# Patient Record
Sex: Female | Born: 1935 | Race: White | Hispanic: No | Smoking: Former smoker
Health system: Southern US, Community
[De-identification: ages and names within clinical notes are randomized; demographics above are authoritative.]

## PROBLEM LIST (undated history)

## (undated) DIAGNOSIS — I4891 Unspecified atrial fibrillation: Secondary | ICD-10-CM

---

## 2008-09-22 ENCOUNTER — Inpatient Hospital Stay (HOSPITAL_COMMUNITY): Admission: EM | Admit: 2008-09-22 | Discharge: 2008-09-28 | Payer: Self-pay | Admitting: Emergency Medicine

## 2008-09-26 ENCOUNTER — Encounter: Payer: Self-pay | Admitting: Orthopedic Surgery

## 2008-09-27 ENCOUNTER — Ambulatory Visit: Payer: Self-pay | Admitting: Physical Medicine & Rehabilitation

## 2008-09-28 ENCOUNTER — Inpatient Hospital Stay (HOSPITAL_COMMUNITY)
Admission: RE | Admit: 2008-09-28 | Discharge: 2008-10-12 | Payer: Self-pay | Admitting: Physical Medicine & Rehabilitation

## 2008-09-28 ENCOUNTER — Ambulatory Visit: Payer: Self-pay | Admitting: Physical Medicine & Rehabilitation

## 2010-11-22 LAB — PROTIME-INR
Prothrombin Time: 32.2 seconds — ABNORMAL HIGH (ref 11.6–15.2)
Prothrombin Time: 32.5 seconds — ABNORMAL HIGH (ref 11.6–15.2)

## 2010-11-27 LAB — CBC
HCT: 30 % — ABNORMAL LOW (ref 36.0–46.0)
HCT: 31.9 % — ABNORMAL LOW (ref 36.0–46.0)
HCT: 36.4 % (ref 36.0–46.0)
Hemoglobin: 10 g/dL — ABNORMAL LOW (ref 12.0–15.0)
Hemoglobin: 10.6 g/dL — ABNORMAL LOW (ref 12.0–15.0)
Hemoglobin: 11.8 g/dL — ABNORMAL LOW (ref 12.0–15.0)
Hemoglobin: 9.6 g/dL — ABNORMAL LOW (ref 12.0–15.0)
Hemoglobin: 9.6 g/dL — ABNORMAL LOW (ref 12.0–15.0)
Hemoglobin: 9.6 g/dL — ABNORMAL LOW (ref 12.0–15.0)
MCHC: 32.8 g/dL (ref 30.0–36.0)
MCHC: 33.2 g/dL (ref 30.0–36.0)
MCHC: 33.4 g/dL (ref 30.0–36.0)
MCHC: 34.1 g/dL (ref 30.0–36.0)
MCV: 78.5 fL (ref 78.0–100.0)
MCV: 78.6 fL (ref 78.0–100.0)
MCV: 78.9 fL (ref 78.0–100.0)
MCV: 79 fL (ref 78.0–100.0)
MCV: 79.5 fL (ref 78.0–100.0)
MCV: 80.2 fL (ref 78.0–100.0)
Platelets: 179 10*3/uL (ref 150–400)
Platelets: 211 10*3/uL (ref 150–400)
Platelets: 265 10*3/uL (ref 150–400)
RBC: 3.76 MIL/uL — ABNORMAL LOW (ref 3.87–5.11)
RBC: 3.8 MIL/uL — ABNORMAL LOW (ref 3.87–5.11)
RBC: 3.88 MIL/uL (ref 3.87–5.11)
RBC: 3.57 MIL/uL — ABNORMAL LOW (ref 3.87–5.11)
RBC: 3.58 MIL/uL — ABNORMAL LOW (ref 3.87–5.11)
RBC: 3.71 MIL/uL — ABNORMAL LOW (ref 3.87–5.11)
RDW: 19 % — ABNORMAL HIGH (ref 11.5–15.5)
RDW: 19.6 % — ABNORMAL HIGH (ref 11.5–15.5)
RDW: 19.9 % — ABNORMAL HIGH (ref 11.5–15.5)
WBC: 7 10*3/uL (ref 4.0–10.5)
WBC: 7 10*3/uL (ref 4.0–10.5)
WBC: 7 10*3/uL (ref 4.0–10.5)
WBC: 7.1 10*3/uL (ref 4.0–10.5)
WBC: 7.7 10*3/uL (ref 4.0–10.5)
WBC: 7.8 10*3/uL (ref 4.0–10.5)

## 2010-11-27 LAB — BASIC METABOLIC PANEL
BUN: 10 mg/dL (ref 6–23)
CO2: 32 mEq/L (ref 19–32)
Calcium: 8.9 mg/dL (ref 8.4–10.5)
Calcium: 9 mg/dL (ref 8.4–10.5)
Chloride: 100 mEq/L (ref 96–112)
Chloride: 102 mEq/L (ref 96–112)
Chloride: 102 mEq/L (ref 96–112)
Chloride: 100 mEq/L (ref 96–112)
Creatinine, Ser: 0.83 mg/dL (ref 0.4–1.2)
Creatinine, Ser: 0.86 mg/dL (ref 0.4–1.2)
Creatinine, Ser: 0.79 mg/dL (ref 0.4–1.2)
Creatinine, Ser: 0.84 mg/dL (ref 0.4–1.2)
GFR calc Af Amer: >60 mL/min (ref >60)
GFR calc Af Amer: >60 mL/min (ref >60)
GFR calc Af Amer: >60 mL/min (ref >60)
GFR calc non Af Amer: 53 mL/min — ABNORMAL LOW (ref >60)
GFR calc non Af Amer: >60 mL/min (ref >60)
GFR calc non Af Amer: >60 mL/min (ref >60)
Glucose, Bld: 130 mg/dL — ABNORMAL HIGH (ref 70–99)
Potassium: 4.1 mEq/L (ref 3.5–5.1)
Potassium: 4.3 mEq/L (ref 3.5–5.1)
Sodium: 135 mEq/L (ref 135–145)
Sodium: 137 mEq/L (ref 135–145)
Sodium: 138 mEq/L (ref 135–145)

## 2010-11-27 LAB — PROTIME-INR
INR: 1.3 (ref 0.00–1.49)
INR: 2 — ABNORMAL HIGH (ref 0.00–1.49)
INR: 1.2 (ref 0.00–1.49)
INR: 1.3 (ref 0.00–1.49)
INR: 1.4 (ref 0.00–1.49)
INR: 1.5 (ref 0.00–1.49)
INR: 1.9 — ABNORMAL HIGH (ref 0.00–1.49)
INR: 2 — ABNORMAL HIGH (ref 0.00–1.49)
INR: 2.4 — ABNORMAL HIGH (ref 0.00–1.49)
INR: 2.7 — ABNORMAL HIGH (ref 0.00–1.49)
INR: 2.8 — ABNORMAL HIGH (ref 0.00–1.49)
Prothrombin Time: 16.6 seconds — ABNORMAL HIGH (ref 11.6–15.2)
Prothrombin Time: 16.4 seconds — ABNORMAL HIGH (ref 11.6–15.2)
Prothrombin Time: 17.5 seconds — ABNORMAL HIGH (ref 11.6–15.2)
Prothrombin Time: 23.7 seconds — ABNORMAL HIGH (ref 11.6–15.2)
Prothrombin Time: 23.9 seconds — ABNORMAL HIGH (ref 11.6–15.2)
Prothrombin Time: 27.3 seconds — ABNORMAL HIGH (ref 11.6–15.2)
Prothrombin Time: 30.9 seconds — ABNORMAL HIGH (ref 11.6–15.2)

## 2010-11-27 LAB — HEPARIN LEVEL (UNFRACTIONATED)
Heparin Unfractionated: 0.36 IU/mL (ref 0.30–0.70)
Heparin Unfractionated: 0.38 IU/mL (ref 0.30–0.70)
Heparin Unfractionated: <0.1 IU/mL — ABNORMAL LOW (ref 0.30–0.70)

## 2010-11-27 LAB — URINALYSIS, ROUTINE W REFLEX MICROSCOPIC
Glucose, UA: NEGATIVE mg/dL
Glucose, UA: NEGATIVE mg/dL
Ketones, ur: NEGATIVE mg/dL
Nitrite: NEGATIVE
Nitrite: POSITIVE — AB
Specific Gravity, Urine: 1.011 (ref 1.005–1.030)
Specific Gravity, Urine: 1.014 (ref 1.005–1.030)
pH: 5.5 (ref 5.0–8.0)
pH: 6 (ref 5.0–8.0)

## 2010-11-27 LAB — DIFFERENTIAL
Basophils Absolute: 0 10*3/uL (ref 0.0–0.1)
Basophils Absolute: 0.1 10*3/uL (ref 0.0–0.1)
Basophils Relative: 1 % (ref 0–1)
Eosinophils Relative: 1 % (ref 0–5)
Eosinophils Relative: 4 % (ref 0–5)
Lymphocytes Relative: 12 % (ref 12–46)
Lymphocytes Relative: 17 % (ref 12–46)
Lymphs Abs: 0.9 10*3/uL (ref 0.7–4.0)
Lymphs Abs: 0.9 10*3/uL (ref 0.7–4.0)
Lymphs Abs: 1.2 10*3/uL (ref 0.7–4.0)
Monocytes Absolute: 0.5 10*3/uL (ref 0.1–1.0)
Monocytes Absolute: 0.7 10*3/uL (ref 0.1–1.0)
Monocytes Absolute: 0.7 10*3/uL (ref 0.1–1.0)
Monocytes Relative: 10 % (ref 3–12)
Monocytes Relative: 9 % (ref 3–12)
Neutro Abs: 5.2 10*3/uL (ref 1.7–7.7)
Neutro Abs: 6.1 10*3/uL (ref 1.7–7.7)
Neutrophils Relative %: 74 % (ref 43–77)
Neutrophils Relative %: 79 % — ABNORMAL HIGH (ref 43–77)

## 2010-11-27 LAB — COMPREHENSIVE METABOLIC PANEL
AST: 20 U/L (ref 0–37)
Alkaline Phosphatase: 64 U/L (ref 39–117)
BUN: 10 mg/dL (ref 6–23)
CO2: 27 mEq/L (ref 19–32)
Calcium: 8.7 mg/dL (ref 8.4–10.5)
Chloride: 106 mEq/L (ref 96–112)
Creatinine, Ser: 0.82 mg/dL (ref 0.4–1.2)
GFR calc Af Amer: >60 mL/min (ref >60)
GFR calc non Af Amer: >60 mL/min (ref >60)
Glucose, Bld: 128 mg/dL — ABNORMAL HIGH (ref 70–99)
Potassium: 4.3 mEq/L (ref 3.5–5.1)
Total Bilirubin: 0.6 mg/dL (ref 0.3–1.2)
Total Protein: 6.1 g/dL (ref 6.0–8.3)

## 2010-11-27 LAB — POCT I-STAT 4, (NA,K, GLUC, HGB,HCT)
Glucose, Bld: 245 mg/dL — ABNORMAL HIGH (ref 70–99)
HCT: 32 % — ABNORMAL LOW (ref 36.0–46.0)
Hemoglobin: 10.9 g/dL — ABNORMAL LOW (ref 12.0–15.0)
Potassium: 3.6 mEq/L (ref 3.5–5.1)

## 2010-11-27 LAB — URINE MICROSCOPIC-ADD ON

## 2010-11-27 LAB — APTT: aPTT: 28 seconds (ref 24–37)

## 2010-11-27 LAB — TSH: TSH: 3.174 u[IU]/mL (ref 0.350–4.500)

## 2010-12-25 NOTE — Discharge Summary (Signed)
NAMEMarland Kitchen  Sara Merritt, Sara Merritt NO.:  [id number]   MEDICAL RECORD NO.:  [id number]          PATIENT TYPE:  INP   LOCATION:  2029                         FACILITY:  [facility]   PHYSICIAN:  Sara Merritt, M.D. DATE OF BIRTH:  1930-01-10   DATE OF ADMISSION:  09/25/2008  DATE OF DISCHARGE:  09/28/2008                               DISCHARGE SUMMARY   DISCHARGE DIAGNOSES:  1. Right trimalleolar equivalent ankle fracture.  2. Ruptured syndesmosis.  3. Possible evulsion posterior left calcaneus inferior aspect without      any additional evidence of left Merritt or ankle injury.  4. Urinary tract infection, cultures pending.   ADDITIONAL DISCHARGE DIAGNOSES:  1. Breast cancer, status post left breast lumpectomy and node      dissection, July 2009. Has had radiation therapy of 32 treatments.  2. Congestive heart failure.  3. Atrial fibrillation with chronic anticoagulation therapy.  4. Dyslipidemia.  5. Hypothyroidism.   PROCEDURE PERFORMED:  1. On 09/26/2008; ORIF with trimalleolar equivalent without fixation      of posterior lip.  2. ORIF right syndesmosis.   BRIEF HISTORY AND HOSPITAL COURSE:  [name] is a very pleasant 77-  year-old female who was originally admitted to the hospital on  09/22/2008 after she sustained a fall when she was attempting to get  into her Sara Merritt at her home when she twisted her right leg which resulted  in a right ankle fracture.  She was brought [facility]  for evaluation which did demonstrate a fracture dislocation of her right  ankle, so she was admitted for opened reduction and internal fixation.  However, given the fact that [name] is on anticoagulation therapy  her INR was significantly elevated on admission with a level of 2.4.  She was admitted and Medicine was consulted to help manage her medical  issues as well as to assist with decreasing the INR to less than 1.5 to  permit safe surgery.  Prior to admission, Ms.  Sara Merritt did undergo closed  reduction in the emergency room of her right ankle dislocation which she  tolerated well.  She was then splinted and admitted to the hospital.  Over the next several days [name] was followed very closely.  Her  INR was followed.  Eventually on hospital day number 4 her INR was at a  reasonable level.  She was permit safe surgery.  On 09/24/2008 her INR  was 1.3.  [name] was covered with heparin to prevent clot as well as  a-fib until surgery could be arranged.  On 09/26/2008 Ms. Nesler was  transferred to [facility] and underwent the procedure described  up above.  [name] tolerated the procedure very well; however, given  her cardiac history we did decide to have her placed postoperatively in  the telemetry unit for continued observation and management of her pain.  [name] hospital stay postoperatively was relatively uncomplicated.  Her heparin was restarted 10 p.m. the night after surgery without  complications.  On postoperative day number 1 it was determined  anticoagulation therapy would be carried out with  Lovenox serving as a  bridge to Coumadin as well.  During her hospital stay [name] was  followed by [facility] which we do greatly appreciate their  assistance with the management of her medical condition.  [name] was  doing very well on postoperative day number 1.  In addition, we did  obtained an inpatient medicine rehab consultation which did demonstrate  that she was a candidate for inpatient rehab and as such the patient  agreed with these findings.  On postoperative day 2 [name] was doing  extremely well, no complaints whatsoever.  The pain in right ankle was  very well controlled on p.o. pain medication.  Her only complaint was  the fact that she did have cam boot on her left Merritt from suspected  injury to her calcaneus on admission.  Clinical encounter __________  postoperative day number 2 is as  follows; subjective/objective the  patient is doing great, complaining of the boot on her left Merritt, no  pain.  The patient is ready to go to rehab.  She is tolerating p.o.  well.  No new complaints are noted.  Vital signs; temperature 98.1,  heart rate 82, respirations 18, and 99% on room air, blood pressure is  115/76.  Laboratory: Sodium 138, potassium 4.5, chloride 100, bicarb 32,  BUN 14, creatinine 0.84, glucose 128.  White blood cells 7.8, hemoglobin  9.6, hematocrit 28.6, platelets 221, INR 1.2.  Her UA did have positive  nitrites and large amount of leukocytes.  General; the patient is awake,  alert, in no acute distress, is comfortable, very pleasant, and is  joking around.  Lungs are clear to auscultation bilaterally. Cardiac;  irregularly irregular.  Abdomen is soft and nontender, positive bowel  sounds.  Right lower extremity splint and dressing was clean, dry, and  intact.  Deep tendon peroneal nerve, superficial peroneal nerve, and  tibial nerve __________  function are intact.  EHL and FHL and motor  functions are intact grossly as well.  No pain with passive motion.  It  does appear that the splint is fitting well.  No evidence of skin  irritation or lesions of the skin both proximally and distally.  Extremities are warm.  Capillary refill is not assessed secondary to the  patient having nails polished.   ASSESSMENT AND PLAN:  This is a 77 year old female status post ORIF,  right ankle fracture, trimalleolar equivalent with ruptured syndesmosis.  1. Weightbearing right lower extremity.  2. Lovenox and Coumadin for DVT prophylaxis and for a-fib.  3. Continue PT OT.  4. We will discharged the patient to rehab today.  5. Medical issues are stable at this time.  Greatly appreciate      Medicine assistance.  6. Follow up in 10 to 14 days.  The patient will most likely required      a short leg cast.  7. Urinary tract infection, Cipro 500 mg p.o. b.i.d. x5 days.    DISCHARGE MEDICATIONS:  1. Norco 5/325 one to two p.o. q.4-6 hours as needed for pain.  2. Arimidex 1 mg at bedtime.  3. Diltiazem CD 240 mg p.o. at bedtime.  4. Furosemide 40 mg p.o. p.r.n.  as needed.  5. Levothyroxine 25 mcg p.o. daily.  6. Robaxin 500 mg p.o. q.6 hours p.r.n.  7. Metoprolol 50 mg p.o. b.i.d.  8. Protonix 40 mg p.o. daily.  9. Simvastatin p.o. at bedtime.  10.Lovenox per pharmacy protocol for DVT prophylaxis and a-fib, should  be at a therapeutic dose, a weight base, and Coumadin per pharmacy      protocol for DVT prophylaxis and a-fib.   DISCHARGE INSTRUCTIONS:  [name] did sustained a significant injury  to her right ankle and it did appear that her medical issues had been  controlled while she has been in the hospital with close monitoring from  the Medicine Service which we greatly appreciate.  [name] will be  nonweightbearing on her right lower extremity for the next 8 weeks or so  with gradual weightbearing thereafter.  After discussion at length with  the patient and her family members I am quite concern that she may be  somewhat not compliant on maintaining her nonweightbearing restrictions,  and as such I will most likely transition [name] to a cast once her  wounds are stabilized.  [name] will be discharged to inpatient rehab  today and she will continue to participate daily with physical therapy,  occupational therapy, and other therapeutic activities as necessary.  I  would anticipate that upon discharge from the inpatient rehabilitation  she will be fairly mobile and may still require some home health and PT  but this will be determined prior to discharge. Again, [name] will  remain on Lovenox and Coumadin, both serving for DVT prophylaxis as well  as for her history of a-fib.  Lovenox will serve as a bridge until her  Coumadin is at the therapeutic level.  [name] will follow up in our  office in about 10 to 14 days at which  time we will remove her splint,  evaluate her surgical wounds, remove the sutures, obtained x-rays to  evaluate her healing and hardware placement and then place her in a  short-leg cast while she continues to remain nonweightbearing.  Should  the inpatient rehab doctors have any questions they can feel free to  contact us as well.  In addition, [name]'s UA on discharge did  demonstrate signs of urinary tract infection, as such we will start her  on Cipro 100 mg p.o. b.i.d. x5 days for treatment.  I feel that this  should efficiently treat the UTI.      Mearl Latin, PA    ______________________________  Sara Homer Sherlean Merritt, M.D.    KWP/MEDQ  D:  09/28/2008  T:  09/28/2008  Job:  [id number]   cc:   Thereasa Distance A. Chaney Malling, M.D.  Oswald Hillock, MD  Jonah Blue, M.D.

## 2010-12-25 NOTE — Op Note (Signed)
NAME:  Sara Merritt, Sara Merritt             ACCOUNT NO.:  [id number]   MEDICAL RECORD NO.:  [id number]          PATIENT TYPE:  INP   LOCATION:  0101                         FACILITY:  [facility]   PHYSICIAN:  Lenard Galloway. Mortenson, M.D.DATE OF BIRTH:  12-30-1929   DATE OF PROCEDURE:  DATE OF DISCHARGE:                               OPERATIVE REPORT   HISTORY:  A very pleasant 77 year old white female who was trying to get  into a [employer] this evening at about 6 o'clock and twisted her right leg,  sustaining a deformity.  Brought to the emergency room at [facility] where she underwent x-rays revealing a fracture dislocation of  the right ankle with marked deformity.  Consultation was asked for by  Dr. Radford Pax and the patient was seen and evaluated.  She had an obvious  external rotation and posterior subluxation of the right ankle requiring  closed reduction.  Discussion with Dr. Radford Pax, he would provide  conscious sedation using etomidate for this procedure.   PROCEDURE NOTE:  The patient was in the emergency room, placed supine  and with oxygen, resuscitation cart, and suction available and full EKG  and pulse oxymetry, Dr. Radford Pax provided relaxation and conscious  sedation using etomidate.  Once he was satisfied with sedation, the  ankle was then reduced by bringing the knee into 90 degrees of flexion  and the posterior subluxation and was reduced and the patient's foot was  internally rotated.  She did not have any problems with this.  Ortho  tech came to splint the patient while it was being held.  Kerlix fluffs  were placed anterior and posterior and the patient was then placed into  a sugar tong splint and posterior splint.  Preoperatively, she had good  sensation and dorsalis pedis pulse, and postreduction, she had 1 to 2+  dorsalis pedis pulse with full sensation and motor.  She awoke on her  own without any difficulties, tolerating the procedure well.      Oris Drone Petrarca,  P.A.-C.      Rodney A. Chaney Malling, M.D.     BDP/MEDQ  D:  09/22/2008  T:  09/22/2008  Job:  [id number]

## 2010-12-25 NOTE — Discharge Summary (Signed)
NAMEMarland Merritt  DEBHORA, [name]:  [id number]   MEDICAL RECORD NO.:  [id number]          PATIENT TYPE:  IPS   LOCATION:  4032                         FACILITY:  [facility]   PHYSICIAN:  Erick Colace, M.D.DATE OF BIRTH:  [birth year]   DATE OF ADMISSION:  09/28/2008  DATE OF DISCHARGE:  10/12/2008                               DISCHARGE SUMMARY   DISCHARGE DIAGNOSES:  1. Right fibular - lateral distal tibia fracture with open reduction      and external fixation September 26, 2008.  2. Left ankle sprain.  3. Chronic atrial fibrillation with Coumadin therapy.  4. Anemia.  5. Hypertension.  6. Hypothyroidism.  7. Hyperlipidemia.  8. Left breast cancer.  9. Congestive heart failure.   This is a 77 year old white female with history of chronic atrial  fibrillation on Coumadin therapy.  He was admitted on September 22, 2008,  after a fall.  No loss of consciousness when she twisted her right leg.  X-ray showed dislocation fracture, right ankle.  INR on admission 2.5  and received vitamin K.  Placed on intravenous heparin.  Underwent open  reduction and external fixation and repair of ruptured syndesmosis on  September 26, 2008, per Dr. Carola Frost.  Nonweightbearing, right lower  extremity.  Heparin changed to subcutaneous Lovenox with Coumadin  resumed September 27, 2008.  Postoperative anemia 9.6 and monitored.  Pain management with Vicodin and Robaxin.  A walking boot was applied to  left lower extremity secondary to ankle sprain with soft tissue swelling  noted.  The patient was admitted for comprehensive rehab program.   PAST MEDICAL HISTORY:  See discharge diagnoses.  Remote smoker.  Occasional alcohol.   ALLERGIES:  None.   SOCIAL HISTORY:  Lives alone in [place].  Local family works  except one daughter that can provide supervision - minimal assistance.  One-level home, seven steps to entry with a plan for a ramp.  The  patient is retired from [employer].   FUNCTIONAL HISTORY:  Prior to admission was independent driving.   FUNCTIONAL STATUS:  Upon admission to rehab services was minimum to  moderate assist mobility.   MEDICATIONS:  Prior to admission were:  1. Coumadin daily.  2. Arimidex 1 mg daily.  3. Cardizem 240 mg daily.  4. Lasix 40 mg as needed for edema.  5. Potassium chloride when Lasix taken.  6. Synthroid 25 mcg daily.  7. Metoprolol 25 mg twice daily.  8. Prilosec 20 mg daily.  9. Zocor 20 mg daily.   PHYSICAL EXAMINATION:  VITAL SIGNS:  Blood pressure 115/76, pulse 82,  temperature 98.1, and respirations 18.  GENERAL:  This is an alert female in no acute distress, oriented x3.  LUNGS:  Clear to auscultation.  CARDIAC:  Rate controlled.  ABDOMEN:  Soft and nontender.  Good bowel sounds.  EXTREMITIES:  Calves remained cool without any swelling or erythema,  nontender.  Sensation intact to light touch. She had a walking boot to  left lower extremity and a bulky cast to the right lower extremity with  neurovascular sensation intact.   REHABILITATION HOSPITAL COURSE:  The patient was admitted to inpatient  rehab services with therapies initiated on a 3-hour daily basis  consisting of physical therapy, occupational therapy, and rehabilitation  nursing.  The following issues were addressed during the patient's  rehabilitation stay.  Pertaining to Ms. Payes's right fibular, lateral,  distal tibia fracture, she had undergone open reduction and external  fixation on September 26, 2008.  She was nonweightbearing to right lower  extremity.  Plan would be to follow with Dr. Carola Frost for a permanent cast  as an outpatient.  She continued with an air cast splint to left lower  extremity for ankle sprain.  She initially was with walking boot.  She  is weightbearing as tolerated.  This did help overall to aid in her  functional mobility.  She remained on chronic Coumadin for atrial  fibrillation with cardiac rate  controlled.  She was followed by Dr.  Joetta Manners at [phone].  A home health nurse had been arranged for  Coumadin therapy.  Her latest INR on October 10, 2008, was 2.9.  She had no  bleeding episodes.  Postoperative anemia remained stable with latest  hemoglobin 10.5, hematocrit 31.6.  Blood pressures monitored with  metoprolol, Cardizem, again with rate controlled.  She remained on  hormone supplement for hypothyroidism.  She had a history of  hyperlipidemia.  She remained on Zocor without issue.  She had a history  of left breast cancer.  She had undergone left lumpectomy as well as  radiation therapy in July 2009.  This was without issue during her  rehabilitation stay.  She exhibited no signs of fluid overload  throughout her rehab stay.  She was voiding without difficulty.  Weekly  collaborative interdisciplinary team conferences were held to discuss  the patient's estimated length of stay, ongoing family teaching, and any  barriers to discharge.  She was overall minimal assist to supervision  for wheelchair mobility, supervision bathing at bed level, minimal  assist lower body dressing, moderate assist toilet transfers, minimal  assist for toileting, no bowel or bladder disturbances.  She was able to  communicate her needs.  Her mobility remained somewhat limited due to  the fact she was nonweightbearing on her right lower extremity and  recent left ankle sprain with air cast in place.  She was discharged to  home with ongoing home therapies.  Latest labs on October 10, 2008, showed  an INR of 2.9.  Latest hemoglobin 10.5, hematocrit of 31.6.  Chemistries  with a sodium 139, potassium 3.6, BUN 11, and creatinine 0.8.  She was  discharged to home with family.   DISCHARGE MEDICATIONS:  At the time of dictation included:  1. Coumadin with latest dose of 2.5 mg adjusted accordingly for an INR      of 2.0-3.0.  2. Zocor 20 mg at bedtime.  3. Arimidex 1 mg at bedtime.  4. Cardizem CD 240  mg daily.  5. Synthroid 25 mcg daily.  6. Lopressor 50 mg twice daily.  7. Protonix 40 mg daily.  8. Vicodin 5/325 one or two tablets every 4 hours as needed pain,      dispense of 90 tablets.  9. Robaxin 500 mg every 6 hours as needed for spasms.   DIET:  Regular.   SPECIAL INSTRUCTIONS:  Non-weightbearing to right leg.  Follow up Dr.  Carola Frost for application of permanent cast as an outpatient.  Air cast  splint to left lower extremity weightbearing as tolerated.  Home health  nurse  to check INR on Friday October 14, 2008, results to Dr. Joetta Manners  at [phone], fax number [phone].  She should follow up with Dr. Carola Frost,  call office for appointment.  Dr. Joetta Manners is ongoing medical  management.  Dr. Claudette Laws as needed rehab services.      Mariam Dollar, P.A.      Erick Colace, M.D.  Electronically Signed    DA/MEDQ  D:  10/11/2008  T:  10/11/2008  Job:  [id number]   cc:   Erick Colace, M.D.  Raynelle Jan, M.D.  Doralee Albino. Carola Frost, M.D.

## 2010-12-25 NOTE — Consult Note (Signed)
NAME:  MARGO, LAMA NO.:  [id number]   MEDICAL RECORD NO.:  [id number]          PATIENT TYPE:  INP   LOCATION:  1409                         FACILITY:  [facility]   PHYSICIAN:  Oswald Hillock, MD        DATE OF BIRTH:  1930/08/05   DATE OF CONSULTATION:  09/22/2008  DATE OF DISCHARGE:                                 CONSULTATION   REASON FOR CONSULTATION:  Planned open reduction and internal fixation,  of the right ankle.   CHIEF COMPLAINT/HISTORY OF PRESENT ILLNESS:  The patient is 77 year old  Caucasian female who presented the emergency room after she fell  twisting her right leg while getting into a van at her home.  She was  noted to have a significant deformity of the right ankle.  Initial  evaluation in the ER revealed fracture dislocation of the right ankle.  The patient was evaluated by Surgery and determined to need open  reduction and internal fixation.  We were consulted to clear her for  surgery and reverse her anticoagulation as she is on Coumadin.  At the  time of this interview, the patient denies any chest pain, shortness of  breath, palpitations, dizziness, diaphoresis, loss of consciousness, or  any focal weakness of any part of the body.  She does, however, have  significant pain in the right lower extremity at the time of this  interview.   PAST MEDICAL HISTORY:  Significant for:  1. Atrial fibrillation.  2. Congestive heart failure.  3. History of left breast cancer, status post lumpectomy and node      dissection and radiation therapy in July of 2009.   PAST SURGICAL HISTORY:  Left breast lumpectomy.   CURRENT MEDICATIONS:  Include:  1. Coumadin.  2. Arimidex.  3. Diltiazem 240 mg.  4. Lasix p.r.n.  5. Potassium p.r.n.  6. Levothyroxine 25 mcg daily.  7. Metoprolol 25 mg b.i.d.  8. Omeprazole 20 mg daily.  9. Simvastatin 20 mg q.h.s.   REVIEW OF SYSTEMS:  An extensive review of systems is done.  All systems  are negative except for  the positives mentioned in the history of  present illness.   ALLERGIES:  No known drug allergies.   SOCIAL HISTORY:  The patient quit smoking greater than 30 years ago, had  about a 20-pack year history of smoking prior to that.  Drinks alcohol  socially.  No history of drug use.  She is independent in all her ADLs  at baseline.   PHYSICAL EXAMINATION:  VITAL SIGNS: Vitals on admission, pulse 112,  blood pressure 103/72, respiratory rate 14, and temperature 98.5.  GENERAL: An elderly Caucasian female in no acute distress, alert and  oriented to time, place, and person.  HEENT: No scleral icterus.  No pallor.  Ears negative.  Poor dental  hygiene.  NECK: Supple.  No lymphadenopathy.  No JVD.  CHEST: Breath sounds heard bilaterally.  Good air entry.  Occasional  rhonchi.  CVS: S1 and S2 plus.  Regular.  No gallop or rub.  Positive systolic  murmur.  ABDOMEN: Soft, nontender, and nondistended.  Bowel sounds are present.  EXTREMITIES: Right lower extremity is in a splint at the time of this  examination.  No edema in the left lower extremity.  NEUROLOGIC: Cranial nerves II through XII appear grossly intact.  No  focal motor or sensory deficits noted on gross examination.   LABORATORY DATA:  Her sodium was 135, potassium 4.3, chloride 102, CO2  25, glucose 130, BUN 14, and creatinine 1.01.  Calcium of 9.  Her WBC  count is 7.5, hemoglobin 11.8, hematocrit 36.4, platelet count of  222,000.  Her PT is 27.9 and INR 2.4.  Chest x-ray shows no acute  process.  X-ray of the right ankle showed fracture dislocation of the  ankle with posterior subluxation of the talus.  EKG pending.   IMPRESSION AND PLAN:  This is the case of a 77 year old Caucasian female  with history of atrial fibrillation, congestive heart failure, and  breast cancer who presents status post fall with her right ankle  fracture necessitating an open reduction and internal fixation.  1. Preoperative clearance.  Given the  patient's multiple medical      problems including atrial fibrillation, congestive heart failure,      and her age, she is moderate to high risk for any surgical      procedure.  A major issue with her is her anticoagulation, which we      need to reverse at this point of time.  As she has received her      Coumadin dose today, we will go ahead and give her a low dose of      vitamin K to have her PT/INR in the acceptable range for possible      surgical procedure in the morning.  She will need bridging with      either heparin or subcutaneous Lovenox during the perioperative      period.  We will review her INR in the morning prior to deciding      that.  In case her INR is still elevated in the morning, there is      always option of using frozen plasma to go ahead with the surgical      procedure.  We will need to get an EKG on her prior to any surgical      clearance.  Perioperative beta blockers will be continued and she      will need to be continued on the calcium channel blocker as well,      as it is a pretty significant dose and any discontinuation at this      time may lead to rapid ventricular response.  We will also check a      BNP as she does have history of congestive heart failure.  She uses      Lasix only on a p.r.n. basis; therefore, fluid management will need      to be monitored closely and Lasix needed with any blood      transfusion.  Her electrolytes and renal function is within normal      limits and we will continue to monitor that as well.  The patient      will need early ambulation postop and incentive spirometry.  2. Atrial fibrillation.  Rate is in the 110's at this time.  We will      go ahead and give her the dose of beta blocker that she is      scheduled and monitor her rate as mentioned  above and EKG will need      to be done and followed up on.  3. Congestive heart failure, compensated.  We will check a BNP and      follow up with the results.  Use  Lasix p.r.n.  4. Deep venous thrombosis/gastrointestinal prophylaxis.  Protonix and      compression devices for now.  However, the patient will need      anticoagulation maintained in the perioperative and postoperative      stage.      Oswald Hillock, MD  Electronically Signed     BA/MEDQ  D:  09/22/2008  T:  09/23/2008  Job:  [id number]   cc:   Primary Care Physician  Rosalita Levan, [place]   Orthopedic Physician  [place], [place]

## 2010-12-25 NOTE — Op Note (Signed)
NAME:  CANDENCE, SEASE NO.:  [id number]   MEDICAL RECORD NO.:  [id number]           PATIENT TYPE:   LOCATION:                                 FACILITY:   PHYSICIAN:  Doralee Albino. Carola Frost, M.D. DATE OF BIRTH:  11-Nov-1929   DATE OF PROCEDURE:  09/26/2008  DATE OF DISCHARGE:                               OPERATIVE REPORT   PREOPERATIVE DIAGNOSES:  1. Right trimalleolar equivalent fracture.  2. Ruptured syndesmosis.   POSTOPERATIVE DIAGNOSES:  1. Right trimalleolar equivalent fracture.  2. Ruptured syndesmosis.   PROCEDURES:  1. Repair of trimalleolar equivalent without fixation of posterior      lip.  2. ORIF of syndesmosis.   SURGEON:  Doralee Albino. Carola Frost, MD   ASSISTANTS:  1. Mearl Latin, PA  2. Mark C. Ophelia Charter, MD   ANESTHESIA:  General.   COMPLICATIONS:  None.   TOTAL TOURNIQUET TIME:  None.   DISPOSITION:  PACU.   CONDITION:  Stable.   BRIEF SUMMARY AND INDICATIONS FOR PROCEDURE:  Carolene Gitto is a 77-  year-old female who sustained a right ankle fracture getting out her  Zenaida Niece.  She was initially seen and evaluated by Dr. Georgena Spurling who was  quite concerned about the atypical pattern and obtained a CT scan  demonstrating a large anterolateral displaced fragment of significant  posterior comminution along the malleolus and a comminuted lateral Weber  C malleolus as well.  I was consulted because of the complexity and  agreed to assume management from Dr. Sherlean Foot.  I discussed with Ms. Ladona Ridgel  preoperative risks and benefits of surgery including the possibility of  infection, nerve injury, vessel injury, need for further surgery, DVT,  PE, heart attack, stroke, and other perioperative complications.  After  full discussion, she wished to proceed.   BRIEF DESCRIPTION OF PROCEDURE:  Ms. Corbridge was taken to the operating  room where general anesthesia was induced.  Her right lower extremity  was prepped and draped in the usual sterile fashion.   Tourniquet was  placed about her thigh, but never inflated during the procedure.  Distally, I began with the 2-cm anterolateral incision and carried  dissection carefully down retracting the superficial peroneal nerve  anteriorly revealing the fracture site and the tibial plafond.  This was  cleaned with a curette and lavage, reduced under direct visualization  and fixed with a 3.5 lag screw in an anatomic position with excellent  purchase and compression.  A separate posterolateral incision over the  fibula was then made, dissection carried down to the periosteal layer  which was protected.  Superficial peroneal nerve again was retracted  anteriorly.  The fracture site was cleaned with a curette and reduced.  Unfortunately because of the posterior comminution at the fracture site,  we were able to key in reduction, but not use a lag screw to maintain  it.  Consequently, the screw was affixed to the plate distally and then  the plate used to reduce the fracture site and achieve temporally  stability with respect to the proximal piece.  At that time, I then  placed 2  additional screws under compression securing the reduction and  compression.  I removed one of the distal fibular screws and placed it  in the sharp tenaculum through a small stab incision medially and into  the head of the screw over the plate laterally.  This produced an  anatomic reduction of the syndesmosis which was then secured with a  fully-threaded 4.0 cancellous screw.  The wound was irrigated.  There  was 2 screws of fixation in each fragment.  The anterolateral fracture  had then reduced and the syndesmosis reduced and fixed as well.  Again  at that time, I irrigated and obtained AP mortise and lateral x-rays of  the ankle to confirm the reduction and appropriate hardware placement.  After completing the reduction, obtaining critical fixation, and  confirming it radiographically, only two screw holes within the  plate  remained to be filled.  Dr. Annell Greening was able to assist by presiding  over this terminal portion of the procedure, including placement of the  last screw and a standard layered closure performed with 2-0 Vicryl and  3-0 nylon.  A sterile gently compressive dressing posterior and stirrup  splint was applied.  The patient was awakened from anesthesia and  transported to the PACU in stable condition.  Mearl Latin, PA,  primarily assisted throughout the procedure with retraction, holding of  reduction, and assistance with instrumentation and placement of  hardware.  He was essential to the safe and effective completion of the  case.   PROGNOSIS:  Ms. Koziel will remain on the diltiazem drip for AFib and  tachycardia that was rate controlled with that intervention.  Cardiology  also was following and was consulted and was only going to evaluate and  assist well.  She will be going to a cardiac step-down unit after  discharge from the PACU.  With regard to her ankle, she will be non-  weightbearing for the next 8 weeks with gradual weightbearing  thereafter.  After discussion with the patient and the family, I am  concerned about noncompliance and as such we will need to keep her in a  cast.  I have to remove her sutures in 2 weeks and keep her casted until  she is able to bear weight at 8 weeks.  Her bone quality is consistent  with osteoporosis and this further increases the risk of loss of  fixation.  She will be on DVT prophylaxis.   Dictation ended at this point.      Doralee Albino. Carola Frost, M.D.  Electronically Signed     MHH/MEDQ  D:  09/26/2008  T:  09/27/2008  Job:  [id number]

## 2010-12-25 NOTE — Discharge Summary (Signed)
NAMEMarland Merritt  [name], DOMZALSKI NO.:  [id number]   MEDICAL RECORD NO.:  [id number]          PATIENT TYPE:  IPS   LOCATION:  4032                         FACILITY:  [facility]   PHYSICIAN:  Doralee Albino. Carola Frost, M.D. DATE OF BIRTH:  May 21, 1930   DATE OF ADMISSION:  09/28/2008  DATE OF DISCHARGE:                               DISCHARGE SUMMARY   ADDENDUM   I just note that after review of the plain films obtained on initial  admission, there is a possible avulsion to the superior aspect of the  calcaneus; however, the patient does not have any clinical correlation  with these findings.  She does complain that the boot is irritating, as  such we will allow her to weight bear as tolerated on her left lower  extremity and we will add Aircast for additional support if she needs  it.  If the patient does begin to experience increased pain, I would  advise to go back to the CAM boot and contact our office to make [place]  aware as well.  Also in addition, her medication should also include  ciprofloxacin 500 mg p.o. b.i.d. x5 days for urinary tract infection.      Mearl Latin, PA      Doralee Albino. Carola Frost, M.D.  Electronically Signed    KWP/MEDQ  D:  09/28/2008  T:  09/29/2008  Job:  [id number]   cc:   Jonah Blue, M.D.  Rodney A. Chaney Malling, M.D.  Oswald Hillock, MD

## 2010-12-25 NOTE — H&P (Signed)
NAMEMarland Merritt  CHIANA, WAMSER NO.:  [id number]   MEDICAL RECORD NO.:  [id number]          PATIENT TYPE:  IPS   LOCATION:  4032                         FACILITY:  [facility]   PHYSICIAN:  Erick Colace, M.D.DATE OF BIRTH:  09-03-1929   DATE OF ADMISSION:  09/28/2008  DATE OF DISCHARGE:                              HISTORY & PHYSICAL   CHIEF COMPLAINT:  Right and left ankle pain.   HISTORY OF PRESENT ILLNESS:  A 77 year old female with chronic atrial  fibrillation, who has been on chronic Coumadin, was admitted on September 22, 2008, to the [facility] after a fall where she  twisted her right leg.  She had no loss of consciousness.  X-ray showed  dislocation of the tibia upon the talus as well as a trimalleolar  fracture including the fibular component as well as a medial malleolar  tip.  She was placed on vitamin K, placed on IV heparin, then underwent  ORIF, and repair of ruptured syndesmosis by Dr. Carola Frost with the  assistance of Dr. Ophelia Charter.  The patient was made nonweightbearing to the  right lower extremity with cast in the right lower extremity.  Her  heparin was changed to subcu Lovenox at 1 mg/kg dose with Coumadin  resume per pharmacy protocol on September 27, 2008.  She had  postoperative anemia with a hemoglobin 9.6, and this has been monitored  closely.  Pain control has been adequate through the use of Vicodin and  Robaxin.  She has a CAM Walker in the left lower extremity because of a  left ankle sprain, which occurred at the same time as the fracture.  There was a question of a small avulsion fragment on the calcaneus.   Physical Medicine Rehabilitation physician was consulted, and the  patient was felt to be a good rehabilitation candidate.   REVIEW OF SYSTEMS:  Positive for palpitations.   PAST HISTORY:  Positive for chronic atrial fibrillation, congestive  heart failure, left breast cancer with lumpectomy and node resection as  well as  XRT in July 2009, hypothyroidism, hypertension, and  hyperlipidemia.   HABITS:  Remote tobacco and occasional alcohol use.   FAMILY HISTORY:  Coronary artery disease.   SOCIAL HISTORY:  Lives alone in [place].  Local family works,  she has 1 daughter, who can care for her, who was involved in motor  vehicle accident, but can only use her right arm to assist her.  That  can provide supervision to min assist level.  One-level home, 7-step  entry with a plan for a ramp.  The patient retired in October 2009 from  [employer].   FUNCTIONAL HISTORY:  Has been independent and driving prior to  admission.   HOME MEDICATIONS:  1. Coumadin.  2. Arimidex 1 mg p.o. daily.  3. Diltiazem 240 mg p.o. daily.  4. Lasix 40 mg p.o. p.r.n. leg edema.  5. Klor-Con p.r.n.  6. Synthroid 25 mcg daily.  7. Metoprolol 25 mg p.o. b.i.d.  8. Omeprazole 20 mg p.o. daily.  9. Simvastatin 20 mg p.o. nightly.   CURRENT MEDICATIONS:  1. Arimidex 1 mg p.o. nightly.  2. Cardizem CD 240 mg p.o. daily.  3. Subcu Lovenox 75 mg q.12 h. until INR greater than 2.0.  4. Synthroid 25 mcg p.o. daily.  5. Lopressor 50 mg p.o. b.i.d.  6. Protonix 40 mg p.o. daily.  7. Zocor 20 mg p.o. daily.  8. Robaxin 500 mg p.o. q.6 h.   LABORATORIES:  Last INR 1.2.  Urinalysis:  White count too numerous to  count with culture pending.  Last BUN 14, creatinine 0.8, sodium 138,  and potassium 4.5.  Last hemoglobin 9.6 with white count of 7.8 and  platelets 221,000.   PHYSICAL EXAMINATION:  GENERAL:  Obese elderly female, in no acute  stress.  VITAL SIGNS:  Blood pressure 115/96, pulse 82, respirations 18, and  temperature 98.1.  EYES:  Anicteric, noninjected.  Extraocular muscles intact.  ENT:  Intact hearing.  Intact oral.  No evidence of trauma in the facial  area.  NECK:  Supple without adenopathy.  Range of motion is full without pain.  RESPIRATION:  Normal effort.  No tenderness to palpation in the chest   area.  Auscultation is normal.  HEART:  Irregularly irregular.  Pulses are intact, femoral and pedal.  EXTREMITIES:  No evidence of edema other than the toes on the right,  which is mild.  She does have a right short leg cast as well as a left  CAM Walker.  NEUROLOGIC:  Her motor strength is 5/5 in bilateral deltoid, biceps, and  triceps.  Finger flexor 3-, at the hip flexors 3-, at the quad cannot  test TA or gastroc.  Cranial nerves II through XII intact.  Deep tendon  reflexes are normal.  Sensation is intact.  Orientation x3.  Mood,  memory, and affect are all normal.  ABDOMEN:  No organomegaly.  No bruits.  No masses.  Nontender to  palpation.   POSTADMISSION PHYSICIAN EVALUATION:  1. Functional deficits secondary to right trimalleolar fracture with      nonweightbearing as well as left ankle sprain.  She has additional      comorbidities of chronic atrial fibrillation, requiring chronic      Coumadin, and she is currently in a transition between Lovenox and      Coumadin.  Furthermore, she has anemia, which needs to be monitored      now that she is on anticoagulation.  She has a history of CHF.  We      will need to monitor for signs of cardiac decompensation.  2. The patient is admitted to receive collaborative interdisciplinary      care between physiatrist, rehab nursing staff, and therapy team.  3. The patient's level of medical complexity and substantial therapy      needs in contact, so that medical necessity cannot be provided at a      lesser intensive care such as a nursing facility.  4. The patient experienced substantial functional loss from her      baseline.  Upon functional assessment at the time of preadmission      screening, the patient was requiring at least a mod assist level      for ADLs and mobility.  Judging by the patient's diagnosis,      physical exam, and functional history, the patient has a potential      for functional progress, which will result in  measurable gain while      an inpatient rehabilitation.  These gains will be of  substantial      and practically use upon discharge to home in facilitating      mobility, self-care, etc.  Interim changes in medical status since      preadmission screening are detailed in the history of present      illness.  5. Physiatrist will provide 24-hour management of medical needs as      well as oversight of therapy plan/treatment and provide guidance as      appropriate regarding the interaction of the 2.  Medical problem      list is listed below.  6. A 24-hour rehab nursing will assist in the management of bowel      management, bladder management, pain management, underlying disease      assessment, management, and monitoring, and help integrate therapy      concepts, techniques, and education for the patient.  7. PT will assess and treat for decline in ambulation, transfers, and      standing tolerance.  Goals are to achieve a supervision to min      assist level with transfers sitting, standing balance, ambulation      using appropriate assist device.  8. OT will assess and treat for decline in dressing, bathing, and      toileting.  Goals are for min assist to supervision level for      dressing, bathing, grooming, hygiene, and toileting.  9. Case Management and social worker will assess and treat for      psychosocial issues and discharge planning.  10.A team conference will be held weekly to assess the patient      progress and goals and to determine barriers to discharge.  11.The patient has demonstrated sufficient medical stability and      exercise capacity to tolerate at least 3 hours of therapy per day      at least 5 days per week.  12.Estimated length of stay is 1-2 weeks.   PROGNOSIS FOR FUNCTIONAL IMPROVEMENT:  Good.   MEDICAL PROBLEM LIST AND PLAN:  1. Functional deficits due to right fibular/lateral distal tibial,      femur fracture status post open reduction and  internal fixation on      September 26, 2008.  Furthermore, the patient has a left ankle      sprain, which requires walking boot for least 2 weeks.  This will      further limit mobility and increase the patient's reliance on upper      extremity strength, which will need to be built up to use      appropriate assistive device.  PT will do range of motion,      strength, bed mobility transfers, pre-gait training, gait training,      and equipment.  OT will do range of motion, strengthening, ADLs,      cognitive/perceptual training, splinting, as well as equipment.   Rehab Nursing will do skin care, wound care, bowel and bladder training.  Case Management will assist home environment issues with discharge  planning and arrange for followup care.  Social work will assess family  and social support consultation related to disability issues and assist  in discharge planning.  1. Chronic atrial fibrillation.  The patient is now in the process of      converting from Lovenox to her oral Coumadin.  2. Anemia.  She has postop anemia, but we need to monitor her      hemoglobin and hematocrit now that her anticoagulation has  been      restarting and intensified and potential for hematoma formation      exist.  3. Hypertension.  She is on Cardizem and Lopressor.  We will need to      monitor her blood pressure.  She is at risk for some decline in her      oral intake, which may cause some hypotension as well as      tachycardia, need to monitor for her intake, and have nursing      assistance with this.  4. Hypothyroidism.  We will continue her on Synthroid.  5. Hyperlipidemia.  We will continue on Zocor.  6. Left breast cancer.  This is not currently an active issue, we will      monitor for any signs of recurrence.   Rehab has been discussed with the patient and family their cognizant of  the plan.      Erick Colace, M.D.  Electronically Signed     AEK/MEDQ  D:  09/28/2008  T:   09/28/2008  Job:  [id number]   cc:   Raynelle Jan, M.D.  Doralee Albino. Carola Frost, M.D.  Baldo Daub, MD

## 2014-09-14 DIAGNOSIS — I4891 Unspecified atrial fibrillation: Secondary | ICD-10-CM | POA: Diagnosis not present

## 2014-10-03 DIAGNOSIS — E785 Hyperlipidemia, unspecified: Secondary | ICD-10-CM | POA: Diagnosis not present

## 2014-10-03 DIAGNOSIS — I4891 Unspecified atrial fibrillation: Secondary | ICD-10-CM | POA: Diagnosis not present

## 2014-10-03 DIAGNOSIS — E0869 Diabetes mellitus due to underlying condition with other specified complication: Secondary | ICD-10-CM | POA: Diagnosis not present

## 2014-10-03 DIAGNOSIS — N183 Chronic kidney disease, stage 3 (moderate): Secondary | ICD-10-CM | POA: Diagnosis not present

## 2014-10-03 DIAGNOSIS — E782 Mixed hyperlipidemia: Secondary | ICD-10-CM | POA: Diagnosis not present

## 2014-10-03 DIAGNOSIS — E119 Type 2 diabetes mellitus without complications: Secondary | ICD-10-CM | POA: Diagnosis not present

## 2014-10-03 DIAGNOSIS — I129 Hypertensive chronic kidney disease with stage 1 through stage 4 chronic kidney disease, or unspecified chronic kidney disease: Secondary | ICD-10-CM | POA: Diagnosis not present

## 2014-10-11 DIAGNOSIS — I361 Nonrheumatic tricuspid (valve) insufficiency: Secondary | ICD-10-CM | POA: Diagnosis not present

## 2014-10-11 DIAGNOSIS — I700 Atherosclerosis of aorta: Secondary | ICD-10-CM | POA: Diagnosis not present

## 2014-10-11 DIAGNOSIS — I340 Nonrheumatic mitral (valve) insufficiency: Secondary | ICD-10-CM | POA: Diagnosis not present

## 2014-10-11 DIAGNOSIS — I4891 Unspecified atrial fibrillation: Secondary | ICD-10-CM | POA: Diagnosis not present

## 2014-10-11 DIAGNOSIS — I350 Nonrheumatic aortic (valve) stenosis: Secondary | ICD-10-CM | POA: Diagnosis not present

## 2014-10-17 DIAGNOSIS — I129 Hypertensive chronic kidney disease with stage 1 through stage 4 chronic kidney disease, or unspecified chronic kidney disease: Secondary | ICD-10-CM | POA: Diagnosis not present

## 2014-10-17 DIAGNOSIS — I517 Cardiomegaly: Secondary | ICD-10-CM | POA: Diagnosis not present

## 2014-10-17 DIAGNOSIS — E0869 Diabetes mellitus due to underlying condition with other specified complication: Secondary | ICD-10-CM | POA: Diagnosis not present

## 2014-10-17 DIAGNOSIS — E785 Hyperlipidemia, unspecified: Secondary | ICD-10-CM | POA: Diagnosis not present

## 2014-10-17 DIAGNOSIS — I4891 Unspecified atrial fibrillation: Secondary | ICD-10-CM | POA: Diagnosis not present

## 2014-11-15 DIAGNOSIS — H35363 Drusen (degenerative) of macula, bilateral: Secondary | ICD-10-CM | POA: Diagnosis not present

## 2014-11-15 DIAGNOSIS — H25812 Combined forms of age-related cataract, left eye: Secondary | ICD-10-CM | POA: Diagnosis not present

## 2014-11-17 DIAGNOSIS — I4891 Unspecified atrial fibrillation: Secondary | ICD-10-CM | POA: Diagnosis not present

## 2014-12-02 DIAGNOSIS — E0869 Diabetes mellitus due to underlying condition with other specified complication: Secondary | ICD-10-CM | POA: Diagnosis not present

## 2014-12-02 DIAGNOSIS — E785 Hyperlipidemia, unspecified: Secondary | ICD-10-CM | POA: Diagnosis not present

## 2014-12-06 DIAGNOSIS — H2512 Age-related nuclear cataract, left eye: Secondary | ICD-10-CM | POA: Diagnosis not present

## 2014-12-06 DIAGNOSIS — E785 Hyperlipidemia, unspecified: Secondary | ICD-10-CM | POA: Diagnosis not present

## 2014-12-06 DIAGNOSIS — E039 Hypothyroidism, unspecified: Secondary | ICD-10-CM | POA: Diagnosis not present

## 2014-12-06 DIAGNOSIS — H25812 Combined forms of age-related cataract, left eye: Secondary | ICD-10-CM | POA: Diagnosis not present

## 2014-12-06 DIAGNOSIS — I4891 Unspecified atrial fibrillation: Secondary | ICD-10-CM | POA: Diagnosis not present

## 2014-12-06 DIAGNOSIS — H259 Unspecified age-related cataract: Secondary | ICD-10-CM | POA: Diagnosis not present

## 2014-12-06 DIAGNOSIS — I10 Essential (primary) hypertension: Secondary | ICD-10-CM | POA: Diagnosis not present

## 2014-12-06 DIAGNOSIS — E119 Type 2 diabetes mellitus without complications: Secondary | ICD-10-CM | POA: Diagnosis not present

## 2014-12-06 DIAGNOSIS — J449 Chronic obstructive pulmonary disease, unspecified: Secondary | ICD-10-CM | POA: Diagnosis not present

## 2014-12-06 DIAGNOSIS — Z87891 Personal history of nicotine dependence: Secondary | ICD-10-CM | POA: Diagnosis not present

## 2014-12-06 DIAGNOSIS — Z7901 Long term (current) use of anticoagulants: Secondary | ICD-10-CM | POA: Diagnosis not present

## 2014-12-06 DIAGNOSIS — Z79899 Other long term (current) drug therapy: Secondary | ICD-10-CM | POA: Diagnosis not present

## 2014-12-06 DIAGNOSIS — K219 Gastro-esophageal reflux disease without esophagitis: Secondary | ICD-10-CM | POA: Diagnosis not present

## 2014-12-09 DIAGNOSIS — I4891 Unspecified atrial fibrillation: Secondary | ICD-10-CM | POA: Diagnosis not present

## 2015-01-03 DIAGNOSIS — I10 Essential (primary) hypertension: Secondary | ICD-10-CM | POA: Diagnosis not present

## 2015-01-03 DIAGNOSIS — E119 Type 2 diabetes mellitus without complications: Secondary | ICD-10-CM | POA: Diagnosis not present

## 2015-01-03 DIAGNOSIS — Z7901 Long term (current) use of anticoagulants: Secondary | ICD-10-CM | POA: Diagnosis not present

## 2015-01-03 DIAGNOSIS — I4891 Unspecified atrial fibrillation: Secondary | ICD-10-CM | POA: Diagnosis not present

## 2015-01-03 DIAGNOSIS — K219 Gastro-esophageal reflux disease without esophagitis: Secondary | ICD-10-CM | POA: Diagnosis not present

## 2015-01-03 DIAGNOSIS — H25811 Combined forms of age-related cataract, right eye: Secondary | ICD-10-CM | POA: Diagnosis not present

## 2015-01-03 DIAGNOSIS — E785 Hyperlipidemia, unspecified: Secondary | ICD-10-CM | POA: Diagnosis not present

## 2015-01-03 DIAGNOSIS — E039 Hypothyroidism, unspecified: Secondary | ICD-10-CM | POA: Diagnosis not present

## 2015-01-03 DIAGNOSIS — Z79899 Other long term (current) drug therapy: Secondary | ICD-10-CM | POA: Diagnosis not present

## 2015-01-03 DIAGNOSIS — J449 Chronic obstructive pulmonary disease, unspecified: Secondary | ICD-10-CM | POA: Diagnosis not present

## 2015-01-03 DIAGNOSIS — H259 Unspecified age-related cataract: Secondary | ICD-10-CM | POA: Diagnosis not present

## 2015-01-13 DIAGNOSIS — I4891 Unspecified atrial fibrillation: Secondary | ICD-10-CM | POA: Diagnosis not present

## 2015-01-30 DIAGNOSIS — E1369 Other specified diabetes mellitus with other specified complication: Secondary | ICD-10-CM | POA: Diagnosis not present

## 2015-01-30 DIAGNOSIS — E785 Hyperlipidemia, unspecified: Secondary | ICD-10-CM | POA: Diagnosis not present

## 2015-01-30 DIAGNOSIS — I129 Hypertensive chronic kidney disease with stage 1 through stage 4 chronic kidney disease, or unspecified chronic kidney disease: Secondary | ICD-10-CM | POA: Diagnosis not present

## 2015-02-06 DIAGNOSIS — E1169 Type 2 diabetes mellitus with other specified complication: Secondary | ICD-10-CM | POA: Diagnosis not present

## 2015-02-06 DIAGNOSIS — E782 Mixed hyperlipidemia: Secondary | ICD-10-CM | POA: Diagnosis not present

## 2015-02-06 DIAGNOSIS — E1369 Other specified diabetes mellitus with other specified complication: Secondary | ICD-10-CM | POA: Diagnosis not present

## 2015-02-06 DIAGNOSIS — E785 Hyperlipidemia, unspecified: Secondary | ICD-10-CM | POA: Diagnosis not present

## 2015-02-20 DIAGNOSIS — I4891 Unspecified atrial fibrillation: Secondary | ICD-10-CM | POA: Diagnosis not present

## 2015-02-20 DIAGNOSIS — E039 Hypothyroidism, unspecified: Secondary | ICD-10-CM | POA: Diagnosis not present

## 2015-03-23 DIAGNOSIS — I4891 Unspecified atrial fibrillation: Secondary | ICD-10-CM | POA: Diagnosis not present

## 2015-04-24 DIAGNOSIS — I4891 Unspecified atrial fibrillation: Secondary | ICD-10-CM | POA: Diagnosis not present

## 2015-05-03 DIAGNOSIS — E1369 Other specified diabetes mellitus with other specified complication: Secondary | ICD-10-CM | POA: Diagnosis not present

## 2015-05-03 DIAGNOSIS — E1169 Type 2 diabetes mellitus with other specified complication: Secondary | ICD-10-CM | POA: Diagnosis not present

## 2015-05-03 DIAGNOSIS — I129 Hypertensive chronic kidney disease with stage 1 through stage 4 chronic kidney disease, or unspecified chronic kidney disease: Secondary | ICD-10-CM | POA: Diagnosis not present

## 2015-05-09 DIAGNOSIS — Z139 Encounter for screening, unspecified: Secondary | ICD-10-CM | POA: Diagnosis not present

## 2015-05-09 DIAGNOSIS — Z1389 Encounter for screening for other disorder: Secondary | ICD-10-CM | POA: Diagnosis not present

## 2015-05-09 DIAGNOSIS — Z9181 History of falling: Secondary | ICD-10-CM | POA: Diagnosis not present

## 2015-05-09 DIAGNOSIS — Z0000 Encounter for general adult medical examination without abnormal findings: Secondary | ICD-10-CM | POA: Diagnosis not present

## 2015-05-09 DIAGNOSIS — E1169 Type 2 diabetes mellitus with other specified complication: Secondary | ICD-10-CM | POA: Diagnosis not present

## 2015-05-09 DIAGNOSIS — E782 Mixed hyperlipidemia: Secondary | ICD-10-CM | POA: Diagnosis not present

## 2015-05-09 DIAGNOSIS — I129 Hypertensive chronic kidney disease with stage 1 through stage 4 chronic kidney disease, or unspecified chronic kidney disease: Secondary | ICD-10-CM | POA: Diagnosis not present

## 2015-05-09 DIAGNOSIS — N183 Chronic kidney disease, stage 3 (moderate): Secondary | ICD-10-CM | POA: Diagnosis not present

## 2015-05-24 DIAGNOSIS — I4891 Unspecified atrial fibrillation: Secondary | ICD-10-CM | POA: Diagnosis not present

## 2015-05-24 DIAGNOSIS — Z23 Encounter for immunization: Secondary | ICD-10-CM | POA: Diagnosis not present

## 2015-05-26 DIAGNOSIS — I4891 Unspecified atrial fibrillation: Secondary | ICD-10-CM | POA: Diagnosis not present

## 2015-06-15 DIAGNOSIS — I4891 Unspecified atrial fibrillation: Secondary | ICD-10-CM | POA: Diagnosis not present

## 2015-06-19 DIAGNOSIS — C50312 Malignant neoplasm of lower-inner quadrant of left female breast: Secondary | ICD-10-CM | POA: Diagnosis not present

## 2015-06-19 DIAGNOSIS — R921 Mammographic calcification found on diagnostic imaging of breast: Secondary | ICD-10-CM | POA: Diagnosis not present

## 2015-06-26 DIAGNOSIS — I4891 Unspecified atrial fibrillation: Secondary | ICD-10-CM | POA: Diagnosis not present

## 2015-06-27 DIAGNOSIS — Z79811 Long term (current) use of aromatase inhibitors: Secondary | ICD-10-CM | POA: Diagnosis not present

## 2015-06-27 DIAGNOSIS — Z853 Personal history of malignant neoplasm of breast: Secondary | ICD-10-CM | POA: Diagnosis not present

## 2015-06-28 DIAGNOSIS — I4891 Unspecified atrial fibrillation: Secondary | ICD-10-CM | POA: Diagnosis not present

## 2015-07-12 DIAGNOSIS — I4891 Unspecified atrial fibrillation: Secondary | ICD-10-CM | POA: Diagnosis not present

## 2015-08-10 DIAGNOSIS — I4891 Unspecified atrial fibrillation: Secondary | ICD-10-CM | POA: Diagnosis not present

## 2015-09-01 DIAGNOSIS — E039 Hypothyroidism, unspecified: Secondary | ICD-10-CM | POA: Diagnosis not present

## 2015-09-01 DIAGNOSIS — E1369 Other specified diabetes mellitus with other specified complication: Secondary | ICD-10-CM | POA: Diagnosis not present

## 2015-09-01 DIAGNOSIS — E1169 Type 2 diabetes mellitus with other specified complication: Secondary | ICD-10-CM | POA: Diagnosis not present

## 2015-09-01 DIAGNOSIS — I129 Hypertensive chronic kidney disease with stage 1 through stage 4 chronic kidney disease, or unspecified chronic kidney disease: Secondary | ICD-10-CM | POA: Diagnosis not present

## 2015-09-05 DIAGNOSIS — E782 Mixed hyperlipidemia: Secondary | ICD-10-CM | POA: Diagnosis not present

## 2015-09-05 DIAGNOSIS — Z6841 Body Mass Index (BMI) 40.0 and over, adult: Secondary | ICD-10-CM | POA: Diagnosis not present

## 2015-09-05 DIAGNOSIS — M109 Gout, unspecified: Secondary | ICD-10-CM | POA: Diagnosis not present

## 2015-09-05 DIAGNOSIS — E1169 Type 2 diabetes mellitus with other specified complication: Secondary | ICD-10-CM | POA: Diagnosis not present

## 2015-09-05 DIAGNOSIS — I129 Hypertensive chronic kidney disease with stage 1 through stage 4 chronic kidney disease, or unspecified chronic kidney disease: Secondary | ICD-10-CM | POA: Diagnosis not present

## 2015-09-05 DIAGNOSIS — N183 Chronic kidney disease, stage 3 (moderate): Secondary | ICD-10-CM | POA: Diagnosis not present

## 2015-09-07 DIAGNOSIS — I4891 Unspecified atrial fibrillation: Secondary | ICD-10-CM | POA: Diagnosis not present

## 2015-09-07 DIAGNOSIS — M109 Gout, unspecified: Secondary | ICD-10-CM | POA: Diagnosis not present

## 2015-09-14 DIAGNOSIS — I4891 Unspecified atrial fibrillation: Secondary | ICD-10-CM | POA: Diagnosis not present

## 2015-09-21 DIAGNOSIS — I4891 Unspecified atrial fibrillation: Secondary | ICD-10-CM | POA: Diagnosis not present

## 2015-09-28 DIAGNOSIS — I4891 Unspecified atrial fibrillation: Secondary | ICD-10-CM | POA: Diagnosis not present

## 2015-10-26 DIAGNOSIS — I4891 Unspecified atrial fibrillation: Secondary | ICD-10-CM | POA: Diagnosis not present

## 2015-11-22 DIAGNOSIS — I4891 Unspecified atrial fibrillation: Secondary | ICD-10-CM | POA: Diagnosis not present

## 2015-12-13 DIAGNOSIS — Z0000 Encounter for general adult medical examination without abnormal findings: Secondary | ICD-10-CM | POA: Diagnosis not present

## 2015-12-13 DIAGNOSIS — Z139 Encounter for screening, unspecified: Secondary | ICD-10-CM | POA: Diagnosis not present

## 2015-12-13 DIAGNOSIS — Z1389 Encounter for screening for other disorder: Secondary | ICD-10-CM | POA: Diagnosis not present

## 2015-12-21 DIAGNOSIS — M109 Gout, unspecified: Secondary | ICD-10-CM | POA: Diagnosis not present

## 2016-01-23 DIAGNOSIS — I4891 Unspecified atrial fibrillation: Secondary | ICD-10-CM | POA: Diagnosis not present

## 2016-02-22 DIAGNOSIS — I4891 Unspecified atrial fibrillation: Secondary | ICD-10-CM | POA: Diagnosis not present

## 2016-03-04 DIAGNOSIS — E1369 Other specified diabetes mellitus with other specified complication: Secondary | ICD-10-CM | POA: Diagnosis not present

## 2016-03-04 DIAGNOSIS — E1169 Type 2 diabetes mellitus with other specified complication: Secondary | ICD-10-CM | POA: Diagnosis not present

## 2016-03-04 DIAGNOSIS — E039 Hypothyroidism, unspecified: Secondary | ICD-10-CM | POA: Diagnosis not present

## 2016-03-04 DIAGNOSIS — I129 Hypertensive chronic kidney disease with stage 1 through stage 4 chronic kidney disease, or unspecified chronic kidney disease: Secondary | ICD-10-CM | POA: Diagnosis not present

## 2016-03-04 DIAGNOSIS — I4891 Unspecified atrial fibrillation: Secondary | ICD-10-CM | POA: Diagnosis not present

## 2016-03-04 DIAGNOSIS — M109 Gout, unspecified: Secondary | ICD-10-CM | POA: Diagnosis not present

## 2016-03-04 DIAGNOSIS — E785 Hyperlipidemia, unspecified: Secondary | ICD-10-CM | POA: Diagnosis not present

## 2016-03-11 DIAGNOSIS — E039 Hypothyroidism, unspecified: Secondary | ICD-10-CM | POA: Diagnosis not present

## 2016-03-11 DIAGNOSIS — E782 Mixed hyperlipidemia: Secondary | ICD-10-CM | POA: Diagnosis not present

## 2016-03-11 DIAGNOSIS — E1169 Type 2 diabetes mellitus with other specified complication: Secondary | ICD-10-CM | POA: Diagnosis not present

## 2016-03-11 DIAGNOSIS — I129 Hypertensive chronic kidney disease with stage 1 through stage 4 chronic kidney disease, or unspecified chronic kidney disease: Secondary | ICD-10-CM | POA: Diagnosis not present

## 2016-04-08 DIAGNOSIS — M109 Gout, unspecified: Secondary | ICD-10-CM | POA: Diagnosis not present

## 2016-04-08 DIAGNOSIS — I4891 Unspecified atrial fibrillation: Secondary | ICD-10-CM | POA: Diagnosis not present

## 2016-04-18 DIAGNOSIS — J9690 Respiratory failure, unspecified, unspecified whether with hypoxia or hypercapnia: Secondary | ICD-10-CM | POA: Diagnosis not present

## 2016-04-18 DIAGNOSIS — R110 Nausea: Secondary | ICD-10-CM | POA: Diagnosis not present

## 2016-04-18 DIAGNOSIS — R42 Dizziness and giddiness: Secondary | ICD-10-CM | POA: Diagnosis not present

## 2016-05-09 DIAGNOSIS — E1169 Type 2 diabetes mellitus with other specified complication: Secondary | ICD-10-CM | POA: Diagnosis not present

## 2016-05-09 DIAGNOSIS — R42 Dizziness and giddiness: Secondary | ICD-10-CM | POA: Diagnosis not present

## 2016-05-09 DIAGNOSIS — E782 Mixed hyperlipidemia: Secondary | ICD-10-CM | POA: Diagnosis not present

## 2016-05-09 DIAGNOSIS — I4891 Unspecified atrial fibrillation: Secondary | ICD-10-CM | POA: Diagnosis not present

## 2016-06-06 DIAGNOSIS — I4891 Unspecified atrial fibrillation: Secondary | ICD-10-CM | POA: Diagnosis not present

## 2016-06-13 DIAGNOSIS — I4891 Unspecified atrial fibrillation: Secondary | ICD-10-CM | POA: Diagnosis not present

## 2016-07-15 DIAGNOSIS — Z1231 Encounter for screening mammogram for malignant neoplasm of breast: Secondary | ICD-10-CM | POA: Diagnosis not present

## 2016-07-18 DIAGNOSIS — Z170 Estrogen receptor positive status [ER+]: Secondary | ICD-10-CM | POA: Diagnosis not present

## 2016-07-18 DIAGNOSIS — C50312 Malignant neoplasm of lower-inner quadrant of left female breast: Secondary | ICD-10-CM | POA: Diagnosis not present

## 2016-07-18 DIAGNOSIS — Z853 Personal history of malignant neoplasm of breast: Secondary | ICD-10-CM | POA: Diagnosis not present

## 2016-07-18 DIAGNOSIS — Z79811 Long term (current) use of aromatase inhibitors: Secondary | ICD-10-CM | POA: Diagnosis not present

## 2016-07-26 DIAGNOSIS — E1122 Type 2 diabetes mellitus with diabetic chronic kidney disease: Secondary | ICD-10-CM | POA: Diagnosis not present

## 2016-07-26 DIAGNOSIS — M109 Gout, unspecified: Secondary | ICD-10-CM | POA: Diagnosis not present

## 2016-07-26 DIAGNOSIS — E1169 Type 2 diabetes mellitus with other specified complication: Secondary | ICD-10-CM | POA: Diagnosis not present

## 2016-07-26 DIAGNOSIS — I129 Hypertensive chronic kidney disease with stage 1 through stage 4 chronic kidney disease, or unspecified chronic kidney disease: Secondary | ICD-10-CM | POA: Diagnosis not present

## 2016-07-26 DIAGNOSIS — Z23 Encounter for immunization: Secondary | ICD-10-CM | POA: Diagnosis not present

## 2016-07-26 DIAGNOSIS — E782 Mixed hyperlipidemia: Secondary | ICD-10-CM | POA: Diagnosis not present

## 2016-07-26 DIAGNOSIS — I4891 Unspecified atrial fibrillation: Secondary | ICD-10-CM | POA: Diagnosis not present

## 2016-09-02 DIAGNOSIS — I4891 Unspecified atrial fibrillation: Secondary | ICD-10-CM | POA: Diagnosis not present

## 2016-12-09 DIAGNOSIS — Z7901 Long term (current) use of anticoagulants: Secondary | ICD-10-CM | POA: Diagnosis not present

## 2016-12-09 DIAGNOSIS — Z5181 Encounter for therapeutic drug level monitoring: Secondary | ICD-10-CM | POA: Diagnosis not present

## 2016-12-09 DIAGNOSIS — I4891 Unspecified atrial fibrillation: Secondary | ICD-10-CM | POA: Diagnosis not present

## 2016-12-09 DIAGNOSIS — Z853 Personal history of malignant neoplasm of breast: Secondary | ICD-10-CM | POA: Diagnosis not present

## 2016-12-17 DIAGNOSIS — Z7189 Other specified counseling: Secondary | ICD-10-CM | POA: Diagnosis not present

## 2016-12-17 DIAGNOSIS — Z0000 Encounter for general adult medical examination without abnormal findings: Secondary | ICD-10-CM | POA: Diagnosis not present

## 2016-12-17 DIAGNOSIS — Z1389 Encounter for screening for other disorder: Secondary | ICD-10-CM | POA: Diagnosis not present

## 2016-12-17 DIAGNOSIS — Z139 Encounter for screening, unspecified: Secondary | ICD-10-CM | POA: Diagnosis not present

## 2017-01-08 DIAGNOSIS — Z7901 Long term (current) use of anticoagulants: Secondary | ICD-10-CM | POA: Diagnosis not present

## 2017-01-08 DIAGNOSIS — I4891 Unspecified atrial fibrillation: Secondary | ICD-10-CM | POA: Diagnosis not present

## 2017-01-08 DIAGNOSIS — Z5181 Encounter for therapeutic drug level monitoring: Secondary | ICD-10-CM | POA: Diagnosis not present

## 2017-01-15 DIAGNOSIS — Z5181 Encounter for therapeutic drug level monitoring: Secondary | ICD-10-CM | POA: Diagnosis not present

## 2017-01-15 DIAGNOSIS — I4891 Unspecified atrial fibrillation: Secondary | ICD-10-CM | POA: Diagnosis not present

## 2017-01-15 DIAGNOSIS — Z7901 Long term (current) use of anticoagulants: Secondary | ICD-10-CM | POA: Diagnosis not present

## 2017-02-19 DIAGNOSIS — Z1389 Encounter for screening for other disorder: Secondary | ICD-10-CM | POA: Diagnosis not present

## 2017-02-19 DIAGNOSIS — I4891 Unspecified atrial fibrillation: Secondary | ICD-10-CM | POA: Diagnosis not present

## 2017-02-19 DIAGNOSIS — Z7901 Long term (current) use of anticoagulants: Secondary | ICD-10-CM | POA: Diagnosis not present

## 2017-02-19 DIAGNOSIS — Z139 Encounter for screening, unspecified: Secondary | ICD-10-CM | POA: Diagnosis not present

## 2017-02-19 DIAGNOSIS — Z5181 Encounter for therapeutic drug level monitoring: Secondary | ICD-10-CM | POA: Diagnosis not present

## 2017-03-26 DIAGNOSIS — E039 Hypothyroidism, unspecified: Secondary | ICD-10-CM | POA: Diagnosis not present

## 2017-03-26 DIAGNOSIS — I129 Hypertensive chronic kidney disease with stage 1 through stage 4 chronic kidney disease, or unspecified chronic kidney disease: Secondary | ICD-10-CM | POA: Diagnosis not present

## 2017-03-26 DIAGNOSIS — E1169 Type 2 diabetes mellitus with other specified complication: Secondary | ICD-10-CM | POA: Diagnosis not present

## 2017-04-09 DIAGNOSIS — Z5181 Encounter for therapeutic drug level monitoring: Secondary | ICD-10-CM | POA: Diagnosis not present

## 2017-04-09 DIAGNOSIS — N183 Chronic kidney disease, stage 3 (moderate): Secondary | ICD-10-CM | POA: Diagnosis not present

## 2017-04-09 DIAGNOSIS — E1169 Type 2 diabetes mellitus with other specified complication: Secondary | ICD-10-CM | POA: Diagnosis not present

## 2017-04-09 DIAGNOSIS — E782 Mixed hyperlipidemia: Secondary | ICD-10-CM | POA: Diagnosis not present

## 2017-04-09 DIAGNOSIS — I129 Hypertensive chronic kidney disease with stage 1 through stage 4 chronic kidney disease, or unspecified chronic kidney disease: Secondary | ICD-10-CM | POA: Diagnosis not present

## 2017-05-12 DIAGNOSIS — Z7901 Long term (current) use of anticoagulants: Secondary | ICD-10-CM | POA: Diagnosis not present

## 2017-05-12 DIAGNOSIS — Z23 Encounter for immunization: Secondary | ICD-10-CM | POA: Diagnosis not present

## 2017-05-12 DIAGNOSIS — E1169 Type 2 diabetes mellitus with other specified complication: Secondary | ICD-10-CM | POA: Diagnosis not present

## 2017-05-12 DIAGNOSIS — Z5181 Encounter for therapeutic drug level monitoring: Secondary | ICD-10-CM | POA: Diagnosis not present

## 2017-05-12 DIAGNOSIS — I4891 Unspecified atrial fibrillation: Secondary | ICD-10-CM | POA: Diagnosis not present

## 2017-05-12 DIAGNOSIS — E782 Mixed hyperlipidemia: Secondary | ICD-10-CM | POA: Diagnosis not present

## 2017-06-25 DIAGNOSIS — E782 Mixed hyperlipidemia: Secondary | ICD-10-CM | POA: Diagnosis not present

## 2017-06-25 DIAGNOSIS — Z5181 Encounter for therapeutic drug level monitoring: Secondary | ICD-10-CM | POA: Diagnosis not present

## 2017-06-25 DIAGNOSIS — N183 Chronic kidney disease, stage 3 (moderate): Secondary | ICD-10-CM | POA: Diagnosis not present

## 2017-06-25 DIAGNOSIS — E1122 Type 2 diabetes mellitus with diabetic chronic kidney disease: Secondary | ICD-10-CM | POA: Diagnosis not present

## 2017-06-25 DIAGNOSIS — E1169 Type 2 diabetes mellitus with other specified complication: Secondary | ICD-10-CM | POA: Diagnosis not present

## 2017-06-25 DIAGNOSIS — I4891 Unspecified atrial fibrillation: Secondary | ICD-10-CM | POA: Diagnosis not present

## 2017-07-16 DIAGNOSIS — M8589 Other specified disorders of bone density and structure, multiple sites: Secondary | ICD-10-CM | POA: Diagnosis not present

## 2017-07-16 DIAGNOSIS — M810 Age-related osteoporosis without current pathological fracture: Secondary | ICD-10-CM | POA: Diagnosis not present

## 2017-07-16 DIAGNOSIS — Z1231 Encounter for screening mammogram for malignant neoplasm of breast: Secondary | ICD-10-CM | POA: Diagnosis not present

## 2017-07-18 DIAGNOSIS — Z79811 Long term (current) use of aromatase inhibitors: Secondary | ICD-10-CM | POA: Diagnosis not present

## 2017-07-18 DIAGNOSIS — M810 Age-related osteoporosis without current pathological fracture: Secondary | ICD-10-CM | POA: Diagnosis not present

## 2017-07-18 DIAGNOSIS — C50312 Malignant neoplasm of lower-inner quadrant of left female breast: Secondary | ICD-10-CM | POA: Diagnosis not present

## 2017-07-18 DIAGNOSIS — Z170 Estrogen receptor positive status [ER+]: Secondary | ICD-10-CM | POA: Diagnosis not present

## 2017-07-18 DIAGNOSIS — Z853 Personal history of malignant neoplasm of breast: Secondary | ICD-10-CM | POA: Diagnosis not present

## 2017-07-30 DIAGNOSIS — Z5181 Encounter for therapeutic drug level monitoring: Secondary | ICD-10-CM | POA: Diagnosis not present

## 2017-07-30 DIAGNOSIS — M810 Age-related osteoporosis without current pathological fracture: Secondary | ICD-10-CM | POA: Diagnosis not present

## 2017-07-30 DIAGNOSIS — Z7901 Long term (current) use of anticoagulants: Secondary | ICD-10-CM | POA: Diagnosis not present

## 2017-07-30 DIAGNOSIS — I4891 Unspecified atrial fibrillation: Secondary | ICD-10-CM | POA: Diagnosis not present

## 2017-08-27 DIAGNOSIS — Z5181 Encounter for therapeutic drug level monitoring: Secondary | ICD-10-CM | POA: Diagnosis not present

## 2017-08-27 DIAGNOSIS — Z7901 Long term (current) use of anticoagulants: Secondary | ICD-10-CM | POA: Diagnosis not present

## 2017-08-27 DIAGNOSIS — I4891 Unspecified atrial fibrillation: Secondary | ICD-10-CM | POA: Diagnosis not present

## 2017-10-08 DIAGNOSIS — E039 Hypothyroidism, unspecified: Secondary | ICD-10-CM | POA: Diagnosis not present

## 2017-10-08 DIAGNOSIS — E1169 Type 2 diabetes mellitus with other specified complication: Secondary | ICD-10-CM | POA: Diagnosis not present

## 2017-10-08 DIAGNOSIS — I129 Hypertensive chronic kidney disease with stage 1 through stage 4 chronic kidney disease, or unspecified chronic kidney disease: Secondary | ICD-10-CM | POA: Diagnosis not present

## 2017-10-15 DIAGNOSIS — I129 Hypertensive chronic kidney disease with stage 1 through stage 4 chronic kidney disease, or unspecified chronic kidney disease: Secondary | ICD-10-CM | POA: Diagnosis not present

## 2017-10-15 DIAGNOSIS — E039 Hypothyroidism, unspecified: Secondary | ICD-10-CM | POA: Diagnosis not present

## 2017-10-15 DIAGNOSIS — Z789 Other specified health status: Secondary | ICD-10-CM | POA: Diagnosis not present

## 2017-10-15 DIAGNOSIS — Z1331 Encounter for screening for depression: Secondary | ICD-10-CM | POA: Diagnosis not present

## 2017-10-15 DIAGNOSIS — Z0000 Encounter for general adult medical examination without abnormal findings: Secondary | ICD-10-CM | POA: Diagnosis not present

## 2017-10-15 DIAGNOSIS — N183 Chronic kidney disease, stage 3 (moderate): Secondary | ICD-10-CM | POA: Diagnosis not present

## 2017-10-15 DIAGNOSIS — E1169 Type 2 diabetes mellitus with other specified complication: Secondary | ICD-10-CM | POA: Diagnosis not present

## 2017-10-15 DIAGNOSIS — Z139 Encounter for screening, unspecified: Secondary | ICD-10-CM | POA: Diagnosis not present

## 2017-10-15 DIAGNOSIS — Z5181 Encounter for therapeutic drug level monitoring: Secondary | ICD-10-CM | POA: Diagnosis not present

## 2017-11-14 DIAGNOSIS — I4891 Unspecified atrial fibrillation: Secondary | ICD-10-CM | POA: Diagnosis not present

## 2017-11-14 DIAGNOSIS — Z7901 Long term (current) use of anticoagulants: Secondary | ICD-10-CM | POA: Diagnosis not present

## 2017-11-14 DIAGNOSIS — Z5181 Encounter for therapeutic drug level monitoring: Secondary | ICD-10-CM | POA: Diagnosis not present

## 2017-11-14 DIAGNOSIS — E039 Hypothyroidism, unspecified: Secondary | ICD-10-CM | POA: Diagnosis not present

## 2017-11-28 DIAGNOSIS — I4891 Unspecified atrial fibrillation: Secondary | ICD-10-CM | POA: Diagnosis not present

## 2017-11-28 DIAGNOSIS — Z7901 Long term (current) use of anticoagulants: Secondary | ICD-10-CM | POA: Diagnosis not present

## 2017-11-28 DIAGNOSIS — Z5181 Encounter for therapeutic drug level monitoring: Secondary | ICD-10-CM | POA: Diagnosis not present

## 2017-12-12 DIAGNOSIS — I4891 Unspecified atrial fibrillation: Secondary | ICD-10-CM | POA: Diagnosis not present

## 2017-12-12 DIAGNOSIS — Z5181 Encounter for therapeutic drug level monitoring: Secondary | ICD-10-CM | POA: Diagnosis not present

## 2017-12-12 DIAGNOSIS — Z7901 Long term (current) use of anticoagulants: Secondary | ICD-10-CM | POA: Diagnosis not present

## 2018-01-09 DIAGNOSIS — I129 Hypertensive chronic kidney disease with stage 1 through stage 4 chronic kidney disease, or unspecified chronic kidney disease: Secondary | ICD-10-CM | POA: Diagnosis not present

## 2018-01-09 DIAGNOSIS — N183 Chronic kidney disease, stage 3 (moderate): Secondary | ICD-10-CM | POA: Diagnosis not present

## 2018-01-09 DIAGNOSIS — E1169 Type 2 diabetes mellitus with other specified complication: Secondary | ICD-10-CM | POA: Diagnosis not present

## 2018-01-09 DIAGNOSIS — Z7901 Long term (current) use of anticoagulants: Secondary | ICD-10-CM | POA: Diagnosis not present

## 2018-01-16 DIAGNOSIS — Z7901 Long term (current) use of anticoagulants: Secondary | ICD-10-CM | POA: Diagnosis not present

## 2018-01-16 DIAGNOSIS — I4891 Unspecified atrial fibrillation: Secondary | ICD-10-CM | POA: Diagnosis not present

## 2018-01-16 DIAGNOSIS — Z5181 Encounter for therapeutic drug level monitoring: Secondary | ICD-10-CM | POA: Diagnosis not present

## 2018-01-16 DIAGNOSIS — Z139 Encounter for screening, unspecified: Secondary | ICD-10-CM | POA: Diagnosis not present

## 2018-02-08 DIAGNOSIS — N183 Chronic kidney disease, stage 3 (moderate): Secondary | ICD-10-CM | POA: Diagnosis not present

## 2018-02-08 DIAGNOSIS — I129 Hypertensive chronic kidney disease with stage 1 through stage 4 chronic kidney disease, or unspecified chronic kidney disease: Secondary | ICD-10-CM | POA: Diagnosis not present

## 2018-02-20 DIAGNOSIS — Z5181 Encounter for therapeutic drug level monitoring: Secondary | ICD-10-CM | POA: Diagnosis not present

## 2018-02-20 DIAGNOSIS — Z139 Encounter for screening, unspecified: Secondary | ICD-10-CM | POA: Diagnosis not present

## 2018-02-20 DIAGNOSIS — I4891 Unspecified atrial fibrillation: Secondary | ICD-10-CM | POA: Diagnosis not present

## 2018-02-20 DIAGNOSIS — Z7901 Long term (current) use of anticoagulants: Secondary | ICD-10-CM | POA: Diagnosis not present

## 2018-02-23 DIAGNOSIS — Z5181 Encounter for therapeutic drug level monitoring: Secondary | ICD-10-CM | POA: Diagnosis not present

## 2018-02-23 DIAGNOSIS — Z7901 Long term (current) use of anticoagulants: Secondary | ICD-10-CM | POA: Diagnosis not present

## 2018-02-23 DIAGNOSIS — I4891 Unspecified atrial fibrillation: Secondary | ICD-10-CM | POA: Diagnosis not present

## 2018-03-09 DIAGNOSIS — Z7901 Long term (current) use of anticoagulants: Secondary | ICD-10-CM | POA: Diagnosis not present

## 2018-03-09 DIAGNOSIS — I4891 Unspecified atrial fibrillation: Secondary | ICD-10-CM | POA: Diagnosis not present

## 2018-03-09 DIAGNOSIS — Z5181 Encounter for therapeutic drug level monitoring: Secondary | ICD-10-CM | POA: Diagnosis not present

## 2018-03-11 DIAGNOSIS — Z7901 Long term (current) use of anticoagulants: Secondary | ICD-10-CM | POA: Diagnosis not present

## 2018-03-11 DIAGNOSIS — I4891 Unspecified atrial fibrillation: Secondary | ICD-10-CM | POA: Diagnosis not present

## 2018-04-09 DIAGNOSIS — Z7901 Long term (current) use of anticoagulants: Secondary | ICD-10-CM | POA: Diagnosis not present

## 2018-04-09 DIAGNOSIS — Z23 Encounter for immunization: Secondary | ICD-10-CM | POA: Diagnosis not present

## 2018-04-09 DIAGNOSIS — Z5181 Encounter for therapeutic drug level monitoring: Secondary | ICD-10-CM | POA: Diagnosis not present

## 2018-04-09 DIAGNOSIS — I4891 Unspecified atrial fibrillation: Secondary | ICD-10-CM | POA: Diagnosis not present

## 2018-04-10 DIAGNOSIS — I4891 Unspecified atrial fibrillation: Secondary | ICD-10-CM | POA: Diagnosis not present

## 2018-04-10 DIAGNOSIS — Z7901 Long term (current) use of anticoagulants: Secondary | ICD-10-CM | POA: Diagnosis not present

## 2018-04-23 DIAGNOSIS — I129 Hypertensive chronic kidney disease with stage 1 through stage 4 chronic kidney disease, or unspecified chronic kidney disease: Secondary | ICD-10-CM | POA: Diagnosis not present

## 2018-04-23 DIAGNOSIS — E1169 Type 2 diabetes mellitus with other specified complication: Secondary | ICD-10-CM | POA: Diagnosis not present

## 2018-05-11 DIAGNOSIS — E1169 Type 2 diabetes mellitus with other specified complication: Secondary | ICD-10-CM | POA: Diagnosis not present

## 2018-05-11 DIAGNOSIS — Z139 Encounter for screening, unspecified: Secondary | ICD-10-CM | POA: Diagnosis not present

## 2018-05-11 DIAGNOSIS — Z7901 Long term (current) use of anticoagulants: Secondary | ICD-10-CM | POA: Diagnosis not present

## 2018-05-11 DIAGNOSIS — E782 Mixed hyperlipidemia: Secondary | ICD-10-CM | POA: Diagnosis not present

## 2018-05-11 DIAGNOSIS — E039 Hypothyroidism, unspecified: Secondary | ICD-10-CM | POA: Diagnosis not present

## 2018-05-11 DIAGNOSIS — I4891 Unspecified atrial fibrillation: Secondary | ICD-10-CM | POA: Diagnosis not present

## 2018-05-11 DIAGNOSIS — Z5181 Encounter for therapeutic drug level monitoring: Secondary | ICD-10-CM | POA: Diagnosis not present

## 2018-05-20 DIAGNOSIS — H2703 Aphakia, bilateral: Secondary | ICD-10-CM | POA: Diagnosis not present

## 2018-06-11 DIAGNOSIS — E782 Mixed hyperlipidemia: Secondary | ICD-10-CM | POA: Diagnosis not present

## 2018-06-11 DIAGNOSIS — Z7901 Long term (current) use of anticoagulants: Secondary | ICD-10-CM | POA: Diagnosis not present

## 2018-06-11 DIAGNOSIS — Z5181 Encounter for therapeutic drug level monitoring: Secondary | ICD-10-CM | POA: Diagnosis not present

## 2018-06-11 DIAGNOSIS — E1169 Type 2 diabetes mellitus with other specified complication: Secondary | ICD-10-CM | POA: Diagnosis not present

## 2018-06-11 DIAGNOSIS — I4891 Unspecified atrial fibrillation: Secondary | ICD-10-CM | POA: Diagnosis not present

## 2018-06-25 DIAGNOSIS — I4891 Unspecified atrial fibrillation: Secondary | ICD-10-CM | POA: Diagnosis not present

## 2018-06-25 DIAGNOSIS — Z139 Encounter for screening, unspecified: Secondary | ICD-10-CM | POA: Diagnosis not present

## 2018-06-25 DIAGNOSIS — Z5181 Encounter for therapeutic drug level monitoring: Secondary | ICD-10-CM | POA: Diagnosis not present

## 2018-06-25 DIAGNOSIS — Z7901 Long term (current) use of anticoagulants: Secondary | ICD-10-CM | POA: Diagnosis not present

## 2018-07-27 DIAGNOSIS — Z1231 Encounter for screening mammogram for malignant neoplasm of breast: Secondary | ICD-10-CM | POA: Diagnosis not present

## 2018-07-27 DIAGNOSIS — I4891 Unspecified atrial fibrillation: Secondary | ICD-10-CM | POA: Diagnosis not present

## 2018-07-27 DIAGNOSIS — Z139 Encounter for screening, unspecified: Secondary | ICD-10-CM | POA: Diagnosis not present

## 2018-07-27 DIAGNOSIS — T6591XA Toxic effect of unspecified substance, accidental (unintentional), initial encounter: Secondary | ICD-10-CM | POA: Diagnosis not present

## 2018-07-27 DIAGNOSIS — Z5181 Encounter for therapeutic drug level monitoring: Secondary | ICD-10-CM | POA: Diagnosis not present

## 2018-07-27 DIAGNOSIS — Z7901 Long term (current) use of anticoagulants: Secondary | ICD-10-CM | POA: Diagnosis not present

## 2018-07-28 DIAGNOSIS — Z853 Personal history of malignant neoplasm of breast: Secondary | ICD-10-CM | POA: Diagnosis not present

## 2018-08-11 DIAGNOSIS — Z7901 Long term (current) use of anticoagulants: Secondary | ICD-10-CM | POA: Diagnosis not present

## 2018-08-11 DIAGNOSIS — I4891 Unspecified atrial fibrillation: Secondary | ICD-10-CM | POA: Diagnosis not present

## 2018-08-28 DIAGNOSIS — Z139 Encounter for screening, unspecified: Secondary | ICD-10-CM | POA: Diagnosis not present

## 2018-08-28 DIAGNOSIS — L630 Alopecia (capitis) totalis: Secondary | ICD-10-CM | POA: Diagnosis not present

## 2018-08-28 DIAGNOSIS — Z7901 Long term (current) use of anticoagulants: Secondary | ICD-10-CM | POA: Diagnosis not present

## 2018-08-28 DIAGNOSIS — I4891 Unspecified atrial fibrillation: Secondary | ICD-10-CM | POA: Diagnosis not present

## 2018-08-28 DIAGNOSIS — Z5181 Encounter for therapeutic drug level monitoring: Secondary | ICD-10-CM | POA: Diagnosis not present

## 2018-08-28 DIAGNOSIS — Z0000 Encounter for general adult medical examination without abnormal findings: Secondary | ICD-10-CM | POA: Diagnosis not present

## 2018-09-02 DIAGNOSIS — R2689 Other abnormalities of gait and mobility: Secondary | ICD-10-CM | POA: Diagnosis not present

## 2018-09-02 DIAGNOSIS — M6281 Muscle weakness (generalized): Secondary | ICD-10-CM | POA: Diagnosis not present

## 2018-09-02 DIAGNOSIS — R2681 Unsteadiness on feet: Secondary | ICD-10-CM | POA: Diagnosis not present

## 2018-09-02 DIAGNOSIS — R269 Unspecified abnormalities of gait and mobility: Secondary | ICD-10-CM | POA: Diagnosis not present

## 2018-09-09 DIAGNOSIS — R269 Unspecified abnormalities of gait and mobility: Secondary | ICD-10-CM | POA: Diagnosis not present

## 2018-09-09 DIAGNOSIS — M6281 Muscle weakness (generalized): Secondary | ICD-10-CM | POA: Diagnosis not present

## 2018-09-09 DIAGNOSIS — R2681 Unsteadiness on feet: Secondary | ICD-10-CM | POA: Diagnosis not present

## 2018-09-09 DIAGNOSIS — R2689 Other abnormalities of gait and mobility: Secondary | ICD-10-CM | POA: Diagnosis not present

## 2018-09-10 DIAGNOSIS — L650 Telogen effluvium: Secondary | ICD-10-CM | POA: Diagnosis not present

## 2018-09-11 DIAGNOSIS — Z7901 Long term (current) use of anticoagulants: Secondary | ICD-10-CM | POA: Diagnosis not present

## 2018-09-11 DIAGNOSIS — I4891 Unspecified atrial fibrillation: Secondary | ICD-10-CM | POA: Diagnosis not present

## 2018-09-16 DIAGNOSIS — R2689 Other abnormalities of gait and mobility: Secondary | ICD-10-CM | POA: Diagnosis not present

## 2018-09-16 DIAGNOSIS — R269 Unspecified abnormalities of gait and mobility: Secondary | ICD-10-CM | POA: Diagnosis not present

## 2018-09-16 DIAGNOSIS — R2681 Unsteadiness on feet: Secondary | ICD-10-CM | POA: Diagnosis not present

## 2018-09-16 DIAGNOSIS — M6281 Muscle weakness (generalized): Secondary | ICD-10-CM | POA: Diagnosis not present

## 2018-09-22 DIAGNOSIS — M6281 Muscle weakness (generalized): Secondary | ICD-10-CM | POA: Diagnosis not present

## 2018-09-22 DIAGNOSIS — R2681 Unsteadiness on feet: Secondary | ICD-10-CM | POA: Diagnosis not present

## 2018-09-22 DIAGNOSIS — R269 Unspecified abnormalities of gait and mobility: Secondary | ICD-10-CM | POA: Diagnosis not present

## 2018-09-22 DIAGNOSIS — R2689 Other abnormalities of gait and mobility: Secondary | ICD-10-CM | POA: Diagnosis not present

## 2018-09-28 DIAGNOSIS — I4891 Unspecified atrial fibrillation: Secondary | ICD-10-CM | POA: Diagnosis not present

## 2018-09-28 DIAGNOSIS — L650 Telogen effluvium: Secondary | ICD-10-CM | POA: Diagnosis not present

## 2018-09-28 DIAGNOSIS — Z5181 Encounter for therapeutic drug level monitoring: Secondary | ICD-10-CM | POA: Diagnosis not present

## 2018-09-28 DIAGNOSIS — Z7901 Long term (current) use of anticoagulants: Secondary | ICD-10-CM | POA: Diagnosis not present

## 2018-09-28 DIAGNOSIS — Z139 Encounter for screening, unspecified: Secondary | ICD-10-CM | POA: Diagnosis not present

## 2018-09-29 DIAGNOSIS — M6281 Muscle weakness (generalized): Secondary | ICD-10-CM | POA: Diagnosis not present

## 2018-09-29 DIAGNOSIS — R2681 Unsteadiness on feet: Secondary | ICD-10-CM | POA: Diagnosis not present

## 2018-09-29 DIAGNOSIS — R2689 Other abnormalities of gait and mobility: Secondary | ICD-10-CM | POA: Diagnosis not present

## 2018-09-29 DIAGNOSIS — R269 Unspecified abnormalities of gait and mobility: Secondary | ICD-10-CM | POA: Diagnosis not present

## 2018-10-06 DIAGNOSIS — R2689 Other abnormalities of gait and mobility: Secondary | ICD-10-CM | POA: Diagnosis not present

## 2018-10-06 DIAGNOSIS — R2681 Unsteadiness on feet: Secondary | ICD-10-CM | POA: Diagnosis not present

## 2018-10-06 DIAGNOSIS — M6281 Muscle weakness (generalized): Secondary | ICD-10-CM | POA: Diagnosis not present

## 2018-10-06 DIAGNOSIS — R269 Unspecified abnormalities of gait and mobility: Secondary | ICD-10-CM | POA: Diagnosis not present

## 2018-10-09 DIAGNOSIS — E782 Mixed hyperlipidemia: Secondary | ICD-10-CM | POA: Diagnosis not present

## 2018-10-09 DIAGNOSIS — I4891 Unspecified atrial fibrillation: Secondary | ICD-10-CM | POA: Diagnosis not present

## 2018-10-09 DIAGNOSIS — E1169 Type 2 diabetes mellitus with other specified complication: Secondary | ICD-10-CM | POA: Diagnosis not present

## 2018-10-22 DIAGNOSIS — R269 Unspecified abnormalities of gait and mobility: Secondary | ICD-10-CM | POA: Diagnosis not present

## 2018-10-22 DIAGNOSIS — M6281 Muscle weakness (generalized): Secondary | ICD-10-CM | POA: Diagnosis not present

## 2018-10-22 DIAGNOSIS — R2689 Other abnormalities of gait and mobility: Secondary | ICD-10-CM | POA: Diagnosis not present

## 2018-10-22 DIAGNOSIS — R2681 Unsteadiness on feet: Secondary | ICD-10-CM | POA: Diagnosis not present

## 2018-11-10 DIAGNOSIS — E1169 Type 2 diabetes mellitus with other specified complication: Secondary | ICD-10-CM | POA: Diagnosis not present

## 2018-11-10 DIAGNOSIS — E782 Mixed hyperlipidemia: Secondary | ICD-10-CM | POA: Diagnosis not present

## 2018-12-10 DIAGNOSIS — E1169 Type 2 diabetes mellitus with other specified complication: Secondary | ICD-10-CM | POA: Diagnosis not present

## 2018-12-10 DIAGNOSIS — E782 Mixed hyperlipidemia: Secondary | ICD-10-CM | POA: Diagnosis not present

## 2018-12-18 DIAGNOSIS — Z5181 Encounter for therapeutic drug level monitoring: Secondary | ICD-10-CM | POA: Diagnosis not present

## 2018-12-18 DIAGNOSIS — Z7901 Long term (current) use of anticoagulants: Secondary | ICD-10-CM | POA: Diagnosis not present

## 2018-12-18 DIAGNOSIS — L659 Nonscarring hair loss, unspecified: Secondary | ICD-10-CM | POA: Diagnosis not present

## 2018-12-18 DIAGNOSIS — I4891 Unspecified atrial fibrillation: Secondary | ICD-10-CM | POA: Diagnosis not present

## 2019-01-08 DIAGNOSIS — E1169 Type 2 diabetes mellitus with other specified complication: Secondary | ICD-10-CM | POA: Diagnosis not present

## 2019-01-08 DIAGNOSIS — E782 Mixed hyperlipidemia: Secondary | ICD-10-CM | POA: Diagnosis not present

## 2019-01-18 DIAGNOSIS — Z7901 Long term (current) use of anticoagulants: Secondary | ICD-10-CM | POA: Diagnosis not present

## 2019-01-18 DIAGNOSIS — Z5181 Encounter for therapeutic drug level monitoring: Secondary | ICD-10-CM | POA: Diagnosis not present

## 2019-01-18 DIAGNOSIS — I4891 Unspecified atrial fibrillation: Secondary | ICD-10-CM | POA: Diagnosis not present

## 2019-02-09 DIAGNOSIS — E1169 Type 2 diabetes mellitus with other specified complication: Secondary | ICD-10-CM | POA: Diagnosis not present

## 2019-02-09 DIAGNOSIS — I4891 Unspecified atrial fibrillation: Secondary | ICD-10-CM | POA: Diagnosis not present

## 2019-02-09 DIAGNOSIS — E782 Mixed hyperlipidemia: Secondary | ICD-10-CM | POA: Diagnosis not present

## 2019-02-26 DIAGNOSIS — D6869 Other thrombophilia: Secondary | ICD-10-CM | POA: Diagnosis not present

## 2019-02-26 DIAGNOSIS — I4891 Unspecified atrial fibrillation: Secondary | ICD-10-CM | POA: Diagnosis not present

## 2019-02-26 DIAGNOSIS — Z5181 Encounter for therapeutic drug level monitoring: Secondary | ICD-10-CM | POA: Diagnosis not present

## 2019-02-26 DIAGNOSIS — Z7901 Long term (current) use of anticoagulants: Secondary | ICD-10-CM | POA: Diagnosis not present

## 2019-03-12 DIAGNOSIS — I4891 Unspecified atrial fibrillation: Secondary | ICD-10-CM | POA: Diagnosis not present

## 2019-03-12 DIAGNOSIS — E1169 Type 2 diabetes mellitus with other specified complication: Secondary | ICD-10-CM | POA: Diagnosis not present

## 2019-03-12 DIAGNOSIS — E782 Mixed hyperlipidemia: Secondary | ICD-10-CM | POA: Diagnosis not present

## 2019-04-01 DIAGNOSIS — E1169 Type 2 diabetes mellitus with other specified complication: Secondary | ICD-10-CM | POA: Diagnosis not present

## 2019-04-01 DIAGNOSIS — Z5181 Encounter for therapeutic drug level monitoring: Secondary | ICD-10-CM | POA: Diagnosis not present

## 2019-04-01 DIAGNOSIS — I4891 Unspecified atrial fibrillation: Secondary | ICD-10-CM | POA: Diagnosis not present

## 2019-04-01 DIAGNOSIS — E782 Mixed hyperlipidemia: Secondary | ICD-10-CM | POA: Diagnosis not present

## 2019-04-01 DIAGNOSIS — Z7901 Long term (current) use of anticoagulants: Secondary | ICD-10-CM | POA: Diagnosis not present

## 2019-04-08 DIAGNOSIS — I129 Hypertensive chronic kidney disease with stage 1 through stage 4 chronic kidney disease, or unspecified chronic kidney disease: Secondary | ICD-10-CM | POA: Diagnosis not present

## 2019-04-08 DIAGNOSIS — E039 Hypothyroidism, unspecified: Secondary | ICD-10-CM | POA: Diagnosis not present

## 2019-04-08 DIAGNOSIS — E1169 Type 2 diabetes mellitus with other specified complication: Secondary | ICD-10-CM | POA: Diagnosis not present

## 2019-04-12 DIAGNOSIS — E782 Mixed hyperlipidemia: Secondary | ICD-10-CM | POA: Diagnosis not present

## 2019-04-12 DIAGNOSIS — E1169 Type 2 diabetes mellitus with other specified complication: Secondary | ICD-10-CM | POA: Diagnosis not present

## 2019-04-12 DIAGNOSIS — I4891 Unspecified atrial fibrillation: Secondary | ICD-10-CM | POA: Diagnosis not present

## 2019-04-15 DIAGNOSIS — E039 Hypothyroidism, unspecified: Secondary | ICD-10-CM | POA: Diagnosis not present

## 2019-04-15 DIAGNOSIS — E782 Mixed hyperlipidemia: Secondary | ICD-10-CM | POA: Diagnosis not present

## 2019-04-15 DIAGNOSIS — E1169 Type 2 diabetes mellitus with other specified complication: Secondary | ICD-10-CM | POA: Diagnosis not present

## 2019-04-29 DIAGNOSIS — I4891 Unspecified atrial fibrillation: Secondary | ICD-10-CM | POA: Diagnosis not present

## 2019-04-29 DIAGNOSIS — Z7901 Long term (current) use of anticoagulants: Secondary | ICD-10-CM | POA: Diagnosis not present

## 2019-04-29 DIAGNOSIS — Z5181 Encounter for therapeutic drug level monitoring: Secondary | ICD-10-CM | POA: Diagnosis not present

## 2019-04-29 DIAGNOSIS — D6869 Other thrombophilia: Secondary | ICD-10-CM | POA: Diagnosis not present

## 2019-05-12 DIAGNOSIS — E1169 Type 2 diabetes mellitus with other specified complication: Secondary | ICD-10-CM | POA: Diagnosis not present

## 2019-05-12 DIAGNOSIS — E039 Hypothyroidism, unspecified: Secondary | ICD-10-CM | POA: Diagnosis not present

## 2019-05-12 DIAGNOSIS — E782 Mixed hyperlipidemia: Secondary | ICD-10-CM | POA: Diagnosis not present

## 2019-06-03 DIAGNOSIS — Z5181 Encounter for therapeutic drug level monitoring: Secondary | ICD-10-CM | POA: Diagnosis not present

## 2019-06-03 DIAGNOSIS — I4891 Unspecified atrial fibrillation: Secondary | ICD-10-CM | POA: Diagnosis not present

## 2019-06-03 DIAGNOSIS — Z7901 Long term (current) use of anticoagulants: Secondary | ICD-10-CM | POA: Diagnosis not present

## 2019-06-11 DIAGNOSIS — E782 Mixed hyperlipidemia: Secondary | ICD-10-CM | POA: Diagnosis not present

## 2019-06-11 DIAGNOSIS — E1169 Type 2 diabetes mellitus with other specified complication: Secondary | ICD-10-CM | POA: Diagnosis not present

## 2019-06-11 DIAGNOSIS — E039 Hypothyroidism, unspecified: Secondary | ICD-10-CM | POA: Diagnosis not present

## 2019-07-12 DIAGNOSIS — E1169 Type 2 diabetes mellitus with other specified complication: Secondary | ICD-10-CM | POA: Diagnosis not present

## 2019-07-12 DIAGNOSIS — E039 Hypothyroidism, unspecified: Secondary | ICD-10-CM | POA: Diagnosis not present

## 2019-07-12 DIAGNOSIS — E782 Mixed hyperlipidemia: Secondary | ICD-10-CM | POA: Diagnosis not present

## 2019-07-23 DIAGNOSIS — R296 Repeated falls: Secondary | ICD-10-CM | POA: Diagnosis not present

## 2019-07-23 DIAGNOSIS — I4891 Unspecified atrial fibrillation: Secondary | ICD-10-CM | POA: Diagnosis not present

## 2019-07-23 DIAGNOSIS — Z5181 Encounter for therapeutic drug level monitoring: Secondary | ICD-10-CM | POA: Diagnosis not present

## 2019-07-23 DIAGNOSIS — Z7901 Long term (current) use of anticoagulants: Secondary | ICD-10-CM | POA: Diagnosis not present

## 2019-08-11 DIAGNOSIS — N183 Chronic kidney disease, stage 3 unspecified: Secondary | ICD-10-CM | POA: Diagnosis not present

## 2019-08-11 DIAGNOSIS — I4891 Unspecified atrial fibrillation: Secondary | ICD-10-CM | POA: Diagnosis not present

## 2019-08-11 DIAGNOSIS — E782 Mixed hyperlipidemia: Secondary | ICD-10-CM | POA: Diagnosis not present

## 2019-08-11 DIAGNOSIS — E1122 Type 2 diabetes mellitus with diabetic chronic kidney disease: Secondary | ICD-10-CM | POA: Diagnosis not present

## 2019-08-18 DIAGNOSIS — I129 Hypertensive chronic kidney disease with stage 1 through stage 4 chronic kidney disease, or unspecified chronic kidney disease: Secondary | ICD-10-CM | POA: Diagnosis not present

## 2019-08-18 DIAGNOSIS — E039 Hypothyroidism, unspecified: Secondary | ICD-10-CM | POA: Diagnosis not present

## 2019-08-18 DIAGNOSIS — E1169 Type 2 diabetes mellitus with other specified complication: Secondary | ICD-10-CM | POA: Diagnosis not present

## 2019-08-27 DIAGNOSIS — Z139 Encounter for screening, unspecified: Secondary | ICD-10-CM | POA: Diagnosis not present

## 2019-08-27 DIAGNOSIS — Z136 Encounter for screening for cardiovascular disorders: Secondary | ICD-10-CM | POA: Diagnosis not present

## 2019-08-27 DIAGNOSIS — I4891 Unspecified atrial fibrillation: Secondary | ICD-10-CM | POA: Diagnosis not present

## 2019-08-27 DIAGNOSIS — Z0000 Encounter for general adult medical examination without abnormal findings: Secondary | ICD-10-CM | POA: Diagnosis not present

## 2019-08-27 DIAGNOSIS — Z7189 Other specified counseling: Secondary | ICD-10-CM | POA: Diagnosis not present

## 2019-08-30 DIAGNOSIS — D6869 Other thrombophilia: Secondary | ICD-10-CM | POA: Diagnosis not present

## 2019-08-30 DIAGNOSIS — N390 Urinary tract infection, site not specified: Secondary | ICD-10-CM | POA: Diagnosis not present

## 2019-09-07 DIAGNOSIS — Z1231 Encounter for screening mammogram for malignant neoplasm of breast: Secondary | ICD-10-CM | POA: Diagnosis not present

## 2019-09-10 DIAGNOSIS — E1122 Type 2 diabetes mellitus with diabetic chronic kidney disease: Secondary | ICD-10-CM | POA: Diagnosis not present

## 2019-09-10 DIAGNOSIS — Z7901 Long term (current) use of anticoagulants: Secondary | ICD-10-CM | POA: Diagnosis not present

## 2019-09-10 DIAGNOSIS — E782 Mixed hyperlipidemia: Secondary | ICD-10-CM | POA: Diagnosis not present

## 2019-09-10 DIAGNOSIS — D6869 Other thrombophilia: Secondary | ICD-10-CM | POA: Diagnosis not present

## 2019-09-10 DIAGNOSIS — E039 Hypothyroidism, unspecified: Secondary | ICD-10-CM | POA: Diagnosis not present

## 2019-09-10 DIAGNOSIS — Z5181 Encounter for therapeutic drug level monitoring: Secondary | ICD-10-CM | POA: Diagnosis not present

## 2019-09-12 ENCOUNTER — Ambulatory Visit: Payer: Self-pay

## 2019-09-18 ENCOUNTER — Ambulatory Visit: Payer: Medicare Other

## 2019-09-23 ENCOUNTER — Ambulatory Visit: Payer: Self-pay

## 2019-10-07 DIAGNOSIS — R921 Mammographic calcification found on diagnostic imaging of breast: Secondary | ICD-10-CM | POA: Diagnosis not present

## 2019-10-07 DIAGNOSIS — R928 Other abnormal and inconclusive findings on diagnostic imaging of breast: Secondary | ICD-10-CM | POA: Diagnosis not present

## 2019-10-08 DIAGNOSIS — D6869 Other thrombophilia: Secondary | ICD-10-CM | POA: Diagnosis not present

## 2019-10-08 DIAGNOSIS — Z7901 Long term (current) use of anticoagulants: Secondary | ICD-10-CM | POA: Diagnosis not present

## 2019-10-08 DIAGNOSIS — E1122 Type 2 diabetes mellitus with diabetic chronic kidney disease: Secondary | ICD-10-CM | POA: Diagnosis not present

## 2019-10-08 DIAGNOSIS — Z5181 Encounter for therapeutic drug level monitoring: Secondary | ICD-10-CM | POA: Diagnosis not present

## 2019-10-08 DIAGNOSIS — Z139 Encounter for screening, unspecified: Secondary | ICD-10-CM | POA: Diagnosis not present

## 2019-10-10 DIAGNOSIS — E1122 Type 2 diabetes mellitus with diabetic chronic kidney disease: Secondary | ICD-10-CM | POA: Diagnosis not present

## 2019-10-10 DIAGNOSIS — N183 Chronic kidney disease, stage 3 unspecified: Secondary | ICD-10-CM | POA: Diagnosis not present

## 2019-10-10 DIAGNOSIS — E782 Mixed hyperlipidemia: Secondary | ICD-10-CM | POA: Diagnosis not present

## 2019-10-10 DIAGNOSIS — E039 Hypothyroidism, unspecified: Secondary | ICD-10-CM | POA: Diagnosis not present

## 2019-11-05 DIAGNOSIS — Z5181 Encounter for therapeutic drug level monitoring: Secondary | ICD-10-CM | POA: Diagnosis not present

## 2019-11-05 DIAGNOSIS — Z7901 Long term (current) use of anticoagulants: Secondary | ICD-10-CM | POA: Diagnosis not present

## 2019-11-05 DIAGNOSIS — D6869 Other thrombophilia: Secondary | ICD-10-CM | POA: Diagnosis not present

## 2019-11-05 DIAGNOSIS — I4891 Unspecified atrial fibrillation: Secondary | ICD-10-CM | POA: Diagnosis not present

## 2019-11-10 DIAGNOSIS — E039 Hypothyroidism, unspecified: Secondary | ICD-10-CM | POA: Diagnosis not present

## 2019-11-10 DIAGNOSIS — E1122 Type 2 diabetes mellitus with diabetic chronic kidney disease: Secondary | ICD-10-CM | POA: Diagnosis not present

## 2019-11-10 DIAGNOSIS — E782 Mixed hyperlipidemia: Secondary | ICD-10-CM | POA: Diagnosis not present

## 2019-11-10 DIAGNOSIS — N183 Chronic kidney disease, stage 3 unspecified: Secondary | ICD-10-CM | POA: Diagnosis not present

## 2019-11-19 DIAGNOSIS — D6869 Other thrombophilia: Secondary | ICD-10-CM | POA: Diagnosis not present

## 2019-11-19 DIAGNOSIS — Z7901 Long term (current) use of anticoagulants: Secondary | ICD-10-CM | POA: Diagnosis not present

## 2019-11-19 DIAGNOSIS — Z5181 Encounter for therapeutic drug level monitoring: Secondary | ICD-10-CM | POA: Diagnosis not present

## 2019-11-19 DIAGNOSIS — I4891 Unspecified atrial fibrillation: Secondary | ICD-10-CM | POA: Diagnosis not present

## 2019-11-22 DIAGNOSIS — D0511 Intraductal carcinoma in situ of right breast: Secondary | ICD-10-CM | POA: Diagnosis not present

## 2019-11-22 DIAGNOSIS — R921 Mammographic calcification found on diagnostic imaging of breast: Secondary | ICD-10-CM | POA: Diagnosis not present

## 2019-11-29 DIAGNOSIS — Z5181 Encounter for therapeutic drug level monitoring: Secondary | ICD-10-CM | POA: Diagnosis not present

## 2019-11-29 DIAGNOSIS — Z7901 Long term (current) use of anticoagulants: Secondary | ICD-10-CM | POA: Diagnosis not present

## 2019-11-29 DIAGNOSIS — I4891 Unspecified atrial fibrillation: Secondary | ICD-10-CM | POA: Diagnosis not present

## 2019-11-29 DIAGNOSIS — D6869 Other thrombophilia: Secondary | ICD-10-CM | POA: Diagnosis not present

## 2019-12-03 DIAGNOSIS — D0511 Intraductal carcinoma in situ of right breast: Secondary | ICD-10-CM | POA: Diagnosis not present

## 2019-12-10 DIAGNOSIS — E039 Hypothyroidism, unspecified: Secondary | ICD-10-CM | POA: Diagnosis not present

## 2019-12-10 DIAGNOSIS — N183 Chronic kidney disease, stage 3 unspecified: Secondary | ICD-10-CM | POA: Diagnosis not present

## 2019-12-10 DIAGNOSIS — E1122 Type 2 diabetes mellitus with diabetic chronic kidney disease: Secondary | ICD-10-CM | POA: Diagnosis not present

## 2019-12-10 DIAGNOSIS — E782 Mixed hyperlipidemia: Secondary | ICD-10-CM | POA: Diagnosis not present

## 2019-12-13 DIAGNOSIS — I4891 Unspecified atrial fibrillation: Secondary | ICD-10-CM | POA: Diagnosis not present

## 2019-12-13 DIAGNOSIS — Z5181 Encounter for therapeutic drug level monitoring: Secondary | ICD-10-CM | POA: Diagnosis not present

## 2019-12-13 DIAGNOSIS — Z7901 Long term (current) use of anticoagulants: Secondary | ICD-10-CM | POA: Diagnosis not present

## 2019-12-15 DIAGNOSIS — D0511 Intraductal carcinoma in situ of right breast: Secondary | ICD-10-CM | POA: Diagnosis not present

## 2019-12-27 DIAGNOSIS — C50919 Malignant neoplasm of unspecified site of unspecified female breast: Secondary | ICD-10-CM | POA: Diagnosis not present

## 2019-12-27 DIAGNOSIS — N183 Chronic kidney disease, stage 3 unspecified: Secondary | ICD-10-CM | POA: Diagnosis not present

## 2019-12-27 DIAGNOSIS — D0511 Intraductal carcinoma in situ of right breast: Secondary | ICD-10-CM | POA: Diagnosis not present

## 2019-12-27 DIAGNOSIS — E1122 Type 2 diabetes mellitus with diabetic chronic kidney disease: Secondary | ICD-10-CM | POA: Diagnosis not present

## 2019-12-27 DIAGNOSIS — Z01812 Encounter for preprocedural laboratory examination: Secondary | ICD-10-CM | POA: Diagnosis not present

## 2019-12-27 DIAGNOSIS — I4891 Unspecified atrial fibrillation: Secondary | ICD-10-CM | POA: Diagnosis not present

## 2019-12-31 DIAGNOSIS — Z5181 Encounter for therapeutic drug level monitoring: Secondary | ICD-10-CM | POA: Diagnosis not present

## 2019-12-31 DIAGNOSIS — Z7901 Long term (current) use of anticoagulants: Secondary | ICD-10-CM | POA: Diagnosis not present

## 2019-12-31 DIAGNOSIS — I4891 Unspecified atrial fibrillation: Secondary | ICD-10-CM | POA: Diagnosis not present

## 2020-01-03 DIAGNOSIS — I509 Heart failure, unspecified: Secondary | ICD-10-CM | POA: Diagnosis not present

## 2020-01-03 DIAGNOSIS — E785 Hyperlipidemia, unspecified: Secondary | ICD-10-CM | POA: Diagnosis not present

## 2020-01-03 DIAGNOSIS — E039 Hypothyroidism, unspecified: Secondary | ICD-10-CM | POA: Diagnosis not present

## 2020-01-03 DIAGNOSIS — N6021 Fibroadenosis of right breast: Secondary | ICD-10-CM | POA: Diagnosis not present

## 2020-01-03 DIAGNOSIS — D0511 Intraductal carcinoma in situ of right breast: Secondary | ICD-10-CM | POA: Diagnosis not present

## 2020-01-03 DIAGNOSIS — K219 Gastro-esophageal reflux disease without esophagitis: Secondary | ICD-10-CM | POA: Diagnosis not present

## 2020-01-03 DIAGNOSIS — I110 Hypertensive heart disease with heart failure: Secondary | ICD-10-CM | POA: Diagnosis not present

## 2020-01-03 DIAGNOSIS — I739 Peripheral vascular disease, unspecified: Secondary | ICD-10-CM | POA: Diagnosis not present

## 2020-01-03 DIAGNOSIS — Z87891 Personal history of nicotine dependence: Secondary | ICD-10-CM | POA: Diagnosis not present

## 2020-01-03 DIAGNOSIS — I4891 Unspecified atrial fibrillation: Secondary | ICD-10-CM | POA: Diagnosis not present

## 2020-01-03 DIAGNOSIS — E119 Type 2 diabetes mellitus without complications: Secondary | ICD-10-CM | POA: Diagnosis not present

## 2020-01-03 DIAGNOSIS — Z79899 Other long term (current) drug therapy: Secondary | ICD-10-CM | POA: Diagnosis not present

## 2020-01-03 DIAGNOSIS — I10 Essential (primary) hypertension: Secondary | ICD-10-CM | POA: Diagnosis not present

## 2020-01-10 DIAGNOSIS — E782 Mixed hyperlipidemia: Secondary | ICD-10-CM | POA: Diagnosis not present

## 2020-01-10 DIAGNOSIS — I4891 Unspecified atrial fibrillation: Secondary | ICD-10-CM | POA: Diagnosis not present

## 2020-01-10 DIAGNOSIS — E1169 Type 2 diabetes mellitus with other specified complication: Secondary | ICD-10-CM | POA: Diagnosis not present

## 2020-01-10 DIAGNOSIS — E1122 Type 2 diabetes mellitus with diabetic chronic kidney disease: Secondary | ICD-10-CM | POA: Diagnosis not present

## 2020-01-14 DIAGNOSIS — Z7901 Long term (current) use of anticoagulants: Secondary | ICD-10-CM | POA: Diagnosis not present

## 2020-01-14 DIAGNOSIS — I4891 Unspecified atrial fibrillation: Secondary | ICD-10-CM | POA: Diagnosis not present

## 2020-01-14 DIAGNOSIS — Z5181 Encounter for therapeutic drug level monitoring: Secondary | ICD-10-CM | POA: Diagnosis not present

## 2020-01-17 DIAGNOSIS — D0511 Intraductal carcinoma in situ of right breast: Secondary | ICD-10-CM | POA: Diagnosis not present

## 2020-01-17 DIAGNOSIS — C50312 Malignant neoplasm of lower-inner quadrant of left female breast: Secondary | ICD-10-CM | POA: Diagnosis not present

## 2020-01-20 DIAGNOSIS — Z7901 Long term (current) use of anticoagulants: Secondary | ICD-10-CM | POA: Diagnosis not present

## 2020-01-20 DIAGNOSIS — I4891 Unspecified atrial fibrillation: Secondary | ICD-10-CM | POA: Diagnosis not present

## 2020-01-20 DIAGNOSIS — C50919 Malignant neoplasm of unspecified site of unspecified female breast: Secondary | ICD-10-CM | POA: Diagnosis not present

## 2020-01-20 DIAGNOSIS — Z5181 Encounter for therapeutic drug level monitoring: Secondary | ICD-10-CM | POA: Diagnosis not present

## 2020-01-28 DIAGNOSIS — Z139 Encounter for screening, unspecified: Secondary | ICD-10-CM | POA: Diagnosis not present

## 2020-01-28 DIAGNOSIS — I4891 Unspecified atrial fibrillation: Secondary | ICD-10-CM | POA: Diagnosis not present

## 2020-01-28 DIAGNOSIS — Z5181 Encounter for therapeutic drug level monitoring: Secondary | ICD-10-CM | POA: Diagnosis not present

## 2020-01-28 DIAGNOSIS — Z7901 Long term (current) use of anticoagulants: Secondary | ICD-10-CM | POA: Diagnosis not present

## 2020-02-09 DIAGNOSIS — E1169 Type 2 diabetes mellitus with other specified complication: Secondary | ICD-10-CM | POA: Diagnosis not present

## 2020-02-09 DIAGNOSIS — E1122 Type 2 diabetes mellitus with diabetic chronic kidney disease: Secondary | ICD-10-CM | POA: Diagnosis not present

## 2020-02-09 DIAGNOSIS — E782 Mixed hyperlipidemia: Secondary | ICD-10-CM | POA: Diagnosis not present

## 2020-02-09 DIAGNOSIS — I4891 Unspecified atrial fibrillation: Secondary | ICD-10-CM | POA: Diagnosis not present

## 2020-02-16 DIAGNOSIS — E119 Type 2 diabetes mellitus without complications: Secondary | ICD-10-CM | POA: Diagnosis not present

## 2020-02-16 DIAGNOSIS — H2703 Aphakia, bilateral: Secondary | ICD-10-CM | POA: Diagnosis not present

## 2020-02-21 DIAGNOSIS — I4891 Unspecified atrial fibrillation: Secondary | ICD-10-CM | POA: Diagnosis not present

## 2020-02-21 DIAGNOSIS — Z7901 Long term (current) use of anticoagulants: Secondary | ICD-10-CM | POA: Diagnosis not present

## 2020-02-21 DIAGNOSIS — Z5181 Encounter for therapeutic drug level monitoring: Secondary | ICD-10-CM | POA: Diagnosis not present

## 2020-03-12 DIAGNOSIS — E1169 Type 2 diabetes mellitus with other specified complication: Secondary | ICD-10-CM | POA: Diagnosis not present

## 2020-03-12 DIAGNOSIS — E782 Mixed hyperlipidemia: Secondary | ICD-10-CM | POA: Diagnosis not present

## 2020-03-24 DIAGNOSIS — Z7901 Long term (current) use of anticoagulants: Secondary | ICD-10-CM | POA: Diagnosis not present

## 2020-03-24 DIAGNOSIS — I4891 Unspecified atrial fibrillation: Secondary | ICD-10-CM | POA: Diagnosis not present

## 2020-03-24 DIAGNOSIS — Z5181 Encounter for therapeutic drug level monitoring: Secondary | ICD-10-CM | POA: Diagnosis not present

## 2020-04-07 DIAGNOSIS — I4891 Unspecified atrial fibrillation: Secondary | ICD-10-CM | POA: Diagnosis not present

## 2020-04-07 DIAGNOSIS — Z7901 Long term (current) use of anticoagulants: Secondary | ICD-10-CM | POA: Diagnosis not present

## 2020-04-07 DIAGNOSIS — Z5181 Encounter for therapeutic drug level monitoring: Secondary | ICD-10-CM | POA: Diagnosis not present

## 2020-04-11 DIAGNOSIS — E782 Mixed hyperlipidemia: Secondary | ICD-10-CM | POA: Diagnosis not present

## 2020-04-11 DIAGNOSIS — E1169 Type 2 diabetes mellitus with other specified complication: Secondary | ICD-10-CM | POA: Diagnosis not present

## 2020-04-12 DIAGNOSIS — E1169 Type 2 diabetes mellitus with other specified complication: Secondary | ICD-10-CM | POA: Diagnosis not present

## 2020-04-12 DIAGNOSIS — E782 Mixed hyperlipidemia: Secondary | ICD-10-CM | POA: Diagnosis not present

## 2020-05-12 DIAGNOSIS — Z5181 Encounter for therapeutic drug level monitoring: Secondary | ICD-10-CM | POA: Diagnosis not present

## 2020-05-12 DIAGNOSIS — Z23 Encounter for immunization: Secondary | ICD-10-CM | POA: Diagnosis not present

## 2020-05-12 DIAGNOSIS — E782 Mixed hyperlipidemia: Secondary | ICD-10-CM | POA: Diagnosis not present

## 2020-05-12 DIAGNOSIS — E1169 Type 2 diabetes mellitus with other specified complication: Secondary | ICD-10-CM | POA: Diagnosis not present

## 2020-05-12 DIAGNOSIS — Z7901 Long term (current) use of anticoagulants: Secondary | ICD-10-CM | POA: Diagnosis not present

## 2020-05-12 DIAGNOSIS — I4891 Unspecified atrial fibrillation: Secondary | ICD-10-CM | POA: Diagnosis not present

## 2020-05-26 DIAGNOSIS — Z7901 Long term (current) use of anticoagulants: Secondary | ICD-10-CM | POA: Diagnosis not present

## 2020-05-26 DIAGNOSIS — Z5181 Encounter for therapeutic drug level monitoring: Secondary | ICD-10-CM | POA: Diagnosis not present

## 2020-05-26 DIAGNOSIS — I4891 Unspecified atrial fibrillation: Secondary | ICD-10-CM | POA: Diagnosis not present

## 2020-06-09 DIAGNOSIS — Z7901 Long term (current) use of anticoagulants: Secondary | ICD-10-CM | POA: Diagnosis not present

## 2020-06-09 DIAGNOSIS — E039 Hypothyroidism, unspecified: Secondary | ICD-10-CM | POA: Diagnosis not present

## 2020-06-09 DIAGNOSIS — I4891 Unspecified atrial fibrillation: Secondary | ICD-10-CM | POA: Diagnosis not present

## 2020-06-09 DIAGNOSIS — E1169 Type 2 diabetes mellitus with other specified complication: Secondary | ICD-10-CM | POA: Diagnosis not present

## 2020-06-09 DIAGNOSIS — Z5181 Encounter for therapeutic drug level monitoring: Secondary | ICD-10-CM | POA: Diagnosis not present

## 2020-06-12 DIAGNOSIS — E782 Mixed hyperlipidemia: Secondary | ICD-10-CM | POA: Diagnosis not present

## 2020-06-12 DIAGNOSIS — E1169 Type 2 diabetes mellitus with other specified complication: Secondary | ICD-10-CM | POA: Diagnosis not present

## 2020-07-24 DIAGNOSIS — I4891 Unspecified atrial fibrillation: Secondary | ICD-10-CM | POA: Diagnosis not present

## 2020-07-24 DIAGNOSIS — Z5181 Encounter for therapeutic drug level monitoring: Secondary | ICD-10-CM | POA: Diagnosis not present

## 2020-07-24 DIAGNOSIS — Z7901 Long term (current) use of anticoagulants: Secondary | ICD-10-CM | POA: Diagnosis not present

## 2020-07-30 NOTE — Progress Notes (Signed)
[facility]  [address] [place],  [place]  [address] [phone]  Clinic Day:  07/31/2020  Referring physician: No ref. provider found   HISTORY OF PRESENT ILLNESS:  The patient is a 86 y.o. 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 She has been taking tamoxifen 20 mg daily, which she will be on for 5 years of chemoprevention.  She comes in today for routine follow-up.  Since her last visit, the patient has been doing well.  She denies having any changes in her breasts which concern her for disease recurrence.     PHYSICAL EXAM:  There were no vitals taken for this visit. Wt Readings from Last 3 Encounters:  No data found for Wt   There is no height or weight on file to calculate BMI. Performance status (ECOG): 1 - Symptomatic but completely ambulatory Physical Exam Constitutional:      Appearance: Normal appearance.  HENT:     Mouth/Throat:     Pharynx: Oropharynx is clear. No oropharyngeal exudate.  Cardiovascular:     Rate and Rhythm: Normal rate and regular rhythm.     Heart sounds: No murmur heard. No friction rub. No gallop.   Pulmonary:     Breath sounds: Normal breath sounds.  Chest:  Breasts:     Right: No swelling, bleeding, inverted nipple, mass, nipple discharge, skin change, axillary adenopathy or supraclavicular adenopathy.     Left: Inverted nipple (postsurgical related) present. No swelling, bleeding, mass, nipple discharge, skin change, axillary adenopathy or supraclavicular adenopathy.    Abdominal:     General: Bowel sounds are normal. There is no distension.     Palpations: Abdomen is soft. There is no mass.     Tenderness: There is no abdominal tenderness.  Musculoskeletal:        General: No tenderness.     Cervical back: Normal range of motion and neck supple.     Right lower leg: No edema.     Left lower leg: No edema.  Lymphadenopathy:     Cervical: No  cervical adenopathy.     Right cervical: No superficial, deep or posterior cervical adenopathy.    Left cervical: No superficial, deep or posterior cervical adenopathy.     Upper Body:     Right upper body: No supraclavicular or axillary adenopathy.     Left upper body: No supraclavicular or axillary adenopathy.     Lower Body: No right inguinal adenopathy. No left inguinal adenopathy.  Skin:    Coloration: Skin is not jaundiced.     Findings: No lesion or rash.  Neurological:     General: No focal deficit present.     Mental Status: She is alert and oriented to person, place, and time. Mental status is at baseline.  Psychiatric:        Mood and Affect: Mood normal.        Behavior: Behavior normal.        Thought Content: Thought content normal.        Judgment: Judgment normal.    ASSESSMENT & PLAN:  Assessment/Plan:  A 86 y.o. female with hormone positive ductal carcinoma in situ.  Based upon her clinical breast exam today, the patient remains disease-free.  She knows to continue taking tamoxifen daily for a total of 5 years of chemoprevention.  With respect to her breast cancer surveillance, I will continue to follow her every 6 months with clinical  breast exams.  Her annual mammogram will be scheduled before her next visit for her continued radiographic breast cancer surveillance.  The patient understands all the plans discussed today and is in agreement with them.      Kimisha Eunice Macarthur Critchley, MD

## 2020-07-31 ENCOUNTER — Inpatient Hospital Stay: Payer: Medicare Other | Attending: Oncology | Admitting: Oncology

## 2020-07-31 ENCOUNTER — Telehealth: Payer: Self-pay | Admitting: Oncology

## 2020-07-31 ENCOUNTER — Encounter: Payer: Self-pay | Admitting: Oncology

## 2020-07-31 ENCOUNTER — Other Ambulatory Visit: Payer: Self-pay

## 2020-07-31 ENCOUNTER — Other Ambulatory Visit: Payer: Self-pay | Admitting: Oncology

## 2020-07-31 VITALS — BP 115/85 | HR 108 | Temp 98.0°F | Resp 16 | Ht 63.0 in | Wt 228.2 lb

## 2020-07-31 DIAGNOSIS — Z170 Estrogen receptor positive status [ER+]: Secondary | ICD-10-CM

## 2020-07-31 DIAGNOSIS — C50211 Malignant neoplasm of upper-inner quadrant of right female breast: Secondary | ICD-10-CM | POA: Diagnosis not present

## 2020-07-31 NOTE — Telephone Encounter (Signed)
Per 12/20 los next appt given to patient 

## 2020-08-12 DIAGNOSIS — E782 Mixed hyperlipidemia: Secondary | ICD-10-CM | POA: Diagnosis not present

## 2020-08-12 DIAGNOSIS — E1169 Type 2 diabetes mellitus with other specified complication: Secondary | ICD-10-CM | POA: Diagnosis not present

## 2020-08-12 DIAGNOSIS — N1832 Chronic kidney disease, stage 3b: Secondary | ICD-10-CM | POA: Diagnosis not present

## 2020-08-12 HISTORY — PX: CHOLECYSTECTOMY: SHX55

## 2020-08-25 DIAGNOSIS — Z0000 Encounter for general adult medical examination without abnormal findings: Secondary | ICD-10-CM | POA: Diagnosis not present

## 2020-08-25 DIAGNOSIS — Z9181 History of falling: Secondary | ICD-10-CM | POA: Diagnosis not present

## 2020-08-25 DIAGNOSIS — Z5181 Encounter for therapeutic drug level monitoring: Secondary | ICD-10-CM | POA: Diagnosis not present

## 2020-08-25 DIAGNOSIS — Z139 Encounter for screening, unspecified: Secondary | ICD-10-CM | POA: Diagnosis not present

## 2020-08-25 DIAGNOSIS — I4891 Unspecified atrial fibrillation: Secondary | ICD-10-CM | POA: Diagnosis not present

## 2020-09-08 DIAGNOSIS — Z7901 Long term (current) use of anticoagulants: Secondary | ICD-10-CM | POA: Diagnosis not present

## 2020-09-08 DIAGNOSIS — Z5181 Encounter for therapeutic drug level monitoring: Secondary | ICD-10-CM | POA: Diagnosis not present

## 2020-09-08 DIAGNOSIS — I4891 Unspecified atrial fibrillation: Secondary | ICD-10-CM | POA: Diagnosis not present

## 2020-09-12 DIAGNOSIS — N1832 Chronic kidney disease, stage 3b: Secondary | ICD-10-CM | POA: Diagnosis not present

## 2020-09-12 DIAGNOSIS — E782 Mixed hyperlipidemia: Secondary | ICD-10-CM | POA: Diagnosis not present

## 2020-09-12 DIAGNOSIS — I4891 Unspecified atrial fibrillation: Secondary | ICD-10-CM | POA: Diagnosis not present

## 2020-09-12 DIAGNOSIS — E1169 Type 2 diabetes mellitus with other specified complication: Secondary | ICD-10-CM | POA: Diagnosis not present

## 2020-09-15 DIAGNOSIS — E039 Hypothyroidism, unspecified: Secondary | ICD-10-CM | POA: Diagnosis not present

## 2020-09-15 DIAGNOSIS — E1169 Type 2 diabetes mellitus with other specified complication: Secondary | ICD-10-CM | POA: Diagnosis not present

## 2020-09-22 DIAGNOSIS — I129 Hypertensive chronic kidney disease with stage 1 through stage 4 chronic kidney disease, or unspecified chronic kidney disease: Secondary | ICD-10-CM | POA: Diagnosis not present

## 2020-09-22 DIAGNOSIS — E782 Mixed hyperlipidemia: Secondary | ICD-10-CM | POA: Diagnosis not present

## 2020-09-22 DIAGNOSIS — I4891 Unspecified atrial fibrillation: Secondary | ICD-10-CM | POA: Diagnosis not present

## 2020-09-22 DIAGNOSIS — E039 Hypothyroidism, unspecified: Secondary | ICD-10-CM | POA: Diagnosis not present

## 2020-09-22 DIAGNOSIS — E1169 Type 2 diabetes mellitus with other specified complication: Secondary | ICD-10-CM | POA: Diagnosis not present

## 2020-09-22 DIAGNOSIS — N183 Chronic kidney disease, stage 3 unspecified: Secondary | ICD-10-CM | POA: Diagnosis not present

## 2020-09-22 DIAGNOSIS — Z7901 Long term (current) use of anticoagulants: Secondary | ICD-10-CM | POA: Diagnosis not present

## 2020-10-06 DIAGNOSIS — Z5181 Encounter for therapeutic drug level monitoring: Secondary | ICD-10-CM | POA: Diagnosis not present

## 2020-10-06 DIAGNOSIS — Z7901 Long term (current) use of anticoagulants: Secondary | ICD-10-CM | POA: Diagnosis not present

## 2020-10-06 DIAGNOSIS — I4891 Unspecified atrial fibrillation: Secondary | ICD-10-CM | POA: Diagnosis not present

## 2020-10-10 DIAGNOSIS — N183 Chronic kidney disease, stage 3 unspecified: Secondary | ICD-10-CM | POA: Diagnosis not present

## 2020-10-10 DIAGNOSIS — I129 Hypertensive chronic kidney disease with stage 1 through stage 4 chronic kidney disease, or unspecified chronic kidney disease: Secondary | ICD-10-CM | POA: Diagnosis not present

## 2020-10-10 DIAGNOSIS — E1169 Type 2 diabetes mellitus with other specified complication: Secondary | ICD-10-CM | POA: Diagnosis not present

## 2020-10-10 DIAGNOSIS — E039 Hypothyroidism, unspecified: Secondary | ICD-10-CM | POA: Diagnosis not present

## 2020-11-03 DIAGNOSIS — Z7901 Long term (current) use of anticoagulants: Secondary | ICD-10-CM | POA: Diagnosis not present

## 2020-11-03 DIAGNOSIS — D692 Other nonthrombocytopenic purpura: Secondary | ICD-10-CM | POA: Diagnosis not present

## 2020-11-08 ENCOUNTER — Encounter: Payer: Self-pay | Admitting: Oncology

## 2020-11-08 DIAGNOSIS — Z853 Personal history of malignant neoplasm of breast: Secondary | ICD-10-CM | POA: Diagnosis not present

## 2020-11-08 DIAGNOSIS — R928 Other abnormal and inconclusive findings on diagnostic imaging of breast: Secondary | ICD-10-CM | POA: Diagnosis not present

## 2020-11-09 ENCOUNTER — Other Ambulatory Visit: Payer: Self-pay | Admitting: Oncology

## 2020-11-10 DIAGNOSIS — I4891 Unspecified atrial fibrillation: Secondary | ICD-10-CM | POA: Diagnosis not present

## 2020-11-10 DIAGNOSIS — E1169 Type 2 diabetes mellitus with other specified complication: Secondary | ICD-10-CM | POA: Diagnosis not present

## 2020-11-10 DIAGNOSIS — I129 Hypertensive chronic kidney disease with stage 1 through stage 4 chronic kidney disease, or unspecified chronic kidney disease: Secondary | ICD-10-CM | POA: Diagnosis not present

## 2020-12-08 DIAGNOSIS — I4891 Unspecified atrial fibrillation: Secondary | ICD-10-CM | POA: Diagnosis not present

## 2020-12-08 DIAGNOSIS — E1122 Type 2 diabetes mellitus with diabetic chronic kidney disease: Secondary | ICD-10-CM | POA: Diagnosis not present

## 2020-12-08 DIAGNOSIS — Z7901 Long term (current) use of anticoagulants: Secondary | ICD-10-CM | POA: Diagnosis not present

## 2020-12-08 DIAGNOSIS — Z5181 Encounter for therapeutic drug level monitoring: Secondary | ICD-10-CM | POA: Diagnosis not present

## 2020-12-10 DIAGNOSIS — I4891 Unspecified atrial fibrillation: Secondary | ICD-10-CM | POA: Diagnosis not present

## 2020-12-10 DIAGNOSIS — I129 Hypertensive chronic kidney disease with stage 1 through stage 4 chronic kidney disease, or unspecified chronic kidney disease: Secondary | ICD-10-CM | POA: Diagnosis not present

## 2020-12-10 DIAGNOSIS — E1169 Type 2 diabetes mellitus with other specified complication: Secondary | ICD-10-CM | POA: Diagnosis not present

## 2021-01-05 DIAGNOSIS — Z5181 Encounter for therapeutic drug level monitoring: Secondary | ICD-10-CM | POA: Diagnosis not present

## 2021-01-05 DIAGNOSIS — I4891 Unspecified atrial fibrillation: Secondary | ICD-10-CM | POA: Diagnosis not present

## 2021-01-05 DIAGNOSIS — Z7901 Long term (current) use of anticoagulants: Secondary | ICD-10-CM | POA: Diagnosis not present

## 2021-01-10 DIAGNOSIS — I129 Hypertensive chronic kidney disease with stage 1 through stage 4 chronic kidney disease, or unspecified chronic kidney disease: Secondary | ICD-10-CM | POA: Diagnosis not present

## 2021-01-10 DIAGNOSIS — E1169 Type 2 diabetes mellitus with other specified complication: Secondary | ICD-10-CM | POA: Diagnosis not present

## 2021-01-10 DIAGNOSIS — I4891 Unspecified atrial fibrillation: Secondary | ICD-10-CM | POA: Diagnosis not present

## 2021-01-19 DIAGNOSIS — E1169 Type 2 diabetes mellitus with other specified complication: Secondary | ICD-10-CM | POA: Diagnosis not present

## 2021-01-19 DIAGNOSIS — E039 Hypothyroidism, unspecified: Secondary | ICD-10-CM | POA: Diagnosis not present

## 2021-01-25 NOTE — Progress Notes (Signed)
[facility]  [address] [place],  [place]  [address] [phone]  Clinic Day:  01/29/2021  Referring physician: Marco Collie, MD  This document serves as a record of services personally performed by Dequincy Macarthur Critchley, MD. It was created on their behalf by [facility], a trained medical scribe. The creation of this record is based on the scribe's personal observations and the provider's statements to them.  HISTORY OF PRESENT ILLNESS:  The patient is a 87 y.o. 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 She has been taking tamoxifen 20 mg daily, which she will be on for 5 years of chemoprevention.  She comes in today for routine follow-up.  Since her last visit, the patient has been doing well.  She denies having any changes in her breasts which concern her for disease recurrence.  Of note, her mammogram in March 2022 showed no evidence of disease recurrence.    PHYSICAL EXAM:  Blood pressure 109/76, pulse 99, temperature 97.8 F (36.6 C), resp. rate 16, height 5\' 3"  (1.6 m), weight 206 lb 1.6 oz (93.5 kg), SpO2 94 %. Wt Readings from Last 3 Encounters:  01/29/21 206 lb 1.6 oz (93.5 kg)  07/31/20 228 lb 3.2 oz (103.5 kg)   Body mass index is 36.51 kg/m. Performance status (ECOG): 1 - Symptomatic but completely ambulatory Physical Exam Constitutional:      Appearance: Normal appearance.  HENT:     Mouth/Throat:     Pharynx: Oropharynx is clear. No oropharyngeal exudate.  Cardiovascular:     Rate and Rhythm: Normal rate and regular rhythm.     Heart sounds: No murmur heard.   No friction rub. No gallop.  Pulmonary:     Breath sounds: Normal breath sounds.  Chest:  Breasts:    Right: No swelling, bleeding, inverted nipple, mass, nipple discharge, skin change, axillary adenopathy or supraclavicular adenopathy.     Left: Inverted nipple (postsurgical related) present. No swelling, bleeding, mass,  nipple discharge, skin change, axillary adenopathy or supraclavicular adenopathy.  Abdominal:     General: Bowel sounds are normal. There is no distension.     Palpations: Abdomen is soft. There is no mass.     Tenderness: There is no abdominal tenderness.  Musculoskeletal:        General: No tenderness.     Cervical back: Normal range of motion and neck supple.     Right lower leg: No edema.     Left lower leg: No edema.  Lymphadenopathy:     Cervical: No cervical adenopathy.     Right cervical: No superficial, deep or posterior cervical adenopathy.    Left cervical: No superficial, deep or posterior cervical adenopathy.     Upper Body:     Right upper body: No supraclavicular or axillary adenopathy.     Left upper body: No supraclavicular or axillary adenopathy.     Lower Body: No right inguinal adenopathy. No left inguinal adenopathy.  Skin:    Coloration: Skin is not jaundiced.     Findings: No lesion or rash.  Neurological:     General: No focal deficit present.     Mental Status: She is alert and oriented to person, place, and time. Mental status is at baseline.  Psychiatric:        Mood and Affect: Mood normal.        Behavior: Behavior normal.        Thought  Content: Thought content normal.        Judgment: Judgment normal.   ASSESSMENT & PLAN:  Assessment/Plan:  A 87 y.o. female with hormone positive ductal carcinoma in situ.  Based upon her clinical breast exam today and recent mammogram, the patient remains disease-free.  She knows to continue taking tamoxifen daily for a total of 5 years of chemoprevention.  With respect to her breast cancer surveillance, I will continue to follow her every 6 months with clinical breast exams.  The patient understands all the plans discussed today and is in agreement with them.     I, Rita Ohara, am acting as scribe for Marice Potter, MD    I have reviewed this report as typed by the medical scribe, and it is complete and  accurate.  Dequincy Macarthur Critchley, MD

## 2021-01-26 DIAGNOSIS — N183 Chronic kidney disease, stage 3 unspecified: Secondary | ICD-10-CM | POA: Diagnosis not present

## 2021-01-26 DIAGNOSIS — Z0000 Encounter for general adult medical examination without abnormal findings: Secondary | ICD-10-CM | POA: Diagnosis not present

## 2021-01-26 DIAGNOSIS — I4891 Unspecified atrial fibrillation: Secondary | ICD-10-CM | POA: Diagnosis not present

## 2021-01-26 DIAGNOSIS — E782 Mixed hyperlipidemia: Secondary | ICD-10-CM | POA: Diagnosis not present

## 2021-01-26 DIAGNOSIS — E1169 Type 2 diabetes mellitus with other specified complication: Secondary | ICD-10-CM | POA: Diagnosis not present

## 2021-01-26 DIAGNOSIS — I129 Hypertensive chronic kidney disease with stage 1 through stage 4 chronic kidney disease, or unspecified chronic kidney disease: Secondary | ICD-10-CM | POA: Diagnosis not present

## 2021-01-26 DIAGNOSIS — E039 Hypothyroidism, unspecified: Secondary | ICD-10-CM | POA: Diagnosis not present

## 2021-01-29 ENCOUNTER — Other Ambulatory Visit: Payer: Self-pay

## 2021-01-29 ENCOUNTER — Inpatient Hospital Stay: Payer: Medicare Other | Attending: Oncology | Admitting: Oncology

## 2021-01-29 ENCOUNTER — Telehealth: Payer: Self-pay | Admitting: Oncology

## 2021-01-29 DIAGNOSIS — D0512 Intraductal carcinoma in situ of left breast: Secondary | ICD-10-CM

## 2021-01-29 DIAGNOSIS — D0510 Intraductal carcinoma in situ of unspecified breast: Secondary | ICD-10-CM | POA: Insufficient documentation

## 2021-01-29 NOTE — Telephone Encounter (Signed)
Per 6/20 los next appt scheduled and given to patient 

## 2021-02-09 DIAGNOSIS — I4891 Unspecified atrial fibrillation: Secondary | ICD-10-CM | POA: Diagnosis not present

## 2021-02-09 DIAGNOSIS — I129 Hypertensive chronic kidney disease with stage 1 through stage 4 chronic kidney disease, or unspecified chronic kidney disease: Secondary | ICD-10-CM | POA: Diagnosis not present

## 2021-02-09 DIAGNOSIS — N183 Chronic kidney disease, stage 3 unspecified: Secondary | ICD-10-CM | POA: Diagnosis not present

## 2021-02-09 DIAGNOSIS — Z7901 Long term (current) use of anticoagulants: Secondary | ICD-10-CM | POA: Diagnosis not present

## 2021-02-09 DIAGNOSIS — Z5181 Encounter for therapeutic drug level monitoring: Secondary | ICD-10-CM | POA: Diagnosis not present

## 2021-02-09 DIAGNOSIS — D6869 Other thrombophilia: Secondary | ICD-10-CM | POA: Diagnosis not present

## 2021-03-12 DIAGNOSIS — N183 Chronic kidney disease, stage 3 unspecified: Secondary | ICD-10-CM | POA: Diagnosis not present

## 2021-03-12 DIAGNOSIS — I129 Hypertensive chronic kidney disease with stage 1 through stage 4 chronic kidney disease, or unspecified chronic kidney disease: Secondary | ICD-10-CM | POA: Diagnosis not present

## 2021-03-12 DIAGNOSIS — I4891 Unspecified atrial fibrillation: Secondary | ICD-10-CM | POA: Diagnosis not present

## 2021-03-19 DIAGNOSIS — D6869 Other thrombophilia: Secondary | ICD-10-CM | POA: Diagnosis not present

## 2021-03-19 DIAGNOSIS — Z5181 Encounter for therapeutic drug level monitoring: Secondary | ICD-10-CM | POA: Diagnosis not present

## 2021-03-19 DIAGNOSIS — Z7901 Long term (current) use of anticoagulants: Secondary | ICD-10-CM | POA: Diagnosis not present

## 2021-03-28 DIAGNOSIS — H2703 Aphakia, bilateral: Secondary | ICD-10-CM | POA: Diagnosis not present

## 2021-03-28 DIAGNOSIS — E119 Type 2 diabetes mellitus without complications: Secondary | ICD-10-CM | POA: Diagnosis not present

## 2021-04-12 DIAGNOSIS — I129 Hypertensive chronic kidney disease with stage 1 through stage 4 chronic kidney disease, or unspecified chronic kidney disease: Secondary | ICD-10-CM | POA: Diagnosis not present

## 2021-04-12 DIAGNOSIS — I4891 Unspecified atrial fibrillation: Secondary | ICD-10-CM | POA: Diagnosis not present

## 2021-04-12 DIAGNOSIS — N183 Chronic kidney disease, stage 3 unspecified: Secondary | ICD-10-CM | POA: Diagnosis not present

## 2021-04-20 DIAGNOSIS — D6869 Other thrombophilia: Secondary | ICD-10-CM | POA: Diagnosis not present

## 2021-04-20 DIAGNOSIS — Z5181 Encounter for therapeutic drug level monitoring: Secondary | ICD-10-CM | POA: Diagnosis not present

## 2021-04-20 DIAGNOSIS — Z7901 Long term (current) use of anticoagulants: Secondary | ICD-10-CM | POA: Diagnosis not present

## 2021-04-20 DIAGNOSIS — Z23 Encounter for immunization: Secondary | ICD-10-CM | POA: Diagnosis not present

## 2021-05-12 DIAGNOSIS — I4891 Unspecified atrial fibrillation: Secondary | ICD-10-CM | POA: Diagnosis not present

## 2021-05-12 DIAGNOSIS — E1169 Type 2 diabetes mellitus with other specified complication: Secondary | ICD-10-CM | POA: Diagnosis not present

## 2021-05-12 DIAGNOSIS — I129 Hypertensive chronic kidney disease with stage 1 through stage 4 chronic kidney disease, or unspecified chronic kidney disease: Secondary | ICD-10-CM | POA: Diagnosis not present

## 2021-05-18 DIAGNOSIS — E1169 Type 2 diabetes mellitus with other specified complication: Secondary | ICD-10-CM | POA: Diagnosis not present

## 2021-05-18 DIAGNOSIS — E039 Hypothyroidism, unspecified: Secondary | ICD-10-CM | POA: Diagnosis not present

## 2021-05-25 DIAGNOSIS — Z7901 Long term (current) use of anticoagulants: Secondary | ICD-10-CM | POA: Diagnosis not present

## 2021-05-25 DIAGNOSIS — Z5181 Encounter for therapeutic drug level monitoring: Secondary | ICD-10-CM | POA: Diagnosis not present

## 2021-05-25 DIAGNOSIS — I4891 Unspecified atrial fibrillation: Secondary | ICD-10-CM | POA: Diagnosis not present

## 2021-06-12 DIAGNOSIS — E1169 Type 2 diabetes mellitus with other specified complication: Secondary | ICD-10-CM | POA: Diagnosis not present

## 2021-06-12 DIAGNOSIS — I129 Hypertensive chronic kidney disease with stage 1 through stage 4 chronic kidney disease, or unspecified chronic kidney disease: Secondary | ICD-10-CM | POA: Diagnosis not present

## 2021-06-12 DIAGNOSIS — I4891 Unspecified atrial fibrillation: Secondary | ICD-10-CM | POA: Diagnosis not present

## 2021-06-22 DIAGNOSIS — Z5181 Encounter for therapeutic drug level monitoring: Secondary | ICD-10-CM | POA: Diagnosis not present

## 2021-06-22 DIAGNOSIS — I4891 Unspecified atrial fibrillation: Secondary | ICD-10-CM | POA: Diagnosis not present

## 2021-06-22 DIAGNOSIS — Z7901 Long term (current) use of anticoagulants: Secondary | ICD-10-CM | POA: Diagnosis not present

## 2021-06-25 DIAGNOSIS — Z7901 Long term (current) use of anticoagulants: Secondary | ICD-10-CM | POA: Diagnosis not present

## 2021-06-25 DIAGNOSIS — I482 Chronic atrial fibrillation, unspecified: Secondary | ICD-10-CM | POA: Diagnosis not present

## 2021-06-25 DIAGNOSIS — Z5181 Encounter for therapeutic drug level monitoring: Secondary | ICD-10-CM | POA: Diagnosis not present

## 2021-06-29 ENCOUNTER — Other Ambulatory Visit: Payer: Self-pay

## 2021-06-29 ENCOUNTER — Emergency Department (HOSPITAL_COMMUNITY): Payer: Medicare Other

## 2021-06-29 ENCOUNTER — Emergency Department (HOSPITAL_COMMUNITY)
Admission: EM | Admit: 2021-06-29 | Discharge: 2021-06-30 | Disposition: A | Discharge status: Home / Self Care | Payer: Medicare Other | Attending: Emergency Medicine | Admitting: Emergency Medicine

## 2021-06-29 ENCOUNTER — Encounter (HOSPITAL_COMMUNITY): Payer: Self-pay | Admitting: Emergency Medicine

## 2021-06-29 DIAGNOSIS — Z853 Personal history of malignant neoplasm of breast: Secondary | ICD-10-CM | POA: Diagnosis not present

## 2021-06-29 DIAGNOSIS — Z7901 Long term (current) use of anticoagulants: Secondary | ICD-10-CM | POA: Insufficient documentation

## 2021-06-29 DIAGNOSIS — S61412A Laceration without foreign body of left hand, initial encounter: Secondary | ICD-10-CM | POA: Diagnosis not present

## 2021-06-29 DIAGNOSIS — W010XXA Fall on same level from slipping, tripping and stumbling without subsequent striking against object, initial encounter: Secondary | ICD-10-CM | POA: Insufficient documentation

## 2021-06-29 DIAGNOSIS — Z23 Encounter for immunization: Secondary | ICD-10-CM | POA: Diagnosis not present

## 2021-06-29 DIAGNOSIS — W19XXXA Unspecified fall, initial encounter: Secondary | ICD-10-CM

## 2021-06-29 DIAGNOSIS — S0990XA Unspecified injury of head, initial encounter: Secondary | ICD-10-CM | POA: Diagnosis not present

## 2021-06-29 DIAGNOSIS — S6992XA Unspecified injury of left wrist, hand and finger(s), initial encounter: Secondary | ICD-10-CM | POA: Diagnosis present

## 2021-06-29 DIAGNOSIS — I4891 Unspecified atrial fibrillation: Secondary | ICD-10-CM | POA: Insufficient documentation

## 2021-06-29 DIAGNOSIS — Y92009 Unspecified place in unspecified non-institutional (private) residence as the place of occurrence of the external cause: Secondary | ICD-10-CM | POA: Diagnosis not present

## 2021-06-29 DIAGNOSIS — Y9301 Activity, walking, marching and hiking: Secondary | ICD-10-CM | POA: Insufficient documentation

## 2021-06-29 MED ORDER — TETANUS-DIPHTH-ACELL PERTUSSIS 5-2.5-18.5 LF-MCG/0.5 IM SUSY
0.5000 mL | PREFILLED_SYRINGE | Freq: Once | INTRAMUSCULAR | Status: AC
  Administered 2021-06-30: 0.5 mL via INTRAMUSCULAR
  Filled 2021-06-29: qty 0.5

## 2021-06-29 NOTE — ED Provider Notes (Signed)
Emergency Medicine Provider Triage Evaluation Note  Sara Merritt , a 87 y.o. female  was evaluated in triage.  Pt complains of laceration to left hand.  Approximately 1700/1800 patient suffered a mechanical fall.  Patient denies seeing her head or any loss of consciousness.  Patient states that she could not get herself off the ground and had to crawl to the phone.  Patient estimates that she was on the ground for approximately 30 minutes before help arrived.  Patient reports that she takes Coumadin.  Patient is unsure when her last tetanus shot was.  Review of Systems  Positive: Laceration left hand, Negative: Neck pain, back pain, lightheadedness, syncope, numbness, weakness, saddle anesthesia  Physical Exam  BP (!) 106/47   Pulse (!) 105   Temp (!) 97.3 F (36.3 C) (Oral)   Resp 15   SpO2 96%  Gen:   Awake, no distress   Resp:  Normal effort  MSK:   Moves extremities without difficulty; approximate 5 cm laceration to left thumb pad. Other:  +2 left radial pulse.  Patient has motor and sensation intact to all digits of left hand.  Head is atraumatic.  No midline tenderness deformity to cervical, thoracic, or lumbar spine.  Medical Decision Making  Medically screening exam initiated at 9:18 PM.  Appropriate orders placed.  Sara Merritt was informed that the remainder of the evaluation will be completed by another provider, this initial triage assessment does not replace that evaluation, and the importance of remaining in the ED until their evaluation is complete.  Due to fall on thinners will obtain noncontrast head CT.  Will obtain x-ray imaging of left hand to evaluate for foreign bodies as well as fractures.   [name] 06/29/21 2120    Milton Ferguson, MD 06/29/21 2212

## 2021-06-29 NOTE — ED Triage Notes (Signed)
Patient fell tonight around 6pm and has 5cm lac on thumb pad of left hand.  Bleeding controlled.  She is on a blood thinner.  Patient denies hitting her head and denies any neck pain.  She does have pain to left hand at the thumb and pad.

## 2021-06-30 MED ORDER — LIDOCAINE-EPINEPHRINE (PF) 2 %-1:200000 IJ SOLN
20.0000 mL | Freq: Once | INTRAMUSCULAR | Status: AC
  Administered 2021-06-30: 20 mL
  Filled 2021-06-30: qty 20

## 2021-06-30 NOTE — ED Provider Notes (Signed)
[facility] EMERGENCY DEPARTMENT Provider Note   CSN: [id number] Arrival date & time: 06/29/21  2016     History Chief Complaint  Patient presents with   Laceration    Sara Merritt is a 87 y.o. female.  Patient with history of afib on Coumadin presents today for fall. She states that same occurred yesterday around 6pm when she was walking in her house. She states that the housekeeper had waxed the floors and she slipped. Denies hitting her head or loss of consciousness. Laceration present to palm of left hand, she believes she cut it on her dog bowl upon her fall. She denies any other injuries. Bleeding is controlled.  The history is provided by the patient. No language interpreter was used.  Laceration Associated symptoms: no fever and no rash       History reviewed. No pertinent past medical history.  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 No pertinent surgical history.   OB History   No obstetric history on file.     No family history on file.  Social History   Tobacco Use   Smoking status: Former   Smokeless tobacco: Never    Home Medications Prior to Admission medications   Medication Sig Start Date End Date Taking? Authorizing Provider  acetaminophen (TYLENOL) 500 MG tablet Take by mouth.    [provider]  allopurinol (ZYLOPRIM) 100 MG tablet Take by mouth.    [provider]  calcium carbonate (OS-CAL) 1250 (500 Ca) MG chewable tablet Chew 1 tablet by mouth daily.    [provider]  calcium-vitamin D (OSCAL WITH D) 500-200 MG-UNIT TABS tablet Take by mouth.    [provider]  CARTIA XT 240 MG 24 hr capsule Take 240 mg by mouth daily. 05/19/20   [provider]  colchicine 0.6 MG tablet Take by mouth.    [provider]  diltiazem (TIAZAC) 240 MG 24 hr capsule  12/09/19   [provider]  finasteride (PROSCAR) 5 MG tablet Take by mouth.    [provider]  furosemide (LASIX) 20 MG tablet Take by mouth.    [provider]  levothyroxine (SYNTHROID) 75 MCG tablet Take by mouth.    [provider]  metoprolol tartrate (LOPRESSOR) 50 MG tablet Take by mouth.    [provider]  omeprazole (PRILOSEC) 20 MG capsule Take by mouth.    [provider]  simvastatin (ZOCOR) 20 MG tablet Take by mouth.    [provider]  tamoxifen (NOLVADEX) 20 MG tablet TAKE 1 TABLET BY MOUTH  DAILY 11/13/20   Mosher, Vida Roller A, PA-C  triamcinolone lotion (KENALOG) 0.1 % Apply topically.    [provider]  warfarin (COUMADIN) 2.5 MG tablet Take by mouth.    [provider]  warfarin (COUMADIN) 3 MG tablet Take 3 mg by mouth daily. 07/27/20   [provider]  warfarin (COUMADIN) 4 MG tablet Take 4 mg by mouth daily. 07/27/20   [provider]    Allergies    Patient has no known allergies.  Review of Systems   Review of Systems  Constitutional:  Negative for chills and fever.  Respiratory:  Negative for apnea, cough, choking, chest tightness, shortness of breath, wheezing and stridor.   Cardiovascular:  Negative for chest pain, palpitations and leg swelling.  Gastrointestinal:  Negative for abdominal pain,  diarrhea, nausea and vomiting.  Musculoskeletal:  Negative for arthralgias, back pain, gait problem, joint swelling, myalgias, neck pain and neck stiffness.  Skin:  Positive for wound. Negative for color change, pallor and rash.  Neurological:  Negative for dizziness, tremors, seizures, syncope, facial asymmetry, speech difficulty, weakness, light-headedness, numbness and headaches.  Psychiatric/Behavioral:  Negative for behavioral problems and confusion.   All other systems reviewed and are negative.  Physical Exam Updated Vital Signs BP 117/79   Pulse (!) 117   Temp 97.8 F (36.6 C) (Oral)    Resp 16   SpO2 99%   Physical Exam Vitals and nursing note reviewed.  Constitutional:      General: She is not in acute distress.    Appearance: Normal appearance. She is normal weight. She is not ill-appearing, toxic-appearing or diaphoretic.  HENT:     Head: Normocephalic and atraumatic.  Eyes:     Extraocular Movements: Extraocular movements intact.     Conjunctiva/sclera: Conjunctivae normal.     Pupils: Pupils are equal, round, and reactive to light.  Cardiovascular:     Rate and Rhythm: Normal rate and regular rhythm.     Pulses: Normal pulses.     Heart sounds: Normal heart sounds.  Pulmonary:     Effort: Pulmonary effort is normal.     Breath sounds: Normal breath sounds.  Abdominal:     General: Abdomen is flat. Bowel sounds are normal.     Palpations: Abdomen is soft.  Musculoskeletal:        General: Normal range of motion.     Cervical back: Normal range of motion and neck supple.     Comments: Full ROM and 5/5 strength to bilateral upper and lower extremities. No bruising or deformity noted.  Skin:    General: Skin is warm and dry.     Capillary Refill: Capillary refill takes less than 2 seconds.     Comments: 5 cm laceration noted over the left thenar eminence of the left palm. Bleeding controlled. Distal pulses and sensation intact.  Neurological:     General: No focal deficit present.     Mental Status: She is alert and oriented to person, place, and time. Mental status is at baseline.  Psychiatric:        Mood and Affect: Mood normal.        Behavior: Behavior normal.    ED Results / Procedures / Treatments   Labs (all labs ordered are listed, but only abnormal results are displayed) Labs Reviewed - No data to display  EKG None  Radiology CT HEAD WO CONTRAST (5MM)  Result Date: 06/29/2021 CLINICAL DATA:  Fall, head injury EXAM: CT HEAD WITHOUT CONTRAST TECHNIQUE: Contiguous axial images were obtained from the base of the skull through the vertex  without intravenous contrast. COMPARISON:  None. FINDINGS: Brain: Normal anatomic configuration. Parenchymal volume loss is commensurate with the patient's age. Mild periventricular white matter changes are present likely reflecting the sequela of small vessel ischemia. No abnormal intra or extra-axial mass lesion or fluid collection. No abnormal mass effect or midline shift. No evidence of acute intracranial hemorrhage or infarct. Ventricular size is normal. Cerebellum unremarkable. Vascular: No asymmetric hyperdense vasculature at the skull base. Skull: Intact Sinuses/Orbits: Paranasal sinuses are clear. Orbits are unremarkable. Other: Mastoid air cells and middle ear cavities are clear. IMPRESSION: No acute intracranial injury.  No calvarial fracture. Electronically Signed   By: Fidela Salisbury M.D.   On: 06/29/2021 21:46   DG Hand  Complete Left  Result Date: 06/29/2021 CLINICAL DATA:  Fall, laceration. EXAM: LEFT HAND - COMPLETE 3+ VIEW COMPARISON:  None. FINDINGS: There is no evidence of fracture or dislocation. There is no evidence of arthropathy or other focal bone abnormality. Soft tissues are unremarkable. There is no radiopaque foreign body. IMPRESSION: Negative. Electronically Signed   By: Ronney Asters M.D.   On: 06/29/2021 21:38    Procedures .Marland KitchenLaceration Repair  Date/Time: 06/30/2021 9:58 AM Performed by: Bud Face, PA-C Authorized by: Bud Face, PA-C   Consent:    Consent obtained:  Verbal   Consent given by:  Patient   Risks, benefits, and alternatives were discussed: yes     Risks discussed:  Infection and pain   Alternatives discussed:  No treatment and delayed treatment Universal protocol:    Procedure explained and questions answered to patient or proxy's satisfaction: yes     Test results available: yes     Imaging studies available: yes     Patient identity confirmed:  Verbally with patient Anesthesia:    Anesthesia method:  Local infiltration   Local  anesthetic:  Lidocaine 2% WITH epi Laceration details:    Location:  Hand   Hand location:  L palm   Length (cm):  5   Depth (mm):  2 Treatment:    Area cleansed with:  Chlorhexidine   Amount of cleaning:  Extensive   Irrigation solution:  Sterile saline   Irrigation volume:  1 L   Irrigation method:  Pressure wash and syringe   Visualized foreign bodies/material removed: no   Skin repair:    Repair method:  Sutures and Steri-Strips   Suture size:  4-0   Suture material:  Prolene   Suture technique:  Simple interrupted   Number of sutures:  6   Number of Steri-Strips:  4 Approximation:    Approximation:  Close Repair type:    Repair type:  Simple Post-procedure details:    Dressing:  Antibiotic ointment and non-adherent dressing   Procedure completion:  Tolerated well, no immediate complications   Medications Ordered in ED Medications  lidocaine-EPINEPHrine (XYLOCAINE W/EPI) 2 %-1:200000 (PF) injection 20 mL (has no administration in time range)  Tdap (BOOSTRIX) injection 0.5 mL (0.5 mLs Intramuscular Given 06/30/21 0816)    ED Course  I have reviewed the triage vital signs and the nursing notes.  Pertinent labs & imaging results that were available during my care of the patient were reviewed by me and considered in my medical decision making (see chart for details).    MDM Rules/Calculators/A&P                         Patient presents today with laceration after mechanical fall on Coumadin. Imaging unremarkable for acute findings. Patient is afebrile, non-toxic appearing, and in no acute distress. Pressure irrigation performed. Wound explored and base of wound visualized in a bloodless field without evidence of foreign body.  Laceration repair was well tolerated. Tdap updated.  Pt has  no comorbidities to effect normal wound healing. Pt discharged  without antibiotics.  Discussed suture home care with patient and answered questions. Pt to follow-up for wound check and  suture removal in 7 days; they are to return to the ED sooner for signs of infection. Pt is hemodynamically stable with no complaints prior to dc.    This is a shared visit with supervising physician Dr. Rogene Houston who has independently evaluated patient & provided guidance  in evaluation/management/disposition, in agreement with care    Final Clinical Impression(s) / ED Diagnoses Final diagnoses:  Laceration of left hand without foreign body, initial encounter  Fall, initial encounter    Rx / DC Orders ED Discharge Orders     None     An After Visit Summary was printed and given to the patient.    Nestor Lewandowsky 06/30/21 1006    Fredia Sorrow, MD 07/01/21 [phone]

## 2021-06-30 NOTE — ED Notes (Signed)
Pt reports slipping and falling last night.  Laceration on anterior L thumb.  Pt unsure what she cut her hand on. Bleeding is controlled.

## 2021-06-30 NOTE — Discharge Instructions (Addendum)
6 nonabsorbable sutures were placed in your left palm in the emergency department today.  You will need to follow-up with either your PCP or urgent care in 7-10 days for removal of these.  In the interim, please leave the dressing that was placed on for 48-72 hours.  Additionally, please keep dry for this time.  After this, you may remove the dressing and rinse the wound with soap and water.  Please do not scrub the sutures, as your skin is prone to tearing and this could damage sutures.  Redress with materials given to you in the department this morning.  Manage pain with Tylenol as needed.  Monitor for signs of infection including development of fevers, yellow drainage, significant redness or warmth.  These would be reasons to return.  Return if development of new or worsening symptoms.

## 2021-07-10 DIAGNOSIS — Z5181 Encounter for therapeutic drug level monitoring: Secondary | ICD-10-CM | POA: Diagnosis not present

## 2021-07-10 DIAGNOSIS — D6869 Other thrombophilia: Secondary | ICD-10-CM | POA: Diagnosis not present

## 2021-07-10 DIAGNOSIS — I482 Chronic atrial fibrillation, unspecified: Secondary | ICD-10-CM | POA: Diagnosis not present

## 2021-07-10 DIAGNOSIS — Z7901 Long term (current) use of anticoagulants: Secondary | ICD-10-CM | POA: Diagnosis not present

## 2021-07-12 DIAGNOSIS — I129 Hypertensive chronic kidney disease with stage 1 through stage 4 chronic kidney disease, or unspecified chronic kidney disease: Secondary | ICD-10-CM | POA: Diagnosis not present

## 2021-07-12 DIAGNOSIS — E1169 Type 2 diabetes mellitus with other specified complication: Secondary | ICD-10-CM | POA: Diagnosis not present

## 2021-07-12 DIAGNOSIS — I482 Chronic atrial fibrillation, unspecified: Secondary | ICD-10-CM | POA: Diagnosis not present

## 2021-08-07 DIAGNOSIS — Z7901 Long term (current) use of anticoagulants: Secondary | ICD-10-CM | POA: Diagnosis not present

## 2021-08-07 DIAGNOSIS — I482 Chronic atrial fibrillation, unspecified: Secondary | ICD-10-CM | POA: Diagnosis not present

## 2021-08-07 DIAGNOSIS — D6869 Other thrombophilia: Secondary | ICD-10-CM | POA: Diagnosis not present

## 2021-08-07 DIAGNOSIS — Z5181 Encounter for therapeutic drug level monitoring: Secondary | ICD-10-CM | POA: Diagnosis not present

## 2021-08-08 NOTE — Progress Notes (Signed)
[facility]  [address] [place],  [place]  [address] [phone]  Clinic Day:  08/14/2021  Referring physician: Marco Collie, MD  This document serves as a record of services personally performed by Dawnell Bryant Macarthur Critchley, MD. It was created on their behalf by [facility], a trained medical scribe. The creation of this record is based on the scribe's personal observations and the provider's statements to them.  HISTORY OF PRESENT ILLNESS:  The patient is an 87 y.o. 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 She has been taking tamoxifen 20 mg daily for her 5 years of chemoprevention.  She comes in today for routine follow-up.  Since her last visit, the patient has been doing well.  She denies having any changes in her breasts which concern her for disease recurrence.    PHYSICAL EXAM:  Blood pressure (!) 104/53, pulse 80, temperature 98.3 F (36.8 C), resp. rate 16, height 5\' 3"  (1.6 m), weight 187 lb 14.4 oz (85.2 kg), SpO2 93 %. Wt Readings from Last 3 Encounters:  08/14/21 187 lb 14.4 oz (85.2 kg)  01/29/21 206 lb 1.6 oz (93.5 kg)  07/31/20 228 lb 3.2 oz (103.5 kg)   Body mass index is 33.28 kg/m. Performance status (ECOG): 1 - Symptomatic but completely ambulatory Physical Exam Constitutional:      Appearance: Normal appearance.  HENT:     Mouth/Throat:     Pharynx: Oropharynx is clear. No oropharyngeal exudate.  Cardiovascular:     Rate and Rhythm: Normal rate and regular rhythm.     Heart sounds: No murmur heard.   No friction rub. No gallop.  Pulmonary:     Breath sounds: Normal breath sounds.  Chest:  Breasts:    Right: No swelling, bleeding, inverted nipple, mass, nipple discharge or skin change.     Left: Inverted nipple (postsurgical related) present. No swelling, bleeding, mass, nipple discharge or skin change.  Abdominal:     General: Bowel sounds are normal. There is no  distension.     Palpations: Abdomen is soft. There is no mass.     Tenderness: There is no abdominal tenderness.  Musculoskeletal:        General: No tenderness.     Cervical back: Normal range of motion and neck supple.     Right lower leg: No edema.     Left lower leg: No edema.  Lymphadenopathy:     Cervical: No cervical adenopathy.     Right cervical: No superficial, deep or posterior cervical adenopathy.    Left cervical: No superficial, deep or posterior cervical adenopathy.     Upper Body:     Right upper body: No supraclavicular or axillary adenopathy.     Left upper body: No supraclavicular or axillary adenopathy.     Lower Body: No right inguinal adenopathy. No left inguinal adenopathy.  Skin:    Coloration: Skin is not jaundiced.     Findings: No lesion or rash.  Neurological:     General: No focal deficit present.     Mental Status: She is alert and oriented to person, place, and time. Mental status is at baseline.  Psychiatric:        Mood and Affect: Mood normal.        Behavior: Behavior normal.        Thought Content: Thought content normal.        Judgment: Judgment normal.   ASSESSMENT &  PLAN:  Assessment/Plan:  An 87 y.o. female with hormone positive ductal carcinoma in situ.  Based upon her clinical breast exam today, the patient remains disease-free.  She knows to continue taking tamoxifen daily for her 5 total years of chemoprevention.  With respect to her breast cancer surveillance, I will continue to follow her every 6 months with clinical breast exams.  Her annual mammogram will be scheduled before her next visit for her continued radiographic breast cancer surveillance.  The patient understands all the plans discussed today and is in agreement with them.     I, Rita Ohara, am acting as scribe for Marice Potter, MD    I have reviewed this report as typed by the medical scribe, and it is complete and accurate.  Rayen Dafoe Macarthur Critchley, MD

## 2021-08-12 DIAGNOSIS — I482 Chronic atrial fibrillation, unspecified: Secondary | ICD-10-CM | POA: Diagnosis not present

## 2021-08-12 DIAGNOSIS — E1169 Type 2 diabetes mellitus with other specified complication: Secondary | ICD-10-CM | POA: Diagnosis not present

## 2021-08-12 DIAGNOSIS — E039 Hypothyroidism, unspecified: Secondary | ICD-10-CM | POA: Diagnosis not present

## 2021-08-12 DIAGNOSIS — I129 Hypertensive chronic kidney disease with stage 1 through stage 4 chronic kidney disease, or unspecified chronic kidney disease: Secondary | ICD-10-CM | POA: Diagnosis not present

## 2021-08-14 ENCOUNTER — Other Ambulatory Visit: Payer: Self-pay | Admitting: Oncology

## 2021-08-14 ENCOUNTER — Inpatient Hospital Stay: Payer: Medicare Other | Attending: Oncology | Admitting: Oncology

## 2021-08-14 VITALS — BP 104/53 | HR 80 | Temp 98.3°F | Resp 16 | Ht 63.0 in | Wt 187.9 lb

## 2021-08-14 DIAGNOSIS — D0511 Intraductal carcinoma in situ of right breast: Secondary | ICD-10-CM | POA: Diagnosis not present

## 2021-08-14 DIAGNOSIS — D0512 Intraductal carcinoma in situ of left breast: Secondary | ICD-10-CM

## 2021-08-14 DIAGNOSIS — Z7901 Long term (current) use of anticoagulants: Secondary | ICD-10-CM | POA: Diagnosis not present

## 2021-08-14 DIAGNOSIS — E1169 Type 2 diabetes mellitus with other specified complication: Secondary | ICD-10-CM | POA: Diagnosis not present

## 2021-08-14 DIAGNOSIS — D6869 Other thrombophilia: Secondary | ICD-10-CM | POA: Diagnosis not present

## 2021-08-14 DIAGNOSIS — Z5181 Encounter for therapeutic drug level monitoring: Secondary | ICD-10-CM | POA: Diagnosis not present

## 2021-08-14 DIAGNOSIS — E782 Mixed hyperlipidemia: Secondary | ICD-10-CM | POA: Diagnosis not present

## 2021-09-07 DIAGNOSIS — E1169 Type 2 diabetes mellitus with other specified complication: Secondary | ICD-10-CM | POA: Diagnosis not present

## 2021-09-12 DIAGNOSIS — E039 Hypothyroidism, unspecified: Secondary | ICD-10-CM | POA: Diagnosis not present

## 2021-09-12 DIAGNOSIS — E1169 Type 2 diabetes mellitus with other specified complication: Secondary | ICD-10-CM | POA: Diagnosis not present

## 2021-09-12 DIAGNOSIS — I482 Chronic atrial fibrillation, unspecified: Secondary | ICD-10-CM | POA: Diagnosis not present

## 2021-09-12 DIAGNOSIS — R197 Diarrhea, unspecified: Secondary | ICD-10-CM | POA: Diagnosis not present

## 2021-09-12 DIAGNOSIS — I129 Hypertensive chronic kidney disease with stage 1 through stage 4 chronic kidney disease, or unspecified chronic kidney disease: Secondary | ICD-10-CM | POA: Diagnosis not present

## 2021-09-14 DIAGNOSIS — S20212A Contusion of left front wall of thorax, initial encounter: Secondary | ICD-10-CM | POA: Diagnosis not present

## 2021-09-14 DIAGNOSIS — Z853 Personal history of malignant neoplasm of breast: Secondary | ICD-10-CM | POA: Diagnosis not present

## 2021-09-14 DIAGNOSIS — K828 Other specified diseases of gallbladder: Secondary | ICD-10-CM | POA: Diagnosis not present

## 2021-09-14 DIAGNOSIS — Z79899 Other long term (current) drug therapy: Secondary | ICD-10-CM | POA: Diagnosis not present

## 2021-09-14 DIAGNOSIS — I2510 Atherosclerotic heart disease of native coronary artery without angina pectoris: Secondary | ICD-10-CM | POA: Diagnosis not present

## 2021-09-14 DIAGNOSIS — K5730 Diverticulosis of large intestine without perforation or abscess without bleeding: Secondary | ICD-10-CM | POA: Diagnosis not present

## 2021-09-14 DIAGNOSIS — S2249XD Multiple fractures of ribs, unspecified side, subsequent encounter for fracture with routine healing: Secondary | ICD-10-CM | POA: Diagnosis not present

## 2021-09-14 DIAGNOSIS — I10 Essential (primary) hypertension: Secondary | ICD-10-CM | POA: Diagnosis not present

## 2021-09-14 DIAGNOSIS — E785 Hyperlipidemia, unspecified: Secondary | ICD-10-CM | POA: Diagnosis not present

## 2021-09-14 DIAGNOSIS — Z87891 Personal history of nicotine dependence: Secondary | ICD-10-CM | POA: Diagnosis not present

## 2021-09-14 DIAGNOSIS — K8042 Calculus of bile duct with acute cholecystitis without obstruction: Secondary | ICD-10-CM | POA: Diagnosis not present

## 2021-09-14 DIAGNOSIS — N1831 Chronic kidney disease, stage 3a: Secondary | ICD-10-CM | POA: Diagnosis not present

## 2021-09-14 DIAGNOSIS — K8010 Calculus of gallbladder with chronic cholecystitis without obstruction: Secondary | ICD-10-CM | POA: Diagnosis not present

## 2021-09-14 DIAGNOSIS — K8020 Calculus of gallbladder without cholecystitis without obstruction: Secondary | ICD-10-CM | POA: Diagnosis not present

## 2021-09-14 DIAGNOSIS — R109 Unspecified abdominal pain: Secondary | ICD-10-CM | POA: Diagnosis not present

## 2021-09-14 DIAGNOSIS — Z7901 Long term (current) use of anticoagulants: Secondary | ICD-10-CM | POA: Diagnosis not present

## 2021-09-14 DIAGNOSIS — E860 Dehydration: Secondary | ICD-10-CM | POA: Diagnosis not present

## 2021-09-14 DIAGNOSIS — R1011 Right upper quadrant pain: Secondary | ICD-10-CM | POA: Diagnosis not present

## 2021-09-14 DIAGNOSIS — K8080 Other cholelithiasis without obstruction: Secondary | ICD-10-CM | POA: Diagnosis not present

## 2021-09-14 DIAGNOSIS — I110 Hypertensive heart disease with heart failure: Secondary | ICD-10-CM | POA: Diagnosis not present

## 2021-09-14 DIAGNOSIS — Z743 Need for continuous supervision: Secondary | ICD-10-CM | POA: Diagnosis not present

## 2021-09-14 DIAGNOSIS — R1013 Epigastric pain: Secondary | ICD-10-CM | POA: Diagnosis not present

## 2021-09-14 DIAGNOSIS — M109 Gout, unspecified: Secondary | ICD-10-CM | POA: Diagnosis not present

## 2021-09-14 DIAGNOSIS — Z792 Long term (current) use of antibiotics: Secondary | ICD-10-CM | POA: Diagnosis not present

## 2021-09-14 DIAGNOSIS — E871 Hypo-osmolality and hyponatremia: Secondary | ICD-10-CM | POA: Diagnosis not present

## 2021-09-14 DIAGNOSIS — K8050 Calculus of bile duct without cholangitis or cholecystitis without obstruction: Secondary | ICD-10-CM | POA: Diagnosis not present

## 2021-09-14 DIAGNOSIS — W19XXXA Unspecified fall, initial encounter: Secondary | ICD-10-CM | POA: Diagnosis not present

## 2021-09-14 DIAGNOSIS — K8046 Calculus of bile duct with acute and chronic cholecystitis without obstruction: Secondary | ICD-10-CM | POA: Diagnosis not present

## 2021-09-14 DIAGNOSIS — K819 Cholecystitis, unspecified: Secondary | ICD-10-CM | POA: Diagnosis not present

## 2021-09-14 DIAGNOSIS — Z20822 Contact with and (suspected) exposure to covid-19: Secondary | ICD-10-CM | POA: Diagnosis not present

## 2021-09-14 DIAGNOSIS — K838 Other specified diseases of biliary tract: Secondary | ICD-10-CM | POA: Diagnosis not present

## 2021-09-14 DIAGNOSIS — K449 Diaphragmatic hernia without obstruction or gangrene: Secondary | ICD-10-CM | POA: Diagnosis not present

## 2021-09-14 DIAGNOSIS — D6869 Other thrombophilia: Secondary | ICD-10-CM | POA: Diagnosis not present

## 2021-09-14 DIAGNOSIS — I509 Heart failure, unspecified: Secondary | ICD-10-CM | POA: Diagnosis not present

## 2021-09-14 DIAGNOSIS — E039 Hypothyroidism, unspecified: Secondary | ICD-10-CM | POA: Diagnosis not present

## 2021-09-14 DIAGNOSIS — S2242XA Multiple fractures of ribs, left side, initial encounter for closed fracture: Secondary | ICD-10-CM | POA: Diagnosis not present

## 2021-09-14 DIAGNOSIS — Z9889 Other specified postprocedural states: Secondary | ICD-10-CM | POA: Diagnosis not present

## 2021-09-14 DIAGNOSIS — I482 Chronic atrial fibrillation, unspecified: Secondary | ICD-10-CM | POA: Diagnosis not present

## 2021-09-14 DIAGNOSIS — Z7401 Bed confinement status: Secondary | ICD-10-CM | POA: Diagnosis not present

## 2021-09-14 DIAGNOSIS — R42 Dizziness and giddiness: Secondary | ICD-10-CM | POA: Diagnosis not present

## 2021-09-14 DIAGNOSIS — R531 Weakness: Secondary | ICD-10-CM | POA: Diagnosis not present

## 2021-09-14 DIAGNOSIS — R945 Abnormal results of liver function studies: Secondary | ICD-10-CM | POA: Diagnosis not present

## 2021-09-14 DIAGNOSIS — R9389 Abnormal findings on diagnostic imaging of other specified body structures: Secondary | ICD-10-CM | POA: Diagnosis not present

## 2021-09-14 DIAGNOSIS — D689 Coagulation defect, unspecified: Secondary | ICD-10-CM | POA: Diagnosis not present

## 2021-09-14 DIAGNOSIS — S2232XA Fracture of one rib, left side, initial encounter for closed fracture: Secondary | ICD-10-CM | POA: Diagnosis not present

## 2021-09-14 DIAGNOSIS — I4891 Unspecified atrial fibrillation: Secondary | ICD-10-CM | POA: Diagnosis not present

## 2021-09-14 DIAGNOSIS — W19XXXD Unspecified fall, subsequent encounter: Secondary | ICD-10-CM | POA: Diagnosis not present

## 2021-09-23 DIAGNOSIS — I4891 Unspecified atrial fibrillation: Secondary | ICD-10-CM | POA: Diagnosis not present

## 2021-09-23 DIAGNOSIS — R42 Dizziness and giddiness: Secondary | ICD-10-CM | POA: Diagnosis not present

## 2021-09-23 DIAGNOSIS — E039 Hypothyroidism, unspecified: Secondary | ICD-10-CM | POA: Diagnosis not present

## 2021-09-23 DIAGNOSIS — Z7901 Long term (current) use of anticoagulants: Secondary | ICD-10-CM | POA: Diagnosis not present

## 2021-09-23 DIAGNOSIS — W19XXXA Unspecified fall, initial encounter: Secondary | ICD-10-CM | POA: Diagnosis not present

## 2021-09-23 DIAGNOSIS — I2510 Atherosclerotic heart disease of native coronary artery without angina pectoris: Secondary | ICD-10-CM | POA: Diagnosis not present

## 2021-09-23 DIAGNOSIS — K449 Diaphragmatic hernia without obstruction or gangrene: Secondary | ICD-10-CM | POA: Diagnosis not present

## 2021-09-23 DIAGNOSIS — Z79899 Other long term (current) drug therapy: Secondary | ICD-10-CM | POA: Diagnosis not present

## 2021-09-23 DIAGNOSIS — K819 Cholecystitis, unspecified: Secondary | ICD-10-CM | POA: Diagnosis not present

## 2021-09-23 DIAGNOSIS — S2249XD Multiple fractures of ribs, unspecified side, subsequent encounter for fracture with routine healing: Secondary | ICD-10-CM | POA: Diagnosis not present

## 2021-09-23 DIAGNOSIS — I10 Essential (primary) hypertension: Secondary | ICD-10-CM | POA: Diagnosis not present

## 2021-09-23 DIAGNOSIS — Z743 Need for continuous supervision: Secondary | ICD-10-CM | POA: Diagnosis not present

## 2021-09-23 DIAGNOSIS — Z7401 Bed confinement status: Secondary | ICD-10-CM | POA: Diagnosis not present

## 2021-09-23 DIAGNOSIS — R9389 Abnormal findings on diagnostic imaging of other specified body structures: Secondary | ICD-10-CM | POA: Diagnosis not present

## 2021-09-23 DIAGNOSIS — I129 Hypertensive chronic kidney disease with stage 1 through stage 4 chronic kidney disease, or unspecified chronic kidney disease: Secondary | ICD-10-CM | POA: Diagnosis not present

## 2021-09-23 DIAGNOSIS — D689 Coagulation defect, unspecified: Secondary | ICD-10-CM | POA: Diagnosis not present

## 2021-09-23 DIAGNOSIS — Z853 Personal history of malignant neoplasm of breast: Secondary | ICD-10-CM | POA: Diagnosis not present

## 2021-09-23 DIAGNOSIS — K219 Gastro-esophageal reflux disease without esophagitis: Secondary | ICD-10-CM | POA: Diagnosis not present

## 2021-09-23 DIAGNOSIS — S2232XA Fracture of one rib, left side, initial encounter for closed fracture: Secondary | ICD-10-CM | POA: Diagnosis not present

## 2021-09-23 DIAGNOSIS — I509 Heart failure, unspecified: Secondary | ICD-10-CM | POA: Diagnosis not present

## 2021-09-23 DIAGNOSIS — R109 Unspecified abdominal pain: Secondary | ICD-10-CM | POA: Diagnosis not present

## 2021-09-23 DIAGNOSIS — E785 Hyperlipidemia, unspecified: Secondary | ICD-10-CM | POA: Diagnosis not present

## 2021-09-23 DIAGNOSIS — I482 Chronic atrial fibrillation, unspecified: Secondary | ICD-10-CM | POA: Diagnosis not present

## 2021-09-23 DIAGNOSIS — E1169 Type 2 diabetes mellitus with other specified complication: Secondary | ICD-10-CM | POA: Diagnosis not present

## 2021-09-23 DIAGNOSIS — M109 Gout, unspecified: Secondary | ICD-10-CM | POA: Diagnosis not present

## 2021-09-23 DIAGNOSIS — E860 Dehydration: Secondary | ICD-10-CM | POA: Diagnosis not present

## 2021-09-23 DIAGNOSIS — D6869 Other thrombophilia: Secondary | ICD-10-CM | POA: Diagnosis not present

## 2021-09-23 DIAGNOSIS — W19XXXD Unspecified fall, subsequent encounter: Secondary | ICD-10-CM | POA: Diagnosis not present

## 2021-09-23 DIAGNOSIS — K8042 Calculus of bile duct with acute cholecystitis without obstruction: Secondary | ICD-10-CM | POA: Diagnosis not present

## 2021-09-23 DIAGNOSIS — K8050 Calculus of bile duct without cholangitis or cholecystitis without obstruction: Secondary | ICD-10-CM | POA: Diagnosis not present

## 2021-09-23 DIAGNOSIS — I480 Paroxysmal atrial fibrillation: Secondary | ICD-10-CM | POA: Diagnosis not present

## 2021-09-23 DIAGNOSIS — S2242XA Multiple fractures of ribs, left side, initial encounter for closed fracture: Secondary | ICD-10-CM | POA: Diagnosis not present

## 2021-09-25 DIAGNOSIS — M109 Gout, unspecified: Secondary | ICD-10-CM | POA: Diagnosis not present

## 2021-09-25 DIAGNOSIS — Z7901 Long term (current) use of anticoagulants: Secondary | ICD-10-CM | POA: Diagnosis not present

## 2021-09-25 DIAGNOSIS — K219 Gastro-esophageal reflux disease without esophagitis: Secondary | ICD-10-CM | POA: Diagnosis not present

## 2021-09-25 DIAGNOSIS — I10 Essential (primary) hypertension: Secondary | ICD-10-CM | POA: Diagnosis not present

## 2021-09-25 DIAGNOSIS — R9389 Abnormal findings on diagnostic imaging of other specified body structures: Secondary | ICD-10-CM | POA: Diagnosis not present

## 2021-09-25 DIAGNOSIS — E785 Hyperlipidemia, unspecified: Secondary | ICD-10-CM | POA: Diagnosis not present

## 2021-09-25 DIAGNOSIS — Z853 Personal history of malignant neoplasm of breast: Secondary | ICD-10-CM | POA: Diagnosis not present

## 2021-09-25 DIAGNOSIS — E039 Hypothyroidism, unspecified: Secondary | ICD-10-CM | POA: Diagnosis not present

## 2021-10-10 DIAGNOSIS — I129 Hypertensive chronic kidney disease with stage 1 through stage 4 chronic kidney disease, or unspecified chronic kidney disease: Secondary | ICD-10-CM | POA: Diagnosis not present

## 2021-10-10 DIAGNOSIS — E1169 Type 2 diabetes mellitus with other specified complication: Secondary | ICD-10-CM | POA: Diagnosis not present

## 2021-10-10 DIAGNOSIS — I482 Chronic atrial fibrillation, unspecified: Secondary | ICD-10-CM | POA: Diagnosis not present

## 2021-10-10 DIAGNOSIS — I480 Paroxysmal atrial fibrillation: Secondary | ICD-10-CM | POA: Diagnosis not present

## 2021-10-10 DIAGNOSIS — E039 Hypothyroidism, unspecified: Secondary | ICD-10-CM | POA: Diagnosis not present

## 2021-10-12 ENCOUNTER — Other Ambulatory Visit: Payer: Self-pay | Admitting: Hematology and Oncology

## 2021-10-22 DIAGNOSIS — I10 Essential (primary) hypertension: Secondary | ICD-10-CM | POA: Diagnosis not present

## 2021-10-22 DIAGNOSIS — Z7901 Long term (current) use of anticoagulants: Secondary | ICD-10-CM | POA: Diagnosis not present

## 2021-10-22 DIAGNOSIS — Z853 Personal history of malignant neoplasm of breast: Secondary | ICD-10-CM | POA: Diagnosis not present

## 2021-10-22 DIAGNOSIS — R42 Dizziness and giddiness: Secondary | ICD-10-CM | POA: Diagnosis not present

## 2021-10-22 DIAGNOSIS — M6281 Muscle weakness (generalized): Secondary | ICD-10-CM | POA: Diagnosis not present

## 2021-10-22 DIAGNOSIS — K819 Cholecystitis, unspecified: Secondary | ICD-10-CM | POA: Diagnosis not present

## 2021-10-22 DIAGNOSIS — I482 Chronic atrial fibrillation, unspecified: Secondary | ICD-10-CM | POA: Diagnosis not present

## 2021-10-23 DIAGNOSIS — Z853 Personal history of malignant neoplasm of breast: Secondary | ICD-10-CM | POA: Diagnosis not present

## 2021-10-23 DIAGNOSIS — M6281 Muscle weakness (generalized): Secondary | ICD-10-CM | POA: Diagnosis not present

## 2021-10-23 DIAGNOSIS — I482 Chronic atrial fibrillation, unspecified: Secondary | ICD-10-CM | POA: Diagnosis not present

## 2021-10-23 DIAGNOSIS — R42 Dizziness and giddiness: Secondary | ICD-10-CM | POA: Diagnosis not present

## 2021-10-23 DIAGNOSIS — I10 Essential (primary) hypertension: Secondary | ICD-10-CM | POA: Diagnosis not present

## 2021-10-23 DIAGNOSIS — K819 Cholecystitis, unspecified: Secondary | ICD-10-CM | POA: Diagnosis not present

## 2021-10-23 DIAGNOSIS — Z7901 Long term (current) use of anticoagulants: Secondary | ICD-10-CM | POA: Diagnosis not present

## 2021-10-24 DIAGNOSIS — I4821 Permanent atrial fibrillation: Secondary | ICD-10-CM | POA: Diagnosis not present

## 2021-10-24 DIAGNOSIS — E782 Mixed hyperlipidemia: Secondary | ICD-10-CM | POA: Diagnosis not present

## 2021-10-24 DIAGNOSIS — R770 Abnormality of albumin: Secondary | ICD-10-CM | POA: Diagnosis not present

## 2021-10-24 DIAGNOSIS — R779 Abnormality of plasma protein, unspecified: Secondary | ICD-10-CM | POA: Diagnosis not present

## 2021-10-24 DIAGNOSIS — H814 Vertigo of central origin: Secondary | ICD-10-CM | POA: Diagnosis not present

## 2021-10-24 DIAGNOSIS — K219 Gastro-esophageal reflux disease without esophagitis: Secondary | ICD-10-CM | POA: Diagnosis not present

## 2021-10-24 DIAGNOSIS — M10472 Other secondary gout, left ankle and foot: Secondary | ICD-10-CM | POA: Diagnosis not present

## 2021-10-24 DIAGNOSIS — Z7901 Long term (current) use of anticoagulants: Secondary | ICD-10-CM | POA: Diagnosis not present

## 2021-10-24 DIAGNOSIS — M150 Primary generalized (osteo)arthritis: Secondary | ICD-10-CM | POA: Diagnosis not present

## 2021-10-24 DIAGNOSIS — I5033 Acute on chronic diastolic (congestive) heart failure: Secondary | ICD-10-CM | POA: Diagnosis not present

## 2021-10-24 DIAGNOSIS — E038 Other specified hypothyroidism: Secondary | ICD-10-CM | POA: Diagnosis not present

## 2021-10-24 DIAGNOSIS — Z79899 Other long term (current) drug therapy: Secondary | ICD-10-CM | POA: Diagnosis not present

## 2021-10-31 ENCOUNTER — Emergency Department (HOSPITAL_COMMUNITY): Payer: Medicare Other

## 2021-10-31 ENCOUNTER — Encounter (HOSPITAL_COMMUNITY): Payer: Self-pay | Admitting: Internal Medicine

## 2021-10-31 ENCOUNTER — Inpatient Hospital Stay (HOSPITAL_COMMUNITY)
Admission: EM | Admit: 2021-10-31 | Discharge: 2021-11-09 | DRG: 309 | Disposition: A | Discharge status: Skilled Nursing Facility | Payer: Medicare Other | Source: Skilled Nursing Facility | Attending: Internal Medicine | Admitting: Internal Medicine

## 2021-10-31 DIAGNOSIS — I824Y3 Acute embolism and thrombosis of unspecified deep veins of proximal lower extremity, bilateral: Secondary | ICD-10-CM | POA: Diagnosis not present

## 2021-10-31 DIAGNOSIS — I82452 Acute embolism and thrombosis of left peroneal vein: Secondary | ICD-10-CM | POA: Diagnosis not present

## 2021-10-31 DIAGNOSIS — M79605 Pain in left leg: Secondary | ICD-10-CM

## 2021-10-31 DIAGNOSIS — I4891 Unspecified atrial fibrillation: Principal | ICD-10-CM

## 2021-10-31 DIAGNOSIS — I272 Pulmonary hypertension, unspecified: Secondary | ICD-10-CM | POA: Diagnosis not present

## 2021-10-31 DIAGNOSIS — Z23 Encounter for immunization: Secondary | ICD-10-CM

## 2021-10-31 DIAGNOSIS — R7881 Bacteremia: Secondary | ICD-10-CM | POA: Diagnosis present

## 2021-10-31 DIAGNOSIS — Z743 Need for continuous supervision: Secondary | ICD-10-CM | POA: Diagnosis not present

## 2021-10-31 DIAGNOSIS — Z823 Family history of stroke: Secondary | ICD-10-CM

## 2021-10-31 DIAGNOSIS — Z853 Personal history of malignant neoplasm of breast: Secondary | ICD-10-CM

## 2021-10-31 DIAGNOSIS — E876 Hypokalemia: Secondary | ICD-10-CM | POA: Diagnosis present

## 2021-10-31 DIAGNOSIS — Z87891 Personal history of nicotine dependence: Secondary | ICD-10-CM

## 2021-10-31 DIAGNOSIS — E872 Acidosis, unspecified: Secondary | ICD-10-CM | POA: Diagnosis not present

## 2021-10-31 DIAGNOSIS — E785 Hyperlipidemia, unspecified: Secondary | ICD-10-CM | POA: Diagnosis present

## 2021-10-31 DIAGNOSIS — R531 Weakness: Secondary | ICD-10-CM | POA: Diagnosis not present

## 2021-10-31 DIAGNOSIS — E039 Hypothyroidism, unspecified: Secondary | ICD-10-CM | POA: Diagnosis present

## 2021-10-31 DIAGNOSIS — Z79899 Other long term (current) drug therapy: Secondary | ICD-10-CM | POA: Diagnosis not present

## 2021-10-31 DIAGNOSIS — Z79890 Hormone replacement therapy (postmenopausal): Secondary | ICD-10-CM | POA: Diagnosis not present

## 2021-10-31 DIAGNOSIS — S2232XA Fracture of one rib, left side, initial encounter for closed fracture: Secondary | ICD-10-CM | POA: Diagnosis not present

## 2021-10-31 DIAGNOSIS — I10 Essential (primary) hypertension: Secondary | ICD-10-CM

## 2021-10-31 DIAGNOSIS — I82433 Acute embolism and thrombosis of popliteal vein, bilateral: Secondary | ICD-10-CM | POA: Diagnosis not present

## 2021-10-31 DIAGNOSIS — Z7901 Long term (current) use of anticoagulants: Secondary | ICD-10-CM | POA: Diagnosis not present

## 2021-10-31 DIAGNOSIS — B9689 Other specified bacterial agents as the cause of diseases classified elsewhere: Secondary | ICD-10-CM | POA: Diagnosis present

## 2021-10-31 DIAGNOSIS — K219 Gastro-esophageal reflux disease without esophagitis: Secondary | ICD-10-CM | POA: Diagnosis present

## 2021-10-31 DIAGNOSIS — R55 Syncope and collapse: Secondary | ICD-10-CM | POA: Diagnosis not present

## 2021-10-31 DIAGNOSIS — R000 Tachycardia, unspecified: Secondary | ICD-10-CM | POA: Diagnosis not present

## 2021-10-31 DIAGNOSIS — I499 Cardiac arrhythmia, unspecified: Secondary | ICD-10-CM | POA: Diagnosis not present

## 2021-10-31 DIAGNOSIS — E119 Type 2 diabetes mellitus without complications: Secondary | ICD-10-CM | POA: Diagnosis not present

## 2021-10-31 DIAGNOSIS — I82412 Acute embolism and thrombosis of left femoral vein: Secondary | ICD-10-CM | POA: Diagnosis present

## 2021-10-31 DIAGNOSIS — Z79810 Long term (current) use of selective estrogen receptor modulators (SERMs): Secondary | ICD-10-CM

## 2021-10-31 DIAGNOSIS — I824Y9 Acute embolism and thrombosis of unspecified deep veins of unspecified proximal lower extremity: Secondary | ICD-10-CM | POA: Diagnosis not present

## 2021-10-31 DIAGNOSIS — Z8249 Family history of ischemic heart disease and other diseases of the circulatory system: Secondary | ICD-10-CM

## 2021-10-31 DIAGNOSIS — I82442 Acute embolism and thrombosis of left tibial vein: Secondary | ICD-10-CM | POA: Diagnosis not present

## 2021-10-31 DIAGNOSIS — I959 Hypotension, unspecified: Secondary | ICD-10-CM | POA: Diagnosis present

## 2021-10-31 DIAGNOSIS — M79604 Pain in right leg: Secondary | ICD-10-CM | POA: Diagnosis not present

## 2021-10-31 DIAGNOSIS — I4821 Permanent atrial fibrillation: Secondary | ICD-10-CM | POA: Diagnosis not present

## 2021-10-31 DIAGNOSIS — E46 Unspecified protein-calorie malnutrition: Secondary | ICD-10-CM | POA: Diagnosis not present

## 2021-10-31 DIAGNOSIS — Z7401 Bed confinement status: Secondary | ICD-10-CM | POA: Diagnosis not present

## 2021-10-31 DIAGNOSIS — Z20822 Contact with and (suspected) exposure to covid-19: Secondary | ICD-10-CM | POA: Diagnosis not present

## 2021-10-31 DIAGNOSIS — R6889 Other general symptoms and signs: Secondary | ICD-10-CM | POA: Diagnosis not present

## 2021-10-31 DIAGNOSIS — Z9049 Acquired absence of other specified parts of digestive tract: Secondary | ICD-10-CM | POA: Diagnosis not present

## 2021-10-31 DIAGNOSIS — R42 Dizziness and giddiness: Secondary | ICD-10-CM | POA: Diagnosis not present

## 2021-10-31 DIAGNOSIS — R5381 Other malaise: Secondary | ICD-10-CM | POA: Diagnosis present

## 2021-10-31 DIAGNOSIS — R791 Abnormal coagulation profile: Secondary | ICD-10-CM | POA: Diagnosis present

## 2021-10-31 HISTORY — DX: Unspecified atrial fibrillation: I48.91

## 2021-10-31 LAB — BASIC METABOLIC PANEL
Anion gap: 14 (ref 5–15)
BUN: 32 mg/dL — ABNORMAL HIGH (ref 8–23)
CO2: 22 mmol/L (ref 22–32)
Calcium: 8.9 mg/dL (ref 8.9–10.3)
Chloride: 101 mmol/L (ref 98–111)
Creatinine, Ser: 0.99 mg/dL (ref 0.44–1.00)
GFR, Estimated: 55 mL/min — ABNORMAL LOW (ref >60)
Glucose, Bld: 127 mg/dL — ABNORMAL HIGH (ref 70–99)
Potassium: 3.2 mmol/L — ABNORMAL LOW (ref 3.5–5.1)
Sodium: 137 mmol/L (ref 135–145)

## 2021-10-31 LAB — URINALYSIS, ROUTINE W REFLEX MICROSCOPIC
Bilirubin Urine: NEGATIVE
Glucose, UA: NEGATIVE mg/dL
Hgb urine dipstick: NEGATIVE
Ketones, ur: NEGATIVE mg/dL
Leukocytes,Ua: NEGATIVE
Nitrite: NEGATIVE
Protein, ur: NEGATIVE mg/dL
Specific Gravity, Urine: 1.017 (ref 1.005–1.030)
pH: 5 (ref 5.0–8.0)

## 2021-10-31 LAB — CBC WITH DIFFERENTIAL/PLATELET
Abs Immature Granulocytes: 0.04 10*3/uL (ref 0.00–0.07)
Basophils Absolute: 0 10*3/uL (ref 0.0–0.1)
Basophils Relative: 1 %
Eosinophils Absolute: 0.1 10*3/uL (ref 0.0–0.5)
Eosinophils Relative: 1 %
HCT: 39.9 % (ref 36.0–46.0)
Hemoglobin: 13.5 g/dL (ref 12.0–15.0)
Immature Granulocytes: 1 %
Lymphocytes Relative: 13 %
Lymphs Abs: 1.1 10*3/uL (ref 0.7–4.0)
MCH: 35.2 pg — ABNORMAL HIGH (ref 26.0–34.0)
MCHC: 33.8 g/dL (ref 30.0–36.0)
MCV: 103.9 fL — ABNORMAL HIGH (ref 80.0–100.0)
Monocytes Absolute: 0.7 10*3/uL (ref 0.1–1.0)
Monocytes Relative: 8 %
Neutro Abs: 6.4 10*3/uL (ref 1.7–7.7)
Neutrophils Relative %: 76 %
Platelets: 197 10*3/uL (ref 150–400)
RBC: 3.84 MIL/uL — ABNORMAL LOW (ref 3.87–5.11)
RDW: 13.9 % (ref 11.5–15.5)
WBC: 8.4 10*3/uL (ref 4.0–10.5)
nRBC: 0 % (ref 0.0–0.2)

## 2021-10-31 LAB — MAGNESIUM: Magnesium: 1.5 mg/dL — ABNORMAL LOW (ref 1.7–2.4)

## 2021-10-31 LAB — GLUCOSE, CAPILLARY: Glucose-Capillary: 114 mg/dL — ABNORMAL HIGH (ref 70–99)

## 2021-10-31 LAB — LACTIC ACID, PLASMA
Lactic Acid, Venous: 2.2 mmol/L (ref 0.5–1.9)
Lactic Acid, Venous: 2.4 mmol/L (ref 0.5–1.9)

## 2021-10-31 LAB — PROTIME-INR
INR: 1.6 — ABNORMAL HIGH (ref 0.8–1.2)
Prothrombin Time: 18.7 seconds — ABNORMAL HIGH (ref 11.4–15.2)

## 2021-10-31 MED ORDER — SODIUM CHLORIDE 0.9 % IV SOLN: INTRAVENOUS | Status: AC

## 2021-10-31 MED ORDER — SIMVASTATIN 20 MG PO TABS
20.0000 mg | ORAL_TABLET | Freq: Every day | ORAL | Status: DC
  Administered 2021-10-31 – 2021-11-01 (×2): 20 mg via ORAL
  Filled 2021-10-31 (×2): qty 1

## 2021-10-31 MED ORDER — ONDANSETRON HCL 4 MG/2ML IJ SOLN: 4.0000 mg | Freq: Four times a day (QID) | INTRAMUSCULAR | Status: DC | PRN

## 2021-10-31 MED ORDER — SODIUM CHLORIDE 0.9 % IV BOLUS
500.0000 mL | Freq: Once | INTRAVENOUS | Status: AC
  Administered 2021-10-31: 500 mL via INTRAVENOUS

## 2021-10-31 MED ORDER — AMIODARONE HCL IN DEXTROSE 360-4.14 MG/200ML-% IV SOLN
60.0000 mg/h | INTRAVENOUS | Status: AC
Start: 2021-10-31 — End: 2021-11-01
  Administered 2021-10-31: 60 mg/h via INTRAVENOUS

## 2021-10-31 MED ORDER — LEVOTHYROXINE SODIUM 75 MCG PO TABS
75.0000 ug | ORAL_TABLET | Freq: Every day | ORAL | Status: DC
  Administered 2021-11-01 – 2021-11-09 (×9): 75 ug via ORAL
  Filled 2021-10-31 (×9): qty 1

## 2021-10-31 MED ORDER — PANTOPRAZOLE SODIUM 40 MG PO TBEC
40.0000 mg | DELAYED_RELEASE_TABLET | Freq: Every day | ORAL | Status: DC
Start: 2021-11-01 — End: 2021-11-10
  Administered 2021-11-01 – 2021-11-09 (×9): 40 mg via ORAL
  Filled 2021-10-31 (×9): qty 1

## 2021-10-31 MED ORDER — AMIODARONE LOAD VIA INFUSION
150.0000 mg | Freq: Once | INTRAVENOUS | Status: AC
  Administered 2021-10-31: 150 mg via INTRAVENOUS
  Filled 2021-10-31: qty 83.34

## 2021-10-31 MED ORDER — INSULIN ASPART 100 UNIT/ML IJ SOLN
0.0000 [IU] | Freq: Three times a day (TID) | INTRAMUSCULAR | Status: DC
  Administered 2021-11-01: 2 [IU] via SUBCUTANEOUS
  Administered 2021-11-02 – 2021-11-09 (×6): 1 [IU] via SUBCUTANEOUS

## 2021-10-31 MED ORDER — ACETAMINOPHEN 325 MG PO TABS
650.0000 mg | ORAL_TABLET | ORAL | Status: DC | PRN
  Administered 2021-11-03: 650 mg via ORAL

## 2021-10-31 MED ORDER — POTASSIUM CHLORIDE 20 MEQ PO PACK
40.0000 meq | PACK | Freq: Two times a day (BID) | ORAL | Status: DC
  Administered 2021-10-31 – 2021-11-01 (×3): 40 meq via ORAL
  Filled 2021-10-31 (×3): qty 2

## 2021-10-31 MED ORDER — DILTIAZEM LOAD VIA INFUSION
15.0000 mg | Freq: Once | INTRAVENOUS | Status: AC
Start: 2021-10-31 — End: 2021-10-31
  Administered 2021-10-31: 15 mg via INTRAVENOUS
  Filled 2021-10-31: qty 15

## 2021-10-31 MED ORDER — WARFARIN SODIUM 4 MG PO TABS
4.0000 mg | ORAL_TABLET | Freq: Once | ORAL | Status: AC
  Administered 2021-10-31: 4 mg via ORAL
  Filled 2021-10-31 (×2): qty 1

## 2021-10-31 MED ORDER — WARFARIN - PHARMACIST DOSING INPATIENT: Freq: Every day | Status: DC

## 2021-10-31 MED ORDER — DILTIAZEM HCL-DEXTROSE 125-5 MG/125ML-% IV SOLN (PREMIX)
5.0000 mg/h | INTRAVENOUS | Status: DC
  Administered 2021-10-31: 5 mg/h via INTRAVENOUS
  Filled 2021-10-31: qty 125

## 2021-10-31 MED ORDER — FUROSEMIDE 20 MG PO TABS: 20.0000 mg | ORAL_TABLET | Freq: Every day | ORAL | Status: DC

## 2021-10-31 MED ORDER — ALLOPURINOL 100 MG PO TABS
200.0000 mg | ORAL_TABLET | Freq: Every day | ORAL | Status: DC
  Administered 2021-11-01 – 2021-11-09 (×9): 200 mg via ORAL
  Filled 2021-10-31 (×9): qty 2

## 2021-10-31 MED ORDER — AMIODARONE HCL IN DEXTROSE 360-4.14 MG/200ML-% IV SOLN
30.0000 mg/h | INTRAVENOUS | Status: DC
Start: 2021-11-01 — End: 2021-11-04
  Administered 2021-11-01 – 2021-11-03 (×6): 30 mg/h via INTRAVENOUS
  Filled 2021-10-31 (×8): qty 200

## 2021-10-31 MED ORDER — CALCIUM CARBONATE 1250 (500 CA) MG PO TABS
1250.0000 mg | ORAL_TABLET | Freq: Every day | ORAL | Status: DC
  Administered 2021-11-01 – 2021-11-09 (×9): 1250 mg via ORAL
  Filled 2021-10-31 (×9): qty 1

## 2021-10-31 MED ORDER — POTASSIUM CHLORIDE CRYS ER 20 MEQ PO TBCR
30.0000 meq | EXTENDED_RELEASE_TABLET | Freq: Once | ORAL | Status: AC
  Administered 2021-10-31: 30 meq via ORAL
  Filled 2021-10-31: qty 1

## 2021-10-31 NOTE — H&P (Addendum)
?History and Physical  ? ?[name] STM:[id number] DOB: 1930/02/25 DOA: 10/31/2021 ? ?PCP: Marco Collie, MD  ? ?Patient coming from: [facility] ? ?Chief Complaint: Tachycardia ? ?HPI: Shalondra Wunschel is a 88 y.o. female with medical history significant of breast cancer status postlumpectomy, hypertension, CKD, atrial fibrillation, hypothyroidism, diabetes presenting with tachycardia. ? ?Patient feeling unwell for the past week with general weakness and fatigue.  Was seen by medical staff today for urinalysis and was dizzy and disoriented upon standing.  EKG was performed which showed atrial fibrillation with RVR.  EMS was called and patient was noted to initially be tachycardic to the 200s with hypotension.  This improved in route.  Patient was alert and oriented at the time and on arrival to the ED.  Patient did notably have recent cholecystectomy about a month ago and has been on antibiotics for infection recently per family. ? ?Family also states patient has been referred to cardiology for evaluation of switching anticoagulant.  There is also plan for an echocardiogram per family report. ? ?Patient denies fevers, chills, chest pain, shortness of breath, abdominal pain, constipation, diarrhea, nausea, vomiting. ? ?ED Course: Vital signs in the ED significant for blood pressure in the 892J to 194R systolic and pulse in the 60s to 110s.  Patient did have transient hypotension after receiving diltiazem.  Lab work-up included BMP with potassium 3.2, BUN 32, glucose 127.  CBC within normal limits.  PT and INR elevated at 18.7 and 1.6 respectively.  Mild subtherapeutic for patient on warfarin.  Lactic acid mildly elevated at 2.2 and 2.4 on repeat.  Urinalysis without acute abnormality and blood cultures pending.  Chest x-ray showed cardiomegaly without pulmonary edema nor consolidation.  Linear densities were noted suggesting atelectasis.  Patient trialed on diltiazem bolus and drip however became  transiently hypotensive after bolus.  Received 500 cc of fluid in the ED as well as 30 mEq potassium. ? ?Review of Systems: As per HPI otherwise all other systems reviewed and are negative. ? ?Past Medical History:  ?Diagnosis Date  ? Atrial fibrillation ([place])   ? ? ?Past Surgical History:  ?Procedure Laterality Date  ? CHOLECYSTECTOMY N/A 2022  ? ? ?Social History ? reports that she has quit smoking. She has never used smokeless tobacco. No history on file for alcohol use and drug use. ? ?No Known Allergies ? ?Family History  ?Problem Relation Age of Onset  ? Stroke Mother   ? Heart disease Father   ?Reviewed on admission ? ?Prior to Admission medications   ?Medication Sig Start Date End Date Taking? Authorizing Provider  ?acetaminophen (TYLENOL) 325 MG tablet Take 650 mg by mouth every 6 (six) hours as needed for mild pain or fever.   Yes [provider]  ?allopurinol (ZYLOPRIM) 100 MG tablet Take 200 mg by mouth daily.   Yes [provider]  ?calcium carbonate (OS-CAL) 1250 (500 Ca) MG chewable tablet Chew 1,250 mg by mouth daily.   Yes [provider]  ?CARTIA XT 240 MG 24 hr capsule Take 240 mg by mouth daily as needed (HR >110). 05/19/20  Yes [provider]  ?furosemide (LASIX) 20 MG tablet Take 20 mg by mouth daily.   Yes [provider]  ?Infant Care Products (DERMACLOUD EX) Apply 1 application. topically in the morning, at noon, and at bedtime. To buttocks   Yes [provider]  ?levothyroxine (SYNTHROID) 75 MCG tablet Take 75 mcg by mouth daily.   Yes [provider]  ?  meclizine (ANTIVERT) 12.5 MG tablet Take 12.5 mg by mouth 3 (three) times daily as needed for dizziness.   Yes [provider]  ?omeprazole (PRILOSEC) 20 MG capsule Take 20 mg by mouth daily.   Yes [provider]  ?Pollen Extracts (PROSTAT PO) Take 30 mLs by mouth in the morning and at bedtime.   Yes [provider]  ?potassium chloride (KLOR-CON M) 10  MEQ tablet Take 10 mEq by mouth daily.   Yes [provider]  ?simvastatin (ZOCOR) 20 MG tablet Take 20 mg by mouth at bedtime.   Yes [provider]  ?tamoxifen (NOLVADEX) 20 MG tablet TAKE 1 TABLET BY MOUTH  DAILY ?Patient not taking: Reported on 10/31/2021 10/15/21   Dayton Scrape A, NP  ?warfarin (COUMADIN) 2.5 MG tablet Take 2.5 mg by mouth at bedtime.   Yes [provider]  ? ? ?Physical Exam: ?Vitals:  ? 10/31/21 1745 10/31/21 1800 10/31/21 1815 10/31/21 1830  ?BP: 102/72 101/70 105/81 105/75  ?Pulse: (!) 105 (!) 104 97 (!) 102  ?Resp: '13 20 16 18  '$ ?Temp:      ?TempSrc:      ?SpO2: 96% 97% 95% 97%  ?Weight:      ?Height:      ? ? ?Physical Exam ?Constitutional:   ?   General: She is not in acute distress. ?   Appearance: Normal appearance.  ?HENT:  ?   Head: Normocephalic and atraumatic.  ?   Mouth/Throat:  ?   Mouth: Mucous membranes are moist.  ?   Pharynx: Oropharynx is clear.  ?Eyes:  ?   Extraocular Movements: Extraocular movements intact.  ?   Pupils: Pupils are equal, round, and reactive to light.  ?Cardiovascular:  ?   Rate and Rhythm: Tachycardia present. Rhythm irregular.  ?   Pulses: Normal pulses.  ?   Heart sounds: Normal heart sounds.  ?Pulmonary:  ?   Effort: Pulmonary effort is normal. No respiratory distress.  ?   Breath sounds: Normal breath sounds.  ?Abdominal:  ?   General: Bowel sounds are normal. There is no distension.  ?   Palpations: Abdomen is soft.  ?   Tenderness: There is no abdominal tenderness.  ?Musculoskeletal:     ?   General: No swelling or deformity.  ?Skin: ?   General: Skin is warm and dry.  ?Neurological:  ?   General: No focal deficit present.  ?   Mental Status: Mental status is at baseline.  ? ?Labs on Admission: I have personally reviewed following labs and imaging studies ? ?CBC: ?Recent Labs  ?Lab 10/31/21 ?1425  ?WBC 8.4  ?NEUTROABS 6.4  ?HGB 13.5  ?HCT 39.9  ?MCV 103.9*  ?PLT 197  ? ? ?Basic Metabolic Panel: ?Recent Labs  ?Lab  10/31/21 ?1540  ?NA 137  ?K 3.2*  ?CL 101  ?CO2 22  ?GLUCOSE 127*  ?BUN 32*  ?CREATININE 0.99  ?CALCIUM 8.9  ? ? ?GFR: ?Estimated Creatinine Clearance: 38.6 mL/min (by C-G formula based on SCr of 0.99 mg/dL). ? ?Liver Function Tests: ?No results for input(s): AST, ALT, ALKPHOS, BILITOT, PROT, ALBUMIN in the last 168 hours. ? ?Urine analysis: ?   ?Component Value Date/Time  ? COLORURINE YELLOW 10/31/2021 1614  ? APPEARANCEUR HAZY (A) 10/31/2021 1614  ? LABSPEC 1.017 10/31/2021 1614  ? PHURINE 5.0 10/31/2021 1614  ? GLUCOSEU NEGATIVE 10/31/2021 1614  ? [place] NEGATIVE 10/31/2021 1614  ? [place] NEGATIVE 10/31/2021 1614  ? [place] NEGATIVE 10/31/2021 1614  ?  PROTEINUR NEGATIVE 10/31/2021 1614  ? UROBILINOGEN 1.0 09/28/2008 0641  ? NITRITE NEGATIVE 10/31/2021 1614  ? LEUKOCYTESUR NEGATIVE 10/31/2021 1614  ? ? ?Radiological Exams on Admission: ?DG Chest 2 View ? ?Result Date: 10/31/2021 ?CLINICAL DATA:  Atrial fibrillation, tachycardia EXAM: CHEST - 2 VIEW COMPARISON:  04/18/2016 FINDINGS: Transverse diameter of heart is increased. There is possible large fixed hiatal hernia. Lung fields are clear of any infiltrates or pulmonary edema. There is no significant pleural effusion or pneumothorax. Small linear densities in the right lower lung fields may suggest subsegmental atelectasis. Degenerative changes are noted in the left shoulder. IMPRESSION: Cardiomegaly. There are no signs of pulmonary edema or focal pulmonary consolidation. Linear densities in the right lower lung fields suggest subsegmental atelectasis. Possible large fixed hiatal hernia. Electronically Signed   By: Elmer Picker M.D.   On: 10/31/2021 14:53   ? ?EKG: Independently reviewed.  Atrial fibrillation with RVR at 133 bpm.  Low voltage multiple leads. ? ?Assessment/Plan ?Principal Problem: ?  Atrial fibrillation with RVR ([place]) ?Active Problems: ?  HTN (hypertension) ?  Hypothyroidism ?  Diabetes ([place]) ?  GERD (gastroesophageal reflux disease) ?   Hypokalemia ? ?Atrial fibrillation with RVR ?> Patient with known history of A-fib reportedly feeling unwell for this week noted to be tachycardic when evaluated by medical staff today.  EMS was called noted to b

## 2021-10-31 NOTE — ED Provider Notes (Signed)
?[facility] ?Provider Note ? ? ?CSN: [id number] ?Arrival date & time: 10/31/21  1358 ? ?  ? ?History ?Chief Complaint  ?Patient presents with  ? Tachycardia  ? ?Sara Merritt is a 88 y.o. female with history of atrial fibrillation on Coumadin who presents the emergency department with atrial fibrillation and RVR.  Per EMS, patient has been feeling unwell over the last week which she describes as generalized weakness and fatigue.  She was seen by medical staff today to get urinalysis performed and upon standing up she became very dizzy and disoriented.  EKG was done at that time which showed atrial fibrillation RVR.  EMS was contacted.  On their arrival she was initially hypotensive with a heart rate above 200.  Patient was alert and oriented and not complaining of any chest pain or shortness of breath at that time so they did not shock her.  On arrival, patient's heart rate was approximately in the 140s and patient was completely asymptomatic. ? 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  Patient also had a cholecystectomy last month. ? ?HPI ? ?  ? ?Home Medications ?Prior to Admission medications   ?Medication Sig Start Date End Date Taking? Authorizing Provider  ?acetaminophen (TYLENOL) 325 MG tablet Take 650 mg by mouth every 6 (six) hours as needed for mild pain or fever.   Yes [provider]  ?allopurinol (ZYLOPRIM) 100 MG tablet Take 200 mg by mouth daily.   Yes [provider]  ?calcium carbonate (OS-CAL) 1250 (500 Ca) MG chewable tablet Chew 1,250 mg by mouth daily.   Yes [provider]  ?CARTIA XT 240 MG 24 hr capsule Take 240 mg by mouth daily as needed (HR >110). 05/19/20  Yes [provider]  ?furosemide (LASIX) 20 MG tablet Take 20 mg by mouth daily.   Yes [provider]  ?Infant Care Products (DERMACLOUD EX) Apply 1 application.  topically in the morning, at noon, and at bedtime. To buttocks   Yes [provider]  ?levothyroxine (SYNTHROID) 75 MCG tablet Take 75 mcg by mouth daily.   Yes [provider]  ?meclizine (ANTIVERT) 12.5 MG tablet Take 12.5 mg by mouth 3 (three) times daily as needed for dizziness.   Yes [provider]  ?omeprazole (PRILOSEC) 20 MG capsule Take 20 mg by mouth daily.   Yes [provider]  ?Pollen Extracts (PROSTAT PO) Take 30 mLs by mouth in the morning and at bedtime.   Yes [provider]  ?potassium chloride (KLOR-CON M) 10 MEQ tablet Take 10 mEq by mouth daily.   Yes [provider]  ?simvastatin (ZOCOR) 20 MG tablet Take 20 mg by mouth at bedtime.   Yes [provider]  ?tamoxifen (NOLVADEX) 20 MG tablet TAKE 1 TABLET BY MOUTH  DAILY ?Patient not taking: Reported on 10/31/2021 10/15/21   Dayton Scrape A, NP  ?warfarin (COUMADIN) 2.5 MG tablet Take 2.5 mg by mouth at bedtime.   Yes [provider]  ?   ? ?Allergies    ?Patient has no known allergies.   ? ?Review of Systems   ?Review of Systems  ?All other systems reviewed and are negative. ? ?Physical Exam ?Updated Vital Signs ?BP 113/79   Pulse 99   Temp 97.9 ?F (36.6 ?C) (Oral)   Resp (!) 23   SpO2 93%  ?Physical Exam ? ?ED Results / Procedures /  Treatments   ?Labs ?(all labs ordered are listed, but only abnormal results are displayed) ?Labs Reviewed  ?CBC WITH DIFFERENTIAL/PLATELET - Abnormal; Notable for the following components:  ?    Result Value  ? RBC 3.84 (*)   ? MCV 103.9 (*)   ? MCH 35.2 (*)   ? All other components within normal limits  ?CULTURE, BLOOD (ROUTINE X 2)  ?CULTURE, BLOOD (ROUTINE X 2)  ?LACTIC ACID, PLASMA  ?LACTIC ACID, PLASMA  ?URINALYSIS, ROUTINE W REFLEX MICROSCOPIC  ?BASIC METABOLIC PANEL  ?PROTIME-INR  ? ? ?EKG ?EKG Interpretation ? ?Date/Time:  Wednesday October 31 2021 14:08:08 EDT ?Ventricular Rate:  145 ?PR Interval:  146 ?QRS Duration: 91 ?QT  Interval:  318 ?QTC Calculation: 494 ?R Axis:   98 ?Text Interpretation: Atrial fibrillation with rapid V-rate Ventricular premature complex Right axis deviation Low voltage, precordial leads Abnormal R-wave progression, early transition Repolarization abnormality, prob rate related Confirmed by Octaviano Glow [phone]) on 10/31/2021 3:39:56 PM ? ?Radiology ?DG Chest 2 View ? ?Result Date: 10/31/2021 ?CLINICAL DATA:  Atrial fibrillation, tachycardia EXAM: CHEST - 2 VIEW COMPARISON:  04/18/2016 FINDINGS: Transverse diameter of heart is increased. There is possible large fixed hiatal hernia. Lung fields are clear of any infiltrates or pulmonary edema. There is no significant pleural effusion or pneumothorax. Small linear densities in the right lower lung fields may suggest subsegmental atelectasis. Degenerative changes are noted in the left shoulder. IMPRESSION: Cardiomegaly. There are no signs of pulmonary edema or focal pulmonary consolidation. Linear densities in the right lower lung fields suggest subsegmental atelectasis. Possible large fixed hiatal hernia. Electronically Signed   By: Elmer Picker M.D.   On: 10/31/2021 14:53   ? ?Procedures ?Procedures  ? ? ?Medications Ordered in ED ?Medications  ?sodium chloride 0.9 % bolus 500 mL (500 mLs Intravenous New Bag/Given 10/31/21 1425)  ? ? ?ED Course/ Medical Decision Making/ A&P ?Clinical Course as of 10/31/21 1549  ?Wed Oct 31, 2021  ?1437 I discussed this case with my attending physician who cosigned this note including patient's presenting symptoms, physical exam, and planned diagnostics and interventions. Attending physician stated agreement with plan or made changes to plan which were implemented.  ? ?Attending physician assessed patient at bedside. ? ? [CF]  ?1511 On reevaluation, patient was still tachycardic in atrial fibrillation RVR between 130 and 140.  500 mL bolus was not fully run as the IV was infiltrated.  Nursing staff is faxing it currently.   If bolus does not improve her heart rate she will likely need calcium channel blocker. [CF]  ?  ?Clinical Course User Index ?[CF] Myna Bright M, PA-C  ? ?                        ?Medical Decision Making ?Amount and/or Complexity of Data Reviewed ?Labs: ordered. ?Radiology: ordered. ? ? ?This patient presents to the ED for concern of atrial fibrillation with RVR, this involves an extensive number of treatment options, and is a complaint that carries with it a high risk of complications and morbidity.  The differential diagnosis includes electrolyte abnormalities, infectious etiology, anemia, or dehydration. ? ? ?Co morbidities that complicate the patient evaluation ? ?Atrial fibrillation on Coumadin ? ? ?Additional history obtained: ? ?Additional history obtained from EMS and nursing note ? ? ?Lab Tests: ? ?I Ordered, and personally interpreted labs.  The pertinent results include: CBC show any signs of anemia or leukocytosis.  Rest of her labs are still  pending ? ? ?Imaging Studies ordered: ? ?I ordered imaging studies including chest x-ray ?I independently visualized and interpreted imaging which showed cardiomegaly. ?I agree with the radiologist interpretation ? ? ?Cardiac Monitoring: ? ?The patient was maintained on a cardiac monitor.  I personally viewed and interpreted the cardiac monitored which showed an underlying rhythm of: Atrial fibrillation with RVR. ? ? ?Medicines ordered and prescription drug management: ? ?I ordered medication including Bolus of fluid for dehydration. ?Reevaluation of the patient after these medicines showed that the patient stayed the same ?I have reviewed the patients home medicines and have made adjustments as neede ? ? ?Problem List / ED Course: ? ?Atrial fibrillation with rapid ventricular response.  Basic labs, cultures, UA, lactic, and PT/INR were all obtained.  Most of her work-up is still pending.  Patient still in atrial fibrillation RVR.  Bolus of fluid is currently  running.  If this does not improve her heart rate she will likely need calcium channel blocker for rate control.  She is anticoagulated on Coumadin. Patient will likely need to come into the hospital for further evaluation.

## 2021-10-31 NOTE — ED Triage Notes (Signed)
Pt arrived via [facility] from [facility]. Per EMS pt c/o SOB, dizziness, and weakness x1 week. Facility attempted to obtain urine culture however pt was unable to stand and was found to be hypotensive with weak radial pulses until laying in supine position. Hx afib and dementia. Family reports to EMS decreased oral intake recently and increased stress. Alert to baseline on arrival to ED.  ? ?HR 190-210 which decreased to 130-150 after NS bolus.  ?113/61 BP, initially hypotensive  ?500 mL NS admin by EMS ? ? ?

## 2021-10-31 NOTE — Progress Notes (Addendum)
ANTICOAGULATION CONSULT NOTE - Initial Consult ? ?Pharmacy Consult for warfarin ?Indication: atrial fibrillation ? ?No Known Allergies ? ?Patient Measurements: ?Height: '5\' 4"'$  (162.6 cm) ?Weight: 76.7 kg (169 lb) ?IBW/kg (Calculated) : 54.7 ? ?Vital Signs: ?Temp: 97.9 ?F (36.6 ?C) (03/22 1405) ?Temp Source: Oral (03/22 1405) ?BP: 105/75 (03/22 1830) ?Pulse Rate: 102 (03/22 1830) ? ?Labs: ?Recent Labs  ?  10/31/21 ?1425 10/31/21 ?1540  ?HGB 13.5  --   ?HCT 39.9  --   ?PLT 197  --   ?LABPROT  --  18.7*  ?INR  --  1.6*  ?CREATININE  --  0.99  ? ? ?Estimated Creatinine Clearance: 38.6 mL/min (by C-G formula based on SCr of 0.99 mg/dL). ? ? ?Medical History: ?No past medical history on file. ? ?Medications:  ?(Not in a hospital admission)  ?Scheduled:  ? [START ON 11/01/2021] allopurinol  200 mg Oral Daily  ? [START ON 11/01/2021] calcium carbonate  1,250 mg Oral Daily  ? [START ON 11/01/2021] levothyroxine  75 mcg Oral Daily  ? [START ON 11/01/2021] pantoprazole  40 mg Oral Daily  ? potassium chloride  30 mEq Oral Once  ? simvastatin  20 mg Oral QHS  ? ?Infusions:  ? sodium chloride    ? ? ?Assessment: ?Patient presented from SNF with shortness of breath, dizziness, and weakness.Patient was found to be in afib RVR. Pharmacy consulted to dose warfarin for afib.Patient reports surgery one month prior where warfarin was held. After surgery, the SNF where the patient stays decreased her warfarin dose to 2.5 mg daily. Patient taking antibiotics prior to presentation.  ? ?Patient and family were in the room. They reported that the patient takes 2.5 mg every night at bedtime. They plan on speaking to their cardiologist soon to ask about transitioning to Eliquis. ? ?CBC stable. INR subtherapeutic at 1.6 on admission. ? ?  ?Plan:  ?Give warfarin 4 mg tonight ?Daily INR ?Consider transitioning to Eliquis per patient and family request ? ?Thank you for allowing pharmacy to participate in this patient's care. ? ?Reatha Harps, PharmD ?PGY1  Pharmacy Resident ?10/31/2021 7:35 PM ?Check [email or web address] for unit specific pharmacy number ? ? ? ?

## 2021-10-31 NOTE — ED Provider Notes (Signed)
Accepted handoff at shift change from [facility]. Please see prior provider note for more detail.  ? ?Briefly: Patient is 88 y.o.  ? ?DDX: concern for On coumadin -- weak, fatigued for the last week. Went to see PA at nursing home -- dizzy, lightheaded = AFIB RVR. EKG ~200, hypotensive by EMS -- 130s-140s on arrival. Dehydrated on exam. Has been fighting "clitoris infection"? ? ?Plan: likely admit for obs, pending workup. ? ?Blood pressure with significant drop systolic in the 67C after attempted Cardizem load and bolus, discontinued at this time, will administer more fluids at this time.  Will consult for admission at this time. ? ?Dr. Trilby Drummer to admit. Patient and family informed of plan. ? ?RISR ? ?EDTHIS ? ?  ?Anselmo Pickler, PA-C ?10/31/21 1834 ? ?  ?Wyvonnia Dusky, MD ?10/31/21 1840 ? ?

## 2021-11-01 ENCOUNTER — Encounter (HOSPITAL_COMMUNITY): Payer: Self-pay | Admitting: Internal Medicine

## 2021-11-01 ENCOUNTER — Observation Stay (HOSPITAL_COMMUNITY): Payer: Medicare Other

## 2021-11-01 ENCOUNTER — Other Ambulatory Visit (HOSPITAL_COMMUNITY): Payer: Self-pay

## 2021-11-01 ENCOUNTER — Other Ambulatory Visit: Payer: Self-pay

## 2021-11-01 DIAGNOSIS — B9689 Other specified bacterial agents as the cause of diseases classified elsewhere: Secondary | ICD-10-CM | POA: Diagnosis present

## 2021-11-01 DIAGNOSIS — Z7901 Long term (current) use of anticoagulants: Secondary | ICD-10-CM | POA: Diagnosis not present

## 2021-11-01 DIAGNOSIS — Z79890 Hormone replacement therapy (postmenopausal): Secondary | ICD-10-CM | POA: Diagnosis not present

## 2021-11-01 DIAGNOSIS — M79604 Pain in right leg: Secondary | ICD-10-CM | POA: Diagnosis not present

## 2021-11-01 DIAGNOSIS — E872 Acidosis, unspecified: Secondary | ICD-10-CM | POA: Diagnosis present

## 2021-11-01 DIAGNOSIS — I82452 Acute embolism and thrombosis of left peroneal vein: Secondary | ICD-10-CM | POA: Diagnosis present

## 2021-11-01 DIAGNOSIS — I82433 Acute embolism and thrombosis of popliteal vein, bilateral: Secondary | ICD-10-CM | POA: Diagnosis present

## 2021-11-01 DIAGNOSIS — I4891 Unspecified atrial fibrillation: Secondary | ICD-10-CM | POA: Diagnosis not present

## 2021-11-01 DIAGNOSIS — Z9049 Acquired absence of other specified parts of digestive tract: Secondary | ICD-10-CM | POA: Diagnosis not present

## 2021-11-01 DIAGNOSIS — Z20822 Contact with and (suspected) exposure to covid-19: Secondary | ICD-10-CM | POA: Diagnosis present

## 2021-11-01 DIAGNOSIS — I4821 Permanent atrial fibrillation: Secondary | ICD-10-CM | POA: Diagnosis present

## 2021-11-01 DIAGNOSIS — E039 Hypothyroidism, unspecified: Secondary | ICD-10-CM | POA: Diagnosis present

## 2021-11-01 DIAGNOSIS — Z853 Personal history of malignant neoplasm of breast: Secondary | ICD-10-CM | POA: Diagnosis not present

## 2021-11-01 DIAGNOSIS — E119 Type 2 diabetes mellitus without complications: Secondary | ICD-10-CM | POA: Diagnosis present

## 2021-11-01 DIAGNOSIS — K219 Gastro-esophageal reflux disease without esophagitis: Secondary | ICD-10-CM | POA: Diagnosis not present

## 2021-11-01 DIAGNOSIS — M79605 Pain in left leg: Secondary | ICD-10-CM | POA: Diagnosis not present

## 2021-11-01 DIAGNOSIS — I959 Hypotension, unspecified: Secondary | ICD-10-CM | POA: Diagnosis present

## 2021-11-01 DIAGNOSIS — Z79899 Other long term (current) drug therapy: Secondary | ICD-10-CM | POA: Diagnosis not present

## 2021-11-01 DIAGNOSIS — I10 Essential (primary) hypertension: Secondary | ICD-10-CM | POA: Diagnosis not present

## 2021-11-01 DIAGNOSIS — Z87891 Personal history of nicotine dependence: Secondary | ICD-10-CM | POA: Diagnosis not present

## 2021-11-01 DIAGNOSIS — E876 Hypokalemia: Secondary | ICD-10-CM

## 2021-11-01 DIAGNOSIS — I82412 Acute embolism and thrombosis of left femoral vein: Secondary | ICD-10-CM | POA: Diagnosis present

## 2021-11-01 DIAGNOSIS — Z79810 Long term (current) use of selective estrogen receptor modulators (SERMs): Secondary | ICD-10-CM | POA: Diagnosis not present

## 2021-11-01 DIAGNOSIS — I82442 Acute embolism and thrombosis of left tibial vein: Secondary | ICD-10-CM | POA: Diagnosis present

## 2021-11-01 DIAGNOSIS — Z23 Encounter for immunization: Secondary | ICD-10-CM | POA: Diagnosis not present

## 2021-11-01 DIAGNOSIS — I272 Pulmonary hypertension, unspecified: Secondary | ICD-10-CM | POA: Diagnosis present

## 2021-11-01 DIAGNOSIS — I824Y9 Acute embolism and thrombosis of unspecified deep veins of unspecified proximal lower extremity: Secondary | ICD-10-CM | POA: Diagnosis not present

## 2021-11-01 DIAGNOSIS — R7881 Bacteremia: Secondary | ICD-10-CM | POA: Diagnosis present

## 2021-11-01 DIAGNOSIS — I824Y3 Acute embolism and thrombosis of unspecified deep veins of proximal lower extremity, bilateral: Secondary | ICD-10-CM | POA: Diagnosis not present

## 2021-11-01 LAB — ECHOCARDIOGRAM COMPLETE
AR max vel: 0.95 cm2
AV Area VTI: 0.95 cm2
AV Area mean vel: 0.97 cm2
AV Mean grad: 16.5 mmHg
AV Peak grad: 26.7 mmHg
Ao pk vel: 2.59 m/s
Area-P 1/2: 4.89 cm2
Calc EF: 51.4 %
Height: 64 in
P 1/2 time: 388 msec
S' Lateral: 2.7 cm
Single Plane A2C EF: 51.1 %
Single Plane A4C EF: 51.5 %
Weight: 2663.16 oz

## 2021-11-01 LAB — BLOOD CULTURE ID PANEL (REFLEXED) - BCID2

## 2021-11-01 LAB — GLUCOSE, CAPILLARY
Glucose-Capillary: 115 mg/dL — ABNORMAL HIGH (ref 70–99)
Glucose-Capillary: 151 mg/dL — ABNORMAL HIGH (ref 70–99)
Glucose-Capillary: 94 mg/dL (ref 70–99)
Glucose-Capillary: 117 mg/dL — ABNORMAL HIGH (ref 70–99)

## 2021-11-01 LAB — PROTIME-INR
INR: 1.3 — ABNORMAL HIGH (ref 0.8–1.2)
Prothrombin Time: 16.2 seconds — ABNORMAL HIGH (ref 11.4–15.2)

## 2021-11-01 LAB — CBC
HCT: 36.1 % (ref 36.0–46.0)
Hemoglobin: 11.9 g/dL — ABNORMAL LOW (ref 12.0–15.0)
MCH: 35.1 pg — ABNORMAL HIGH (ref 26.0–34.0)
MCHC: 33 g/dL (ref 30.0–36.0)
MCV: 106.5 fL — ABNORMAL HIGH (ref 80.0–100.0)
Platelets: 142 10*3/uL — ABNORMAL LOW (ref 150–400)
RBC: 3.39 MIL/uL — ABNORMAL LOW (ref 3.87–5.11)
RDW: 14 % (ref 11.5–15.5)
WBC: 6.8 10*3/uL (ref 4.0–10.5)
nRBC: 0 % (ref 0.0–0.2)

## 2021-11-01 LAB — LACTIC ACID, PLASMA: Lactic Acid, Venous: 2.2 mmol/L (ref 0.5–1.9)

## 2021-11-01 LAB — BASIC METABOLIC PANEL
Anion gap: 9 (ref 5–15)
BUN: 27 mg/dL — ABNORMAL HIGH (ref 8–23)
CO2: 23 mmol/L (ref 22–32)
Calcium: 8.4 mg/dL — ABNORMAL LOW (ref 8.9–10.3)
Chloride: 107 mmol/L (ref 98–111)
Creatinine, Ser: 0.86 mg/dL (ref 0.44–1.00)
GFR, Estimated: >60 mL/min (ref >60)
Glucose, Bld: 106 mg/dL — ABNORMAL HIGH (ref 70–99)
Potassium: 3.9 mmol/L (ref 3.5–5.1)
Sodium: 139 mmol/L (ref 135–145)

## 2021-11-01 MED ORDER — DIGOXIN 0.25 MG/ML IJ SOLN
0.2500 mg | Freq: Once | INTRAMUSCULAR | Status: AC
  Administered 2021-11-01: 0.25 mg via INTRAVENOUS
  Filled 2021-11-01: qty 1

## 2021-11-01 MED ORDER — SODIUM CHLORIDE 0.9 % IV SOLN: INTRAVENOUS | Status: AC

## 2021-11-01 MED ORDER — WARFARIN SODIUM 2 MG PO TABS
4.0000 mg | ORAL_TABLET | Freq: Once | ORAL | Status: AC
  Administered 2021-11-01: 4 mg via ORAL
  Filled 2021-11-01: qty 2

## 2021-11-01 MED ORDER — SODIUM CHLORIDE 0.9 % IV BOLUS
500.0000 mL | Freq: Once | INTRAVENOUS | Status: AC
  Administered 2021-11-01: 500 mL via INTRAVENOUS

## 2021-11-01 MED ORDER — CEFAZOLIN SODIUM-DEXTROSE 2-4 GM/100ML-% IV SOLN
2.0000 g | Freq: Two times a day (BID) | INTRAVENOUS | Status: DC
  Filled 2021-11-01 (×2): qty 100

## 2021-11-01 MED ORDER — CEFAZOLIN SODIUM-DEXTROSE 2-4 GM/100ML-% IV SOLN
2.0000 g | Freq: Three times a day (TID) | INTRAVENOUS | Status: DC
  Administered 2021-11-01 – 2021-11-03 (×5): 2 g via INTRAVENOUS
  Filled 2021-11-01 (×6): qty 100

## 2021-11-01 MED ORDER — MAGNESIUM SULFATE 2 GM/50ML IV SOLN
2.0000 g | Freq: Once | INTRAVENOUS | Status: AC
  Administered 2021-11-01: 2 g via INTRAVENOUS
  Filled 2021-11-01: qty 50

## 2021-11-01 MED ORDER — BOOST / RESOURCE BREEZE PO LIQD CUSTOM
1.0000 | Freq: Three times a day (TID) | ORAL | Status: DC
Start: 2021-11-01 — End: 2021-11-10
  Administered 2021-11-01 – 2021-11-09 (×21): 1 via ORAL
  Filled 2021-11-01 (×3): qty 1

## 2021-11-01 MED ORDER — MAGNESIUM OXIDE -MG SUPPLEMENT 400 (240 MG) MG PO TABS
400.0000 mg | ORAL_TABLET | Freq: Two times a day (BID) | ORAL | Status: DC
  Administered 2021-11-01 – 2021-11-09 (×18): 400 mg via ORAL
  Filled 2021-11-01 (×18): qty 1

## 2021-11-01 NOTE — TOC Benefit Eligibility Note (Signed)
Patient Advocate Encounter  Insurance verification completed.    The patient is currently admitted and upon discharge could be taking Eliquis 5 mg.  The current 30 day co-pay is, $47.00.   The patient is insured through [employer] Medicare Part D    Huntington Leverich, CPhT Pharmacy Patient Advocate Specialist [facility] Patient Advocate Team Direct Number: [phone]  Fax: [phone]        

## 2021-11-01 NOTE — Progress Notes (Signed)
?   10/31/21 2152  ?Assess: MEWS Score  ?Temp 97.9 ?F (36.6 ?C)  ?BP 114/67  ?Pulse Rate (!) 120  ?ECG Heart Rate (!) 125  ?Resp 18  ?Level of Consciousness Alert  ?SpO2 95 %  ?O2 Device Room Air  ?Assess: MEWS Score  ?MEWS Temp 0  ?MEWS Systolic 0  ?MEWS Pulse 2  ?MEWS RR 0  ?MEWS LOC 0  ?MEWS Score 2  ?MEWS Score Color Yellow  ?Assess: if the MEWS score is Yellow or Red  ?Were vital signs taken at a resting state? Yes  ?Focused Assessment No change from prior assessment  ?Early Detection of Sepsis Score *See Row Information* Low  ?MEWS guidelines implemented *See Row Information* Yes  ?Treat  ?MEWS Interventions Administered scheduled meds/treatments  ?Pain Scale 0-10  ?Pain Score 0  ?Patients Stated Pain Goal 0  ?Take Vital Signs  ?Increase Vital Sign Frequency  Yellow: Q 2hr X 2 then Q 4hr X 2, if remains yellow, continue Q 4hrs  ?Escalate  ?MEWS: Escalate Yellow: discuss with charge nurse/RN and consider discussing with provider and RRT  ?Notify: Charge Nurse/RN  ?Name of Charge Nurse/RN Notified Chanda Busing  ?Date Charge Nurse/RN Notified 10/31/21  ?Time Charge Nurse/RN Notified 2130  ?Document  ?Patient Outcome Stabilized after interventions  ?Progress note created (see row info) Yes  ? ? ?

## 2021-11-01 NOTE — Progress Notes (Signed)
?   11/01/21 1100  ?Assess: MEWS Score  ?Temp 97.7 ?F (36.5 ?C)  ?BP (!) 87/61  ?Pulse Rate 93  ?ECG Heart Rate (!) 126  ?Resp (!) 23  ?SpO2 97 %  ?O2 Device Room Air  ?Assess: MEWS Score  ?MEWS Temp 0  ?MEWS Systolic 1  ?MEWS Pulse 2  ?MEWS RR 1  ?MEWS LOC 0  ?MEWS Score 4  ?MEWS Score Color Red  ?Assess: if the MEWS score is Yellow or Red  ?Were vital signs taken at a resting state? Yes  ?Focused Assessment No change from prior assessment  ?Early Detection of Sepsis Score *See Row Information* Low  ?MEWS guidelines implemented *See Row Information* Yes  ?Take Vital Signs  ?Increase Vital Sign Frequency  Red: Q 1hr X 4 then Q 4hr X 4, if remains red, continue Q 4hrs  ?Escalate  ?MEWS: Escalate Red: discuss with charge nurse/RN and provider, consider discussing with RRT  ?Notify: Charge Nurse/RN  ?Name of Charge Nurse/RN Notified CJ  ?Date Charge Nurse/RN Notified 11/01/21  ?Time Charge Nurse/RN Notified 1112  ?Notify: Provider  ?Provider Name/Title Pokhrel MD  ?Date Provider Notified 11/01/21  ?Time Provider Notified 1108  ?Notification Type Page  ?Document  ?Patient Outcome Other (Comment) ?(receiving interventions, pt remains on unit)  ?Progress note created (see row info) Yes  ? ?Pt has received 2x 500cc bolus, BP remains to fluctuate around 80s/60s and asymptomatic. X1 dose of IV digoxin given. MD Pokhrel continues to be updated on pt's status. ?

## 2021-11-01 NOTE — Progress Notes (Signed)
Patient is from [facility].CSW awaiting PT/OT recommendations for patient. CSW will continue to follow and assist with patients dc planning needs. ?

## 2021-11-01 NOTE — Progress Notes (Signed)
ANTICOAGULATION CONSULT NOTE  ? ?Pharmacy Consult for warfarin ?Indication: atrial fibrillation ? ?No Known Allergies ? ?Patient Measurements: ?Height: '5\' 4"'$  (162.6 cm) ?Weight: 75.5 kg (166 lb 7.2 oz) ?IBW/kg (Calculated) : 54.7 ? ?Vital Signs: ?Temp: 98.2 ?F (36.8 ?C) (03/23 0801) ?Temp Source: Axillary (03/23 0801) ?BP: 100/66 (03/23 0801) ?Pulse Rate: 96 (03/23 0801) ? ?Labs: ?Recent Labs  ?  10/31/21 ?1425 10/31/21 ?1540 11/01/21 ?0428  ?HGB 13.5  --  11.9*  ?HCT 39.9  --  36.1  ?PLT 197  --  142*  ?LABPROT  --  18.7* 16.2*  ?INR  --  1.6* 1.3*  ?CREATININE  --  0.99 0.86  ? ? ? ?Estimated Creatinine Clearance: 44.1 mL/min (by C-G formula based on SCr of 0.86 mg/dL). ? ? ?Medical History: ?Past Medical History:  ?Diagnosis Date  ? Atrial fibrillation ([place])   ? ?Infusions:  ? amiodarone 30 mg/hr (11/01/21 0128)  ? ? ?Assessment: ?Patient presented from SNF with shortness of breath, dizziness, and weakness.Patient was found to be in afib RVR. Pharmacy consulted to dose warfarin for afib.Patient reports surgery one month prior where warfarin was held. After surgery, the SNF where the patient stays decreased her warfarin dose to 2.5 mg daily. Patient taking antibiotics prior to presentation.  ? ?Patient and family were in the room. They reported that the patient takes 2.5 mg every night at bedtime. They plan on speaking to their cardiologist soon to ask about transitioning to Eliquis. ? ?INR subtherapeutic at 1.6 on admission, now down to 1.3. Hemoglobin down overnight from 13.5 to 11.9. No bleeding issues noted.  ? ?Plan:  ?Repeat higher dose warfarin 4 mg tonight ?Daily INR ?Consider transitioning to Eliquis per patient and family request ? ?Thank you for allowing pharmacy to participate in this patient's care. ? ?Erin Hearing PharmD., BCPS ?Clinical Pharmacist ?11/01/2021 10:15 AM ? ?

## 2021-11-01 NOTE — Progress Notes (Addendum)
Pharmacy Antibiotic Note ? ?Sara Merritt is a 88 y.o. female admitted on 10/31/2021 with  r/o bacteremia .  Pharmacy has been consulted for cefazolin dosing. ? ?Lab called with BCID result of 1/5 bottles that came back with staph species without resistance detected. Confirmed with micro twice and the MecA gene was not detected. Likely to be a contaminant but MD would like to start abx until can be assessed in AM due to hypotension and LA 2.2. D/w MD want we will use cefaz instead of vanc.  ? ?CrCl ~45 ? ?Plan: ?Cefazolin 2g IV q8 ?F/u with cultures ? ?Height: '5\' 4"'$  (162.6 cm) ?Weight: 75.5 kg (166 lb 7.2 oz) ?IBW/kg (Calculated) : 54.7 ? ?Temp (24hrs), Avg:97.9 ?F (36.6 ?C), Min:97.5 ?F (36.4 ?C), Max:98.3 ?F (36.8 ?C) ? ?Recent Labs  ?Lab 10/31/21 ?1425 10/31/21 ?1540 10/31/21 ?1640 11/01/21 ?0428 11/01/21 ?1619  ?WBC 8.4  --   --  6.8  --   ?CREATININE  --  0.99  --  0.86  --   ?LATICACIDVEN 2.2*  --  2.4*  --  2.2*  ?  ?Estimated Creatinine Clearance: 44.1 mL/min (by C-G formula based on SCr of 0.86 mg/dL).   ? ?No Known Allergies ? ?Antimicrobials this admission: ?3/23 cefazolin>> ? ?Dose adjustments this admission: ? ? ?Microbiology results: ?3/22 blood>>1/5 staph species ? ? ?Onnie Boer, PharmD, BCIDP, AAHIVP, CPP ?Infectious Disease Pharmacist ?11/01/2021 6:53 PM ? ? ? ?

## 2021-11-01 NOTE — Progress Notes (Signed)
PT Cancellation Note ? ?Patient Details ?Name: Sara Merritt ?MRN: [id number] ?DOB: April 01, 1930 ? ? ?Cancelled Treatment:    Reason Eval/Treat Not Completed: Fatigue limiting ability to participate. Pt politely declines due to fatigue. Will try again tomorrow. Per daughter pt was amb until early Feb hospitalization. She went SNF and was originally amb some with therapy but that all stopped because she kept passing out when they tried to get her up. At ALF she has also passed out when they tried to get her up  into standing/toilet etc. Daughter reports BP down and HR up when this happens. Hopefully if medical issues can be improved she will be able to improve her mobility. ? ? ?Shary Decamp [facility] ?11/01/2021, 3:58 PM ?Suanne Marker PT ?Acute Rehabilitation Services ?Pager [phone] ?Office [phone] ? ?

## 2021-11-01 NOTE — Progress Notes (Signed)
?PROGRESS NOTE ? ?Sara Merritt QIW:[id number] DOB: 1929-10-05 DOA: 10/31/2021 ?PCP: Marco Collie, MD ? ? LOS: 0 days  ? ?Brief narrative: ?Sara Merritt is a 88 y.o. female with medical history significant of breast cancer status post lumpectomy, hypertension, CKD, atrial fibrillation, hypothyroidism, diabetes mellitus with tachycardia feeling unwell fatigue and weakness with dizziness on standing.  EMS was called in and patient was noted to be tachycardic in the 200s with hypotension.  Family reported that the patient was supposed to go to Cardiology regarding anticoagulation.  In the ED, patient had transient hypotension after receiving Cardizem drip so was started on amiodarone drip.  Initial labs showed a potassium of 3.2 INR at the 1.6 and lactate elevated at 2.2 and 2.4.  Chest x-ray showed cardiomegaly.  Patient also received 500 cc of fluid in the ED and 30 mEq of potassium and was admitted hospital for further evaluation and treatment.   ? ?Assessment/Plan: ? ?Principal Problem: ?  Atrial fibrillation with RVR ([place]) ?Active Problems: ?  HTN (hypertension) ?  Hypothyroidism ?  Diabetes ([place]) ?  GERD (gastroesophageal reflux disease) ?  Hypokalemia ? ? ?Atrial fibrillation with RVR ?On presentation initial RVR up to 200s with hypotension.  Received IV fluid bolus.  On amiodarone drip since could not tolerate Cardizem.  Still hypotensive so we will give 500 mL of normal saline bolus.  I have consulted cardiology for further evaluation at this time.  On warfarin.  Pharmacy to dose.  Continue to replenish electrolytes.  Check 2D echocardiogram.   ?  ?Hypokalemia ?Initial potassium was 3.2.  Has been replenished.  Latest potassium of 3.9. ?  ?Hypothyroidism ?Continue Synthroid. ? ?Hypertension ?Currently hypotensive.  We will give a normal saline bolus.  Continue to monitor blood pressure. ? ?Diabetes mellitus type II ?Continue sliding scale insulin for now.  Latest POC glucose of 115.  Check  A1c. ? ?GERD ?Continue PPI. ? ?Hyperlipidemia ?Continue simvastatin ? ?Hypomagnesemia.  Magnesium of 1.5 yesterday.  We will continue to replenish through IV and orally.  Check levels in a.m. ? ?DVT prophylaxis:  ?warfarin (COUMADIN) tablet 4 mg  ? ? ?Code Status: Full code ? ?Family Communication: Spoke with the patient's family-daughter at bedside. ? ?Status is: Observation ? ?The patient will require care spanning > 2 midnights and should be moved to inpatient because: Atrial fibrillation with RVR, hypotension, cardiology evaluation ?  ?Consultants: ?Cardiology ? ?Procedures: ?None ? ?Anti-infectives: ? ?None ? ?Anti-infectives (From admission, onward)  ? ? None  ? ?  ? ? ?Subjective: ?Today, patient was seen and examined at bedside.  Patient's daughter at bedside.  Patient denies any chest pain.  Slightly sleepy at the time of my evaluation.  Denies nausea or vomiting.  Nursing staff reported hypotension with tachycardia.  Complains of mild pain in the left leg. ? ?Objective: ?Vitals:  ? 11/01/21 0801 11/01/21 1100  ?BP: 100/66 (!) 87/61  ?Pulse: 96 93  ?Resp: 18 (!) 23  ?Temp: 98.2 ?F (36.8 ?C) 97.7 ?F (36.5 ?C)  ?SpO2: 94% 97%  ? ? ?Intake/Output Summary (Last 24 hours) at 11/01/2021 1128 ?Last data filed at 10/31/2021 1701 ?Gross per 24 hour  ?Intake 504.47 ml  ?Output --  ?Net 504.47 ml  ? ?Filed Weights  ? 10/31/21 1700 10/31/21 2152  ?Weight: 76.7 kg 75.5 kg  ? ?Body mass index is 28.57 kg/m?.  ? ?Physical Exam: ?GENERAL: Patient is mildly somnolent but responsive, ?HENT: No scleral pallor or icterus. Pupils equally reactive to light.  Oral mucosa is moist ?NECK: is supple, no gross swelling noted. ?CHEST: Clear to auscultation. No crackles or wheezes.  Diminished breath sounds bilaterally. ?CVS: S1 and S2 heard, irregularly irregular rate with tachycardia. ?ABDOMEN: Soft, non-tender, bowel sounds are present. ?EXTREMITIES: No edema.  Left leg tenderness on palpation. ?CNS: Cranial nerves are intact. No focal  motor deficits. ?SKIN: warm and dry without rashes. ? ?Data Review: I have personally reviewed the following laboratory data and studies, ? ?CBC: ?Recent Labs  ?Lab 10/31/21 ?1425 11/01/21 ?0428  ?WBC 8.4 6.8  ?NEUTROABS 6.4  --   ?HGB 13.5 11.9*  ?HCT 39.9 36.1  ?MCV 103.9* 106.5*  ?PLT 197 142*  ? ?Basic Metabolic Panel: ?Recent Labs  ?Lab 10/31/21 ?1540 10/31/21 ?2142 11/01/21 ?0428  ?NA 137  --  139  ?K 3.2*  --  3.9  ?CL 101  --  107  ?CO2 22  --  23  ?GLUCOSE 127*  --  106*  ?BUN 32*  --  27*  ?CREATININE 0.99  --  0.86  ?CALCIUM 8.9  --  8.4*  ?MG  --  1.5*  --   ? ?Liver Function Tests: ?No results for input(s): AST, ALT, ALKPHOS, BILITOT, PROT, ALBUMIN in the last 168 hours. ?No results for input(s): LIPASE, AMYLASE in the last 168 hours. ?No results for input(s): AMMONIA in the last 168 hours. ?Cardiac Enzymes: ?No results for input(s): CKTOTAL, CKMB, CKMBINDEX, TROPONINI in the last 168 hours. ?BNP (last 3 results) ?No results for input(s): BNP in the last 8760 hours. ? ?ProBNP (last 3 results) ?No results for input(s): PROBNP in the last 8760 hours. ? ?CBG: ?Recent Labs  ?Lab 10/31/21 ?2149 11/01/21 ?0826  ?GLUCAP 114* 115*  ? ?Recent Results (from the past 240 hour(s))  ?Culture, blood (routine x 2)     Status: None (Preliminary result)  ? Collection Time: 10/31/21  4:40 PM  ? Specimen: BLOOD  ?Result Value Ref Range Status  ? Specimen Description BLOOD LEFT ANTECUBITAL  Final  ? Special Requests   Final  ?  BOTTLES DRAWN AEROBIC AND ANAEROBIC Blood Culture adequate volume  ? Culture   Final  ?  NO GROWTH < 24 HOURS ?Performed at [facility], [place] [address], [place], [place] [address] ?  ? Report Status PENDING  Incomplete  ?  ? ?Studies: ?DG Chest 2 View ? ?Result Date: 10/31/2021 ?CLINICAL DATA:  Atrial fibrillation, tachycardia EXAM: CHEST - 2 VIEW COMPARISON:  04/18/2016 FINDINGS: Transverse diameter of heart is increased. There is possible large fixed hiatal hernia. Lung fields are clear of  any infiltrates or pulmonary edema. There is no significant pleural effusion or pneumothorax. Small linear densities in the right lower lung fields may suggest subsegmental atelectasis. Degenerative changes are noted in the left shoulder. IMPRESSION: Cardiomegaly. There are no signs of pulmonary edema or focal pulmonary consolidation. Linear densities in the right lower lung fields suggest subsegmental atelectasis. Possible large fixed hiatal hernia. Electronically Signed   By: Elmer Picker M.D.   On: 10/31/2021 14:53   ? ? ? ?Flora Lipps, MD  ?[facility] ?11/01/2021  ?If 7PM-7AM, please contact night-coverage ? ? ? ? ? ? ? ?  ?

## 2021-11-01 NOTE — Progress Notes (Signed)
PT Cancellation Note ? ?Patient Details ?Name: Dreamer Carillo ?MRN: [id number] ?DOB: 07-02-1930 ? ? ?Cancelled Treatment:    Reason Eval/Treat Not Completed: Medical issues which prohibited therapy. Pt with RHR 120's. Will check back later. ? ? ?Shary Decamp [facility] ?11/01/2021, 10:46 AM ?Suanne Marker PT ?Acute Rehabilitation Services ?Pager [phone] ?Office [phone] ? ?

## 2021-11-01 NOTE — Progress Notes (Signed)
PHARMACY - PHYSICIAN COMMUNICATION ?CRITICAL VALUE ALERT - BLOOD CULTURE IDENTIFICATION (BCID) ? ?Sara Merritt is an 88 y.o. female who presented to [facility] on 10/31/2021 with a chief complaint of tachycardia. ? ?Assessment:  BCx growing Staph spp in 1 of 5 bottles, no resistance reported.  Afebrile, WBC WNL, lactate down to 2.2. ? ?Name of physician (or Provider) Contacted: Dr. Louanne Belton ? ?Current antibiotics: none ? ?Changes to prescribed antibiotics recommended:  ?Recommendations declined by provider due to clinical deterioration (hypotensive and elevated lactate).  Start antibiotic and will de-escalate if improving per provider. ? ?Results for orders placed or performed during the hospital encounter of 10/31/21  ?Blood Culture ID Panel (Reflexed) (Collected: 10/31/2021  2:20 PM)  ?Result Value Ref Range  ? Enterococcus faecalis NOT DETECTED NOT DETECTED  ? Enterococcus Faecium NOT DETECTED NOT DETECTED  ? Listeria monocytogenes NOT DETECTED NOT DETECTED  ? Staphylococcus species DETECTED (A) NOT DETECTED  ? Staphylococcus aureus (BCID) NOT DETECTED NOT DETECTED  ? Staphylococcus epidermidis NOT DETECTED NOT DETECTED  ? Staphylococcus lugdunensis NOT DETECTED NOT DETECTED  ? Streptococcus species NOT DETECTED NOT DETECTED  ? Streptococcus agalactiae NOT DETECTED NOT DETECTED  ? Streptococcus pneumoniae NOT DETECTED NOT DETECTED  ? Streptococcus pyogenes NOT DETECTED NOT DETECTED  ? A.calcoaceticus-baumannii NOT DETECTED NOT DETECTED  ? Bacteroides fragilis NOT DETECTED NOT DETECTED  ? Enterobacterales NOT DETECTED NOT DETECTED  ? Enterobacter cloacae complex NOT DETECTED NOT DETECTED  ? Escherichia coli NOT DETECTED NOT DETECTED  ? Klebsiella aerogenes NOT DETECTED NOT DETECTED  ? Klebsiella oxytoca NOT DETECTED NOT DETECTED  ? Klebsiella pneumoniae NOT DETECTED NOT DETECTED  ? Proteus species NOT DETECTED NOT DETECTED  ? Salmonella species NOT DETECTED NOT DETECTED  ? Serratia marcescens NOT DETECTED  NOT DETECTED  ? Haemophilus influenzae NOT DETECTED NOT DETECTED  ? Neisseria meningitidis NOT DETECTED NOT DETECTED  ? Pseudomonas aeruginosa NOT DETECTED NOT DETECTED  ? Stenotrophomonas maltophilia NOT DETECTED NOT DETECTED  ? Candida albicans NOT DETECTED NOT DETECTED  ? Candida auris NOT DETECTED NOT DETECTED  ? Candida glabrata NOT DETECTED NOT DETECTED  ? Candida krusei NOT DETECTED NOT DETECTED  ? Candida parapsilosis NOT DETECTED NOT DETECTED  ? Candida tropicalis NOT DETECTED NOT DETECTED  ? Cryptococcus neoformans/gattii NOT DETECTED NOT DETECTED  ? ? ?Souleymane Saiki D. Mina Marble, PharmD, BCPS, BCCCP ?11/01/2021, 6:37 PM ? ?

## 2021-11-02 ENCOUNTER — Inpatient Hospital Stay (HOSPITAL_COMMUNITY): Payer: Medicare Other

## 2021-11-02 DIAGNOSIS — M79604 Pain in right leg: Secondary | ICD-10-CM | POA: Diagnosis not present

## 2021-11-02 DIAGNOSIS — I10 Essential (primary) hypertension: Secondary | ICD-10-CM | POA: Diagnosis not present

## 2021-11-02 DIAGNOSIS — M79605 Pain in left leg: Secondary | ICD-10-CM

## 2021-11-02 DIAGNOSIS — I4891 Unspecified atrial fibrillation: Secondary | ICD-10-CM | POA: Diagnosis not present

## 2021-11-02 DIAGNOSIS — K219 Gastro-esophageal reflux disease without esophagitis: Secondary | ICD-10-CM | POA: Diagnosis not present

## 2021-11-02 DIAGNOSIS — E119 Type 2 diabetes mellitus without complications: Secondary | ICD-10-CM | POA: Diagnosis not present

## 2021-11-02 DIAGNOSIS — R7881 Bacteremia: Secondary | ICD-10-CM

## 2021-11-02 LAB — CBC
HCT: 35.1 % — ABNORMAL LOW (ref 36.0–46.0)
Hemoglobin: 11.3 g/dL — ABNORMAL LOW (ref 12.0–15.0)
MCH: 34.7 pg — ABNORMAL HIGH (ref 26.0–34.0)
MCHC: 32.2 g/dL (ref 30.0–36.0)
MCV: 107.7 fL — ABNORMAL HIGH (ref 80.0–100.0)
Platelets: 123 10*3/uL — ABNORMAL LOW (ref 150–400)
RBC: 3.26 MIL/uL — ABNORMAL LOW (ref 3.87–5.11)
RDW: 14.3 % (ref 11.5–15.5)
WBC: 7.4 10*3/uL (ref 4.0–10.5)
nRBC: 0 % (ref 0.0–0.2)

## 2021-11-02 LAB — GLUCOSE, CAPILLARY
Glucose-Capillary: 98 mg/dL (ref 70–99)
Glucose-Capillary: 129 mg/dL — ABNORMAL HIGH (ref 70–99)
Glucose-Capillary: 145 mg/dL — ABNORMAL HIGH (ref 70–99)
Glucose-Capillary: 100 mg/dL — ABNORMAL HIGH (ref 70–99)

## 2021-11-02 LAB — PROTIME-INR
INR: 1.5 — ABNORMAL HIGH (ref 0.8–1.2)
Prothrombin Time: 18.3 seconds — ABNORMAL HIGH (ref 11.4–15.2)

## 2021-11-02 LAB — COMPREHENSIVE METABOLIC PANEL
ALT: 19 U/L (ref 0–44)
AST: 28 U/L (ref 15–41)
Albumin: 2.1 g/dL — ABNORMAL LOW (ref 3.5–5.0)
Alkaline Phosphatase: 48 U/L (ref 38–126)
Anion gap: 6 (ref 5–15)
BUN: 16 mg/dL (ref 8–23)
CO2: 21 mmol/L — ABNORMAL LOW (ref 22–32)
Calcium: 8.2 mg/dL — ABNORMAL LOW (ref 8.9–10.3)
Chloride: 111 mmol/L (ref 98–111)
Creatinine, Ser: 0.74 mg/dL (ref 0.44–1.00)
GFR, Estimated: >60 mL/min (ref >60)
Glucose, Bld: 104 mg/dL — ABNORMAL HIGH (ref 70–99)
Potassium: 4.9 mmol/L (ref 3.5–5.1)
Sodium: 138 mmol/L (ref 135–145)
Total Bilirubin: 0.4 mg/dL (ref 0.3–1.2)
Total Protein: 4.6 g/dL — ABNORMAL LOW (ref 6.5–8.1)

## 2021-11-02 LAB — MAGNESIUM: Magnesium: 1.9 mg/dL (ref 1.7–2.4)

## 2021-11-02 LAB — TSH: TSH: 6.66 u[IU]/mL — ABNORMAL HIGH (ref 0.350–4.500)

## 2021-11-02 MED ORDER — APIXABAN 5 MG PO TABS
5.0000 mg | ORAL_TABLET | Freq: Two times a day (BID) | ORAL | Status: DC
  Administered 2021-11-02 – 2021-11-09 (×16): 5 mg via ORAL
  Filled 2021-11-02 (×16): qty 1

## 2021-11-02 MED ORDER — DIGOXIN 0.25 MG/ML IJ SOLN
0.2500 mg | Freq: Three times a day (TID) | INTRAMUSCULAR | Status: DC
  Filled 2021-11-02 (×2): qty 1

## 2021-11-02 MED ORDER — ATORVASTATIN CALCIUM 10 MG PO TABS
10.0000 mg | ORAL_TABLET | Freq: Every day | ORAL | Status: DC
  Administered 2021-11-02 – 2021-11-09 (×8): 10 mg via ORAL
  Filled 2021-11-02 (×8): qty 1

## 2021-11-02 NOTE — Plan of Care (Signed)
  Problem: Nutrition: Goal: Adequate nutrition will be maintained Outcome: Progressing   

## 2021-11-02 NOTE — Discharge Instructions (Signed)

## 2021-11-02 NOTE — Progress Notes (Signed)
Bilateral lower extremity venous duplex completed. ?Refer to "CV Proc" under chart review to view preliminary results. ? ?11/02/2021 11:10 AM ?Kelby Aline., MHA, RVT, RDCS, RDMS   ?

## 2021-11-02 NOTE — Progress Notes (Addendum)
?PROGRESS NOTE ? ?Sara Merritt HYQ:[id number] DOB: 11-15-1929 DOA: 10/31/2021 ?PCP: Marco Collie, MD ? ? LOS: 1 day  ? ?Brief narrative: ? ?Sara Merritt is a 88 y.o. female with medical history significant of breast cancer status post lumpectomy, hypertension, CKD, atrial fibrillation, hypothyroidism, diabetes mellitus with tachycardia presented to the hospital feeling unwell, fatigue and weakness with dizziness on standing.  EMS was called in and patient was noted to be tachycardic in the 200s with hypotension.    In the ED, patient had transient hypotension after receiving Cardizem drip so was started on amiodarone drip.  Initial labs showed a potassium of 3.2, INR at the 1.6 and lactate elevated at 2.2 and 2.4.  Chest x-ray showed cardiomegaly.  Patient also received 500 cc of fluid in the ED and 30 mEq of potassium and was admitted hospital for further evaluation and treatment.   ? ?During hospitalization, patient remained hypotensive and required multiple fluid boluses.  Blood culture was positive in 1 bottle for staph so vancomycin was initiated.  Patient was also given digoxin 0.25 mg x 3 with slight improvement in rate control. ? ?Assessment/Plan: ? ?Principal Problem: ?  Atrial fibrillation with RVR ([place]) ?Active Problems: ?  HTN (hypertension) ?  Hypothyroidism ?  Diabetes ([place]) ?  GERD (gastroesophageal reflux disease) ?  Hypokalemia ? ? ?Atrial fibrillation with RVR ?On presentation initial RVR up to 200s with hypotension.  Received IV fluid bolus.  On amiodarone drip since could not tolerate Cardizem.  Received 3 doses of digoxin.  Cardiology has seen the patient today and digoxin has been discontinued.  Continue warfarin..  2D echocardiogram showed LV ejection fraction of 55 to 60% with no regional wall motion abnormality with severely elevated pulmonary artery systolic pressure.  Lactate was elevated ? ?Gram-positive bacteremia.  Could be contaminant.  Because patient was  hypotensive and lactate was elevated patient received vancomycin yesterday. ? ?Lactic acidosis. Improved with IV fluids.  ? ?Hypokalemia ? Latest potassium of 4.9.  Magnesium of 1.9. ?  ?Hypothyroidism ?Continue Synthroid. ? ?Hypertension ?Currently hypotensive.  Received normal saline bolus.  On normal saline maintenance fluids at this time.   ? ?Diabetes mellitus type II ?Continue sliding scale insulin for now.  ? ?GERD ?Continue PPI. ? ?Hyperlipidemia ?Simvastatin has been changed to atorvastatin ? ?Mild left leg pain.  Ultrasound of the lower extremity has been ordered ? ?Hypomagnesemia. ?Improved after replacement.  Magnesium today is 1.9. ? ?DVT prophylaxis:  ?apixaban (ELIQUIS) tablet 5 mg  ? ? ?Code Status: Full code ? ?Family Communication:  ?Spoke with the patient's daughter again at bedside and updated her about the clinical condition of the patient. ? ?Status is: Inpatient ? ?The patient is  inpatient because: Atrial fibrillation with RVR, hypotension, cardiology evaluation ?  ?Consultants: ?Cardiology ? ?Procedures: ?None ? ?Anti-infectives: ? ?None ? ?Anti-infectives (From admission, onward)  ? ? Start     Dose/Rate Route Frequency Ordered Stop  ? 11/01/21 2030  ceFAZolin (ANCEF) IVPB 2g/100 mL premix       ? 2 g ?200 mL/hr over 30 Minutes Intravenous Every 8 hours 11/01/21 1947    ? 11/01/21 2000  ceFAZolin (ANCEF) IVPB 2g/100 mL premix  Status:  Discontinued       ? 2 g ?200 mL/hr over 30 Minutes Intravenous 2 times daily 11/01/21 1848 11/01/21 1947  ? ?  ? ? ?Subjective: ?Today, patient was seen and examined at bedside.  More alert awake and communicative.  Denies pain dizziness lightheadedness  or shortness of breath.  Patient's daughter at bedside.  Complains of mild leg pain. ? ?Objective: ?Vitals:  ? 11/01/21 2002 11/02/21 0339  ?BP: 98/63 95/62  ?Pulse: 100 (!) 109  ?Resp: (!) 23 20  ?Temp: 98.3 ?F (36.8 ?C) 98.4 ?F (36.9 ?C)  ?SpO2: 95% 96%  ? ? ?Intake/Output Summary (Last 24 hours) at  11/02/2021 1041 ?Last data filed at 11/02/2021 5366 ?Gross per 24 hour  ?Intake 3717.6 ml  ?Output 750 ml  ?Net 2967.6 ml  ? ? ?Filed Weights  ? 10/31/21 1700 10/31/21 2152 11/02/21 0339  ?Weight: 76.7 kg 75.5 kg 80.8 kg  ? ?Body mass index is 30.58 kg/m?.  ? ?Physical Exam: ?General: Obese built, not in obvious distress, communicative ?HENT:   No scleral pallor or icterus noted. Oral mucosa is moist.  ?Chest:  Clear breath sounds.  Diminished breath sounds bilaterally. No crackles or wheezes.  ?CVS: S1 &S2 heard. No murmur.  Irregular irregular. ?Abdomen: Soft, nontender, nondistended.  Bowel sounds are heard.   ?Extremities: No cyanosis, clubbing left leg tenderness on palpation, peripheral pulses are palpable. ?Psych: Alert, awake and oriented, normal mood ?CNS:  No cranial nerve deficits.  Power equal in all extremities.   ?Skin: Warm and dry.  No rashes noted. ? ?Data Review: I have personally reviewed the following laboratory data and studies, ? ?CBC: ?Recent Labs  ?Lab 10/31/21 ?1425 11/01/21 ?0428 11/02/21 ?4403  ?WBC 8.4 6.8 7.4  ?NEUTROABS 6.4  --   --   ?HGB 13.5 11.9* 11.3*  ?HCT 39.9 36.1 35.1*  ?MCV 103.9* 106.5* 107.7*  ?PLT 197 142* 123*  ? ? ?Basic Metabolic Panel: ?Recent Labs  ?Lab 10/31/21 ?1540 10/31/21 ?2142 11/01/21 ?0428 11/02/21 ?0244  ?NA 137  --  139 138  ?K 3.2*  --  3.9 4.9  ?CL 101  --  107 111  ?CO2 22  --  23 21*  ?GLUCOSE 127*  --  106* 104*  ?BUN 32*  --  27* 16  ?CREATININE 0.99  --  0.86 0.74  ?CALCIUM 8.9  --  8.4* 8.2*  ?MG  --  1.5*  --  1.9  ? ? ?Liver Function Tests: ?Recent Labs  ?Lab 11/02/21 ?4742  ?AST 28  ?ALT 19  ?ALKPHOS 48  ?BILITOT 0.4  ?PROT 4.6*  ?ALBUMIN 2.1*  ? ?No results for input(s): LIPASE, AMYLASE in the last 168 hours. ?No results for input(s): AMMONIA in the last 168 hours. ?Cardiac Enzymes: ?No results for input(s): CKTOTAL, CKMB, CKMBINDEX, TROPONINI in the last 168 hours. ?BNP (last 3 results) ?No results for input(s): BNP in the last 8760  hours. ? ?ProBNP (last 3 results) ?No results for input(s): PROBNP in the last 8760 hours. ? ?CBG: ?Recent Labs  ?Lab 11/01/21 ?0826 11/01/21 ?1140 11/01/21 ?1510 11/01/21 ?2154 11/02/21 ?0759  ?GLUCAP 115* 151* 94 117* 98  ? ? ?Recent Results (from the past 240 hour(s))  ?Culture, blood (Routine X 2) w Reflex to ID Panel     Status: Abnormal (Preliminary result)  ? Collection Time: 10/31/21  2:20 PM  ? Specimen: BLOOD  ?Result Value Ref Range Status  ? Specimen Description BLOOD RIGHT ANTECUBITAL  Final  ? Special Requests   Final  ?  BOTTLES DRAWN AEROBIC AND ANAEROBIC Blood Culture results may not be optimal due to an excessive volume of blood received in culture bottles  ? Culture  Setup Time   Final  ?  GRAM POSITIVE COCCI IN CLUSTERS ?BOTTLES DRAWN AEROBIC ONLY ?  CRITICAL RESULT CALLED TO, READ BACK BY AND VERIFIED WITH: PHARMD T DANG [id number] FCP ?  ? Culture (A)  Final  ?  STAPHYLOCOCCUS HAEMOLYTICUS ?THE SIGNIFICANCE OF ISOLATING THIS ORGANISM FROM A SINGLE SET OF BLOOD CULTURES WHEN MULTIPLE SETS ARE DRAWN IS UNCERTAIN. PLEASE NOTIFY THE MICROBIOLOGY DEPARTMENT WITHIN ONE WEEK IF SPECIATION AND SENSITIVITIES ARE REQUIRED. ?Performed at [facility], [place] [address], [place], [place] [address] ?  ? Report Status PENDING  Incomplete  ?Blood Culture ID Panel (Reflexed)     Status: Abnormal  ? Collection Time: 10/31/21  2:20 PM  ?Result Value Ref Range Status  ? Enterococcus faecalis NOT DETECTED NOT DETECTED Final  ? Enterococcus Faecium NOT DETECTED NOT DETECTED Final  ? Listeria monocytogenes NOT DETECTED NOT DETECTED Final  ? Staphylococcus species DETECTED (A) NOT DETECTED Final  ?  Comment: CRITICAL RESULT CALLED TO, READ BACK BY AND VERIFIED WITH: ?PHARMD T DANG [id number] FCP ?  ? Staphylococcus aureus (BCID) NOT DETECTED NOT DETECTED Final  ? Staphylococcus epidermidis NOT DETECTED NOT DETECTED Final  ? Staphylococcus lugdunensis NOT DETECTED NOT DETECTED Final  ? Streptococcus species NOT  DETECTED NOT DETECTED Final  ? Streptococcus agalactiae NOT DETECTED NOT DETECTED Final  ? Streptococcus pneumoniae NOT DETECTED NOT DETECTED Final  ? Streptococcus pyogenes NOT DETECTED NOT DETECTED Final  ? A.calcoaceticus-baumannii

## 2021-11-02 NOTE — Progress Notes (Signed)
ANTICOAGULATION CONSULT NOTE  ? ?Pharmacy Consult for warfarin ?Indication: atrial fibrillation ? ?No Known Allergies ? ?Patient Measurements: ?Height: '5\' 4"'$  (162.6 cm) ?Weight: 80.8 kg (178 lb 2.1 oz) ?IBW/kg (Calculated) : 54.7 ? ?Vital Signs: ?Temp: 98.4 ?F (36.9 ?C) (03/24 5456) ?Temp Source: Oral (03/24 2563) ?BP: 95/62 (03/24 0339) ?Pulse Rate: 109 (03/24 0339) ? ?Labs: ?Recent Labs  ?  10/31/21 ?1425 10/31/21 ?1540 11/01/21 ?0428 11/02/21 ?0244  ?HGB 13.5  --  11.9* 11.3*  ?HCT 39.9  --  36.1 35.1*  ?PLT 197  --  142* 123*  ?LABPROT  --  18.7* 16.2* 18.3*  ?INR  --  1.6* 1.3* 1.5*  ?CREATININE  --  0.99 0.86 0.74  ? ? ? ?Estimated Creatinine Clearance: 49 mL/min (by C-G formula based on SCr of 0.74 mg/dL). ? ? ?Medical History: ?Past Medical History:  ?Diagnosis Date  ? Atrial fibrillation ([place])   ? ?Infusions:  ? sodium chloride 125 mL/hr at 11/02/21 0342  ? amiodarone 30 mg/hr (11/01/21 2330)  ?  ceFAZolin (ANCEF) IV 2 g (11/02/21 0342)  ? ? ?Assessment: ?Patient presented from SNF with shortness of breath, dizziness, and weakness.Patient was found to be in afib RVR. Pharmacy consulted to dose warfarin for afib.Patient reports surgery one month prior where warfarin was held. After surgery, the SNF where the patient stays decreased her warfarin dose to 2.5 mg daily. Patient taking antibiotics prior to presentation.  ? ?Patient and family were in the room. They reported that the patient takes 2.5 mg every night at bedtime. They plan on speaking to their cardiologist soon to ask about transitioning to Eliquis. ? ?INR subtherapeutic at 1.6 on admission, now up to 1.3>1.5. Hemoglobin now stable in 11s. No bleeding issues noted.  ? ?After discussion with cardiology and patient/family will switch to eliquis for anticoagulation ? ?Plan:  ?Stop INR checks ?Apixaban '5mg'$  bid starting this morning ? ?Erin Hearing PharmD., BCPS ?Clinical Pharmacist ?11/02/2021 9:07 AM ? ?

## 2021-11-02 NOTE — Consult Note (Signed)
?Cardiology Consultation:  ? ?Patient ID: Sara Merritt ?MRN: [id number]; DOB: 1930/06/25 ? ?Admit date: 10/31/2021 ?Date of Consult: 11/02/2021 ? ?PCP:  Marco Collie, MD ?  ?[facility] Providers ?Cardiologist:  None      ? ? ?Patient Profile:  ? ?Sara Merritt is a 88 y.o. female with a hx of breast cancer status postlumpectomy, hypertension, CKD, atrial fibrillation, hypothyroidism, diabetes  who is being seen 11/02/2021 for the evaluation of afib rvr at the request of Dr. Louanne Belton. ? ?History of Present Illness:  ? ?Sara Merritt felt unwell for the past week and was seen for UA. Was noted to be dizzy and disoriented with standing. EKG showed afib RVR. Initial rates 200 bpm with hypotension.  ? ?Recent cholecystectomy with recent antibiotics as well. She has been referred to cardiology for consideration of transitioning from warfarin to an alternative [place].  ? ?In ED received cardizem drip with hypotension and was transitioned to amiodarone. ? ?Received fluids in ED. ? ?Echo shows elevated LVEDP and preserved EF.  ? ?At time of my exam daughter is at the bedside. Patient is sleeping but wakes up easy and says she is overall well. HR in 130s, BP hypotensive but patient appears clinically stable.  ? ? ?Past Medical History:  ?Diagnosis Date  ? Atrial fibrillation ([place])   ? ? ?Past Surgical History:  ?Procedure Laterality Date  ? CHOLECYSTECTOMY N/A 2022  ?  ? ?Home Medications:  ?Prior to Admission medications   ?Medication Sig Start Date End Date Taking? Authorizing Provider  ?acetaminophen (TYLENOL) 325 MG tablet Take 650 mg by mouth every 6 (six) hours as needed for mild pain or fever.   Yes [provider]  ?allopurinol (ZYLOPRIM) 100 MG tablet Take 200 mg by mouth daily.   Yes [provider]  ?calcium carbonate (OS-CAL) 1250 (500 Ca) MG chewable tablet Chew 1,250 mg by mouth daily.   Yes [provider]  ?CARTIA XT 240 MG 24 hr capsule Take 240 mg by mouth daily as  needed (HR >110). 05/19/20  Yes [provider]  ?furosemide (LASIX) 20 MG tablet Take 20 mg by mouth daily.   Yes [provider]  ?Infant Care Products (DERMACLOUD EX) Apply 1 application. topically in the morning, at noon, and at bedtime. To buttocks   Yes [provider]  ?levothyroxine (SYNTHROID) 75 MCG tablet Take 75 mcg by mouth daily.   Yes [provider]  ?meclizine (ANTIVERT) 12.5 MG tablet Take 12.5 mg by mouth 3 (three) times daily as needed for dizziness.   Yes [provider]  ?omeprazole (PRILOSEC) 20 MG capsule Take 20 mg by mouth daily.   Yes [provider]  ?Pollen Extracts (PROSTAT PO) Take 30 mLs by mouth in the morning and at bedtime.   Yes [provider]  ?potassium chloride (KLOR-CON M) 10 MEQ tablet Take 10 mEq by mouth daily.   Yes [provider]  ?simvastatin (ZOCOR) 20 MG tablet Take 20 mg by mouth at bedtime.   Yes [provider]  ?tamoxifen (NOLVADEX) 20 MG tablet TAKE 1 TABLET BY MOUTH  DAILY ?Patient not taking: Reported on 10/31/2021 10/15/21   Dayton Scrape A, NP  ?warfarin (COUMADIN) 2.5 MG tablet Take 2.5 mg by mouth at bedtime.   Yes [provider]  ? ? ?Inpatient Medications: ?Scheduled Meds: ? allopurinol  200 mg Oral Daily  ? calcium carbonate  1,250 mg Oral Daily  ? digoxin  0.25 mg Intravenous Q8H  ?  feeding supplement  1 Container Oral TID BM  ? insulin aspart  0-9 Units Subcutaneous TID WC  ? levothyroxine  75 mcg Oral Daily  ? magnesium oxide  400 mg Oral BID  ? pantoprazole  40 mg Oral Daily  ? potassium chloride  40 mEq Oral BID  ? simvastatin  20 mg Oral QHS  ? Warfarin - Pharmacist Dosing Inpatient   Does not apply S9373  ? ?Continuous Infusions: ? sodium chloride 125 mL/hr at 11/02/21 0342  ? amiodarone 30 mg/hr (11/01/21 2330)  ?  ceFAZolin (ANCEF) IV 2 g (11/02/21 0342)  ? ?PRN Meds: ?acetaminophen, ondansetron (ZOFRAN) IV ? ?Allergies:   No Known Allergies ? ?Social  History:   ?Social History  ? ?Socioeconomic History  ? Marital status: Widowed  ?  Spouse name: Not on file  ? Number of children: Not on file  ? Years of education: Not on file  ? Highest education level: Not on file  ?Occupational History  ? Not on file  ?Tobacco Use  ? Smoking status: Former  ? Smokeless tobacco: Never  ?Substance and Sexual Activity  ? Alcohol use: Not on file  ? Drug use: Not on file  ? Sexual activity: Not on file  ?Other Topics Concern  ? Not on file  ?Social History Narrative  ? Not on file  ? ?Social Determinants of Health  ? ?Financial Resource Strain: Not on file  ?Food Insecurity: Not on file  ?Transportation Needs: Not on file  ?Physical Activity: Not on file  ?Stress: Not on file  ?Social Connections: Not on file  ?Intimate Partner Violence: Not on file  ?  ?Family History:   ? ?Family History  ?Problem Relation Age of Onset  ? Stroke Mother   ? Heart disease Father   ?  ? ?ROS:  ?Please see the history of present illness.  ? ?All other ROS reviewed and negative.    ? ?Physical Exam/Data:  ? ?Vitals:  ? 11/01/21 1501 11/01/21 1530 11/01/21 2002 11/02/21 0339  ?BP: 94/70 (!) '87/56 98/63 95/62 '$  ?Pulse: (!) 133 (!) 128 100 (!) 109  ?Resp: 18 (!) 22 (!) 23 20  ?Temp: 97.8 ?F (36.6 ?C)  98.3 ?F (36.8 ?C) 98.4 ?F (36.9 ?C)  ?TempSrc: Oral  Oral Oral  ?SpO2: 97% 95% 95% 96%  ?Weight:    80.8 kg  ?Height:      ? ? ?Intake/Output Summary (Last 24 hours) at 11/02/2021 4287 ?Last data filed at 11/02/2021 0300 ?Gross per 24 hour  ?Intake 3717.6 ml  ?Output 450 ml  ?Net 3267.6 ml  ? ? ?  11/02/2021  ?  3:39 AM 10/31/2021  ?  9:52 PM 10/31/2021  ?  5:00 PM  ?Last 3 Weights  ?Weight (lbs) 178 lb 2.1 oz 166 lb 7.2 oz 169 lb  ?Weight (kg) 80.8 kg 75.5 kg 76.658 kg  ?   ?Body mass index is 30.58 kg/m?Marland Kitchen  ?Constitutional: No acute distress ?Eyes: sclera non-icteric, normal conjunctiva and lids ?ENMT: normal dentition, moist mucous membranes ?Cardiovascular: irregualr tachycardia no murmur. S1 and S2 normal.  No jugular venous distention.  ?Respiratory: clear to auscultation bilaterally ?GI : normal bowel sounds, soft and nontender. No distention.   ?MSK: extremities warm, well perfused. Trace bilateral edema.  ?NEURO: grossly nonfocal exam, moves all extremities. ?PSYCH: alert and oriented x 3, normal mood and affect.  ? ? ?EKG:  The EKG was personally reviewed and demonstrates:  Afib rvr rate 108, nonsp T wave  abl ?Telemetry:  Telemetry was personally reviewed and demonstrates:  afib rvr rates 100-130, very variable ? ?Relevant CV Studies: ?echo ? ?Laboratory Data: ? ?High Sensitivity Troponin:  No results for input(s): TROPONINIHS in the last 720 hours.   ?Chemistry ?Recent Labs  ?Lab 10/31/21 ?1540 10/31/21 ?2142 11/01/21 ?0428 11/02/21 ?0244  ?NA 137  --  139 138  ?K 3.2*  --  3.9 4.9  ?CL 101  --  107 111  ?CO2 22  --  23 21*  ?GLUCOSE 127*  --  106* 104*  ?BUN 32*  --  27* 16  ?CREATININE 0.99  --  0.86 0.74  ?CALCIUM 8.9  --  8.4* 8.2*  ?MG  --  1.5*  --  1.9  ?GFRNONAA 55*  --  >60 >60  ?ANIONGAP 14  --  9 6  ?  ?Recent Labs  ?Lab 11/02/21 ?9833  ?PROT 4.6*  ?ALBUMIN 2.1*  ?AST 28  ?ALT 19  ?ALKPHOS 48  ?BILITOT 0.4  ? ?Lipids No results for input(s): CHOL, TRIG, HDL, LABVLDL, LDLCALC, CHOLHDL in the last 168 hours.  ?Hematology ?Recent Labs  ?Lab 10/31/21 ?1425 11/01/21 ?0428 11/02/21 ?8250  ?WBC 8.4 6.8 7.4  ?RBC 3.84* 3.39* 3.26*  ?HGB 13.5 11.9* 11.3*  ?HCT 39.9 36.1 35.1*  ?MCV 103.9* 106.5* 107.7*  ?MCH 35.2* 35.1* 34.7*  ?MCHC 33.8 33.0 32.2  ?RDW 13.9 14.0 14.3  ?PLT 197 142* 123*  ? ?Thyroid No results for input(s): TSH, FREET4 in the last 168 hours.  ?BNPNo results for input(s): BNP, PROBNP in the last 168 hours.  ?DDimer No results for input(s): DDIMER in the last 168 hours. ? ? ?Radiology/Studies:  ?DG Chest 2 View ? ?Result Date: 10/31/2021 ?CLINICAL DATA:  Atrial fibrillation, tachycardia EXAM: CHEST - 2 VIEW COMPARISON:  04/18/2016 FINDINGS: Transverse diameter of heart is increased. There is  possible large fixed hiatal hernia. Lung fields are clear of any infiltrates or pulmonary edema. There is no significant pleural effusion or pneumothorax. Small linear densities in the right lower lung fields may

## 2021-11-02 NOTE — Progress Notes (Signed)
PT Cancellation Note ? ?Patient Details ?Name: Sara Merritt ?MRN: [id number] ?DOB: [birth year] ? ? ?Cancelled Treatment:    Reason Eval/Treat Not Completed: Medical issues which prohibited therapy.  Has DVT on preliminary results, hold PT for now. ? ? ?Ramond Dial ?11/02/2021, 11:50 AM ? ?Mee Hives, PT PhD ?Acute Rehab Dept. Number: [facility] [phone] and [place] [phone] ? ?

## 2021-11-02 NOTE — Progress Notes (Addendum)
? ?Progress Note ? ?Patient Name: Sara Merritt ?Date of Encounter: 11/02/2021 ? ?[facility] Cardiologist: Elouise Munroe, MD  ? ?Subjective  ? ?IV amiodarone and digoxin being used because of hypotension for atrial fibrillation.  Spoke with her daughter.  She is very integral in her care at home.  She seems to be laying flat very comfortably.  Heart rate when the nurses were working did jump up to 130s.  Her daughter also says that when she tries to eat things such as cereal she will take 2 bites and then spit it out.  This has been worked up before. ? ?Inpatient Medications  ?  ?Scheduled Meds: ? allopurinol  200 mg Oral Daily  ? calcium carbonate  1,250 mg Oral Daily  ? digoxin  0.25 mg Intravenous Q8H  ? feeding supplement  1 Container Oral TID BM  ? insulin aspart  0-9 Units Subcutaneous TID WC  ? levothyroxine  75 mcg Oral Daily  ? magnesium oxide  400 mg Oral BID  ? pantoprazole  40 mg Oral Daily  ? potassium chloride  40 mEq Oral BID  ? simvastatin  20 mg Oral QHS  ? Warfarin - Pharmacist Dosing Inpatient   Does not apply D2202  ? ?Continuous Infusions: ? sodium chloride 125 mL/hr at 11/02/21 0342  ? amiodarone 30 mg/hr (11/01/21 2330)  ?  ceFAZolin (ANCEF) IV 2 g (11/02/21 0342)  ? ?PRN Meds: ?acetaminophen, ondansetron (ZOFRAN) IV  ? ?Vital Signs  ?  ?Vitals:  ? 11/01/21 1501 11/01/21 1530 11/01/21 2002 11/02/21 0339  ?BP: 94/70 (!) '87/56 98/63 95/62 '$  ?Pulse: (!) 133 (!) 128 100 (!) 109  ?Resp: 18 (!) 22 (!) 23 20  ?Temp: 97.8 ?F (36.6 ?C)  98.3 ?F (36.8 ?C) 98.4 ?F (36.9 ?C)  ?TempSrc: Oral  Oral Oral  ?SpO2: 97% 95% 95% 96%  ?Weight:    80.8 kg  ?Height:      ? ? ?Intake/Output Summary (Last 24 hours) at 11/02/2021 0834 ?Last data filed at 11/02/2021 0300 ?Gross per 24 hour  ?Intake 3717.6 ml  ?Output 450 ml  ?Net 3267.6 ml  ? ? ?  11/02/2021  ?  3:39 AM 10/31/2021  ?  9:52 PM 10/31/2021  ?  5:00 PM  ?Last 3 Weights  ?Weight (lbs) 178 lb 2.1 oz 166 lb 7.2 oz 169 lb  ?Weight (kg) 80.8 kg 75.5  kg 76.658 kg  ?   ? ?Telemetry  ?  ?Heart rates currently around 110 bpm, improved.  Atrial fibrillation.- Personally Reviewed ? ?ECG  ?  ?No new ECG tracing today, previously atrial fibrillation with rapid ventricular response. - Personally Reviewed ? ?Physical Exam  ? ?GEN: No acute distress.  Elderly ?Neck: No JVD ?Cardiac: Irregularly irregular tachycardic, no murmurs, rubs, or gallops.  ?Respiratory: Clear to auscultation bilaterally. ?GI: Soft, nontender, non-distended  ?MS: No edema; No deformity. ?Neuro:  Nonfocal  ?Psych: Normal affect  ? ?Labs  ?  ?High Sensitivity Troponin:  No results for input(s): TROPONINIHS in the last 720 hours.   ?Chemistry ?Recent Labs  ?Lab 10/31/21 ?1540 10/31/21 ?2142 11/01/21 ?0428 11/02/21 ?0244  ?NA 137  --  139 138  ?K 3.2*  --  3.9 4.9  ?CL 101  --  107 111  ?CO2 22  --  23 21*  ?GLUCOSE 127*  --  106* 104*  ?BUN 32*  --  27* 16  ?CREATININE 0.99  --  0.86 0.74  ?CALCIUM 8.9  --  8.4* 8.2*  ?MG  --  1.5*  --  1.9  ?PROT  --   --   --  4.6*  ?ALBUMIN  --   --   --  2.1*  ?AST  --   --   --  28  ?ALT  --   --   --  19  ?ALKPHOS  --   --   --  48  ?BILITOT  --   --   --  0.4  ?GFRNONAA 55*  --  >60 >60  ?ANIONGAP 14  --  9 6  ?  ?Lipids No results for input(s): CHOL, TRIG, HDL, LABVLDL, LDLCALC, CHOLHDL in the last 168 hours.  ?Hematology ?Recent Labs  ?Lab 10/31/21 ?1425 11/01/21 ?0428 11/02/21 ?4332  ?WBC 8.4 6.8 7.4  ?RBC 3.84* 3.39* 3.26*  ?HGB 13.5 11.9* 11.3*  ?HCT 39.9 36.1 35.1*  ?MCV 103.9* 106.5* 107.7*  ?MCH 35.2* 35.1* 34.7*  ?MCHC 33.8 33.0 32.2  ?RDW 13.9 14.0 14.3  ?PLT 197 142* 123*  ? ?Thyroid No results for input(s): TSH, FREET4 in the last 168 hours.  ?BNPNo results for input(s): BNP, PROBNP in the last 168 hours.  ?DDimer No results for input(s): DDIMER in the last 168 hours.  ? ?Radiology  ?  ?DG Chest 2 View ? ?Result Date: 10/31/2021 ?CLINICAL DATA:  Atrial fibrillation, tachycardia EXAM: CHEST - 2 VIEW COMPARISON:  04/18/2016 FINDINGS: Transverse  diameter of heart is increased. There is possible large fixed hiatal hernia. Lung fields are clear of any infiltrates or pulmonary edema. There is no significant pleural effusion or pneumothorax. Small linear densities in the right lower lung fields may suggest subsegmental atelectasis. Degenerative changes are noted in the left shoulder. IMPRESSION: Cardiomegaly. There are no signs of pulmonary edema or focal pulmonary consolidation. Linear densities in the right lower lung fields suggest subsegmental atelectasis. Possible large fixed hiatal hernia. Electronically Signed   By: Elmer Picker M.D.   On: 10/31/2021 14:53  ? ?ECHOCARDIOGRAM COMPLETE ? ?Result Date: 11/01/2021 ?   ECHOCARDIOGRAM REPORT   Patient Name:   Sara Merritt Date of Exam: 11/01/2021 Medical Rec #:  [id number]               Height:       64.0 in Accession #:    [id number]              Weight:       166.4 lb Date of Birth:  08-09-1930               BSA:          1.809 m? Patient Age:    88 years                BP:           107/73 mmHg Patient Gender: F                       HR:           119 bpm. Exam Location:  Inpatient Procedure: 2D Echo, Cardiac Doppler and Color Doppler Indications:    Afib  History:        Patient has no prior history of Echocardiogram examinations.                 Risk Factors:Hypertension and Diabetes.  Sonographer:    Jyl Heinz Referring Phys: [id number] [place]  1. Left ventricular ejection fraction, by  estimation, is 55 to 60%. The left ventricle has normal function. The left ventricle has no regional wall motion abnormalities. Left ventricular diastolic parameters are indeterminate. Elevated left ventricular end-diastolic pressure.  2. Right ventricular systolic function is normal. The right ventricular size is moderately enlarged. There is severely elevated pulmonary artery systolic pressure.  3. Left atrial size was severely dilated.  4. Right atrial size was severely dilated.  5.  The mitral valve is normal in structure. Trivial mitral valve regurgitation. No evidence of mitral stenosis. Moderate mitral annular calcification.  6. Tricuspid valve regurgitation is moderate.  7. The aortic valve is calcified. There is moderate calcification of the aortic valve. There is moderate thickening of the aortic valve. Aortic valve regurgitation is trivial. Moderate aortic valve stenosis.  8. The inferior vena cava is normal in size with greater than 50% respiratory variability, suggesting right atrial pressure of 3 mmHg. FINDINGS  Left Ventricle: Left ventricular ejection fraction, by estimation, is 55 to 60%. The left ventricle has normal function. The left ventricle has no regional wall motion abnormalities. The left ventricular internal cavity size was normal in size. There is  no left ventricular hypertrophy. Left ventricular diastolic function could not be evaluated due to atrial fibrillation. Left ventricular diastolic parameters are indeterminate. Elevated left ventricular end-diastolic pressure. Right Ventricle: The right ventricular size is moderately enlarged. No increase in right ventricular wall thickness. Right ventricular systolic function is normal. There is severely elevated pulmonary artery systolic pressure. The tricuspid regurgitant velocity is 4.61 m/s, and with an assumed right atrial pressure of 3 mmHg, the estimated right ventricular systolic pressure is 81.1 mmHg. Left Atrium: Left atrial size was severely dilated. Right Atrium: Right atrial size was severely dilated. Pericardium: There is no evidence of pericardial effusion. Mitral Valve: The mitral valve is normal in structure. Moderate mitral annular calcification. Trivial mitral valve regurgitation. No evidence of mitral valve stenosis. Tricuspid Valve: The tricuspid valve is normal in structure. Tricuspid valve regurgitation is moderate . No evidence of tricuspid stenosis. Aortic Valve: The aortic valve is calcified. There  is moderate calcification of the aortic valve. There is moderate thickening of the aortic valve. Aortic valve regurgitation is trivial. Aortic regurgitation PHT measures 388 msec. Moderate aortic stenosis is

## 2021-11-02 NOTE — Progress Notes (Signed)
PT Cancellation Note ? ?Patient Details ?Name: Sara Merritt ?MRN: [id number] ?DOB: August 27, 1929 ? ? ?Cancelled Treatment:    Reason Eval/Treat Not Completed: Patient declined, no reason specified.  Pt was attempted with permission from MD and nursing but at the last minute she declined PT.  Follow up with her at another time. ? ? ?Ramond Dial ?11/02/2021, 2:26 PM ? ?Mee Hives, PT PhD ?Acute Rehab Dept. Number: [facility] [phone] and [place] [phone] ? ?

## 2021-11-03 ENCOUNTER — Inpatient Hospital Stay (HOSPITAL_COMMUNITY): Payer: Medicare Other

## 2021-11-03 ENCOUNTER — Inpatient Hospital Stay: Payer: Self-pay

## 2021-11-03 DIAGNOSIS — I824Y3 Acute embolism and thrombosis of unspecified deep veins of proximal lower extremity, bilateral: Secondary | ICD-10-CM | POA: Diagnosis not present

## 2021-11-03 DIAGNOSIS — I4891 Unspecified atrial fibrillation: Secondary | ICD-10-CM | POA: Diagnosis not present

## 2021-11-03 DIAGNOSIS — R7881 Bacteremia: Secondary | ICD-10-CM | POA: Diagnosis not present

## 2021-11-03 DIAGNOSIS — I824Y9 Acute embolism and thrombosis of unspecified deep veins of unspecified proximal lower extremity: Secondary | ICD-10-CM

## 2021-11-03 DIAGNOSIS — E119 Type 2 diabetes mellitus without complications: Secondary | ICD-10-CM | POA: Diagnosis not present

## 2021-11-03 LAB — GLUCOSE, CAPILLARY
Glucose-Capillary: 133 mg/dL — ABNORMAL HIGH (ref 70–99)
Glucose-Capillary: 110 mg/dL — ABNORMAL HIGH (ref 70–99)
Glucose-Capillary: 109 mg/dL — ABNORMAL HIGH (ref 70–99)
Glucose-Capillary: 116 mg/dL — ABNORMAL HIGH (ref 70–99)

## 2021-11-03 LAB — CULTURE, BLOOD (ROUTINE X 2)

## 2021-11-03 LAB — BASIC METABOLIC PANEL
Anion gap: 5 (ref 5–15)
BUN: 7 mg/dL — ABNORMAL LOW (ref 8–23)
CO2: 21 mmol/L — ABNORMAL LOW (ref 22–32)
Calcium: 8.1 mg/dL — ABNORMAL LOW (ref 8.9–10.3)
Chloride: 110 mmol/L (ref 98–111)
Creatinine, Ser: 0.73 mg/dL (ref 0.44–1.00)
GFR, Estimated: >60 mL/min (ref >60)
Glucose, Bld: 123 mg/dL — ABNORMAL HIGH (ref 70–99)
Potassium: 4.3 mmol/L (ref 3.5–5.1)
Sodium: 136 mmol/L (ref 135–145)

## 2021-11-03 LAB — T4, FREE: Free T4: 1.35 ng/dL — ABNORMAL HIGH (ref 0.61–1.12)

## 2021-11-03 LAB — TSH: TSH: 6.793 u[IU]/mL — ABNORMAL HIGH (ref 0.350–4.500)

## 2021-11-03 MED ORDER — SODIUM CHLORIDE 0.9% FLUSH
10.0000 mL | Freq: Two times a day (BID) | INTRAVENOUS | Status: DC
  Administered 2021-11-03 – 2021-11-05 (×5): 10 mL
  Administered 2021-11-05: 20 mL
  Administered 2021-11-06 – 2021-11-07 (×3): 10 mL
  Administered 2021-11-07: 25 mL
  Administered 2021-11-08 (×2): 10 mL

## 2021-11-03 MED ORDER — CHLORHEXIDINE GLUCONATE CLOTH 2 % EX PADS
6.0000 | MEDICATED_PAD | Freq: Every day | CUTANEOUS | Status: DC
  Administered 2021-11-03 – 2021-11-09 (×8): 6 via TOPICAL

## 2021-11-03 MED ORDER — METOPROLOL TARTRATE 25 MG PO TABS
25.0000 mg | ORAL_TABLET | Freq: Two times a day (BID) | ORAL | Status: DC
Start: 2021-11-03 — End: 2021-11-04
  Administered 2021-11-03: 25 mg via ORAL
  Filled 2021-11-03: qty 1

## 2021-11-03 MED ORDER — SODIUM CHLORIDE 0.9% FLUSH: 10.0000 mL | INTRAVENOUS | Status: DC | PRN

## 2021-11-03 MED ORDER — METOPROLOL TARTRATE 12.5 MG HALF TABLET
12.5000 mg | ORAL_TABLET | Freq: Two times a day (BID) | ORAL | Status: DC
  Administered 2021-11-03: 12.5 mg via ORAL
  Filled 2021-11-03: qty 1

## 2021-11-03 MED ORDER — METOPROLOL TARTRATE 25 MG PO TABS: 25.0000 mg | ORAL_TABLET | Freq: Two times a day (BID) | ORAL | Status: DC

## 2021-11-03 NOTE — Progress Notes (Addendum)
? ?Progress Note ? ?Patient Name: Sara Merritt ?Date of Encounter: 11/03/2021 ? ?[facility] Cardiologist: Elouise Munroe, MD  ? ?Subjective  ? ?BP 101/66 this morning.  Denies chest pain or dyspnea ? ?Inpatient Medications  ?  ?Scheduled Meds: ? allopurinol  200 mg Oral Daily  ? apixaban  5 mg Oral BID  ? atorvastatin  10 mg Oral Daily  ? calcium carbonate  1,250 mg Oral Daily  ? feeding supplement  1 Container Oral TID BM  ? insulin aspart  0-9 Units Subcutaneous TID WC  ? levothyroxine  75 mcg Oral Daily  ? magnesium oxide  400 mg Oral BID  ? pantoprazole  40 mg Oral Daily  ? ?Continuous Infusions: ? amiodarone 30 mg/hr (11/02/21 2328)  ?  ceFAZolin (ANCEF) IV 2 g (11/03/21 0436)  ? ?PRN Meds: ?acetaminophen, ondansetron (ZOFRAN) IV  ? ?Vital Signs  ?  ?Vitals:  ? 11/02/21 2011 11/03/21 0136 11/03/21 0500 11/03/21 2423  ?BP: 100/73 (!) 114/53  101/66  ?Pulse: (!) 107 96  (!) 102  ?Resp: 16 (!) 22  18  ?Temp: 97.7 ?F (36.5 ?C) 98.1 ?F (36.7 ?C)  98 ?F (36.7 ?C)  ?TempSrc: Oral Oral  Oral  ?SpO2: 98% 96%  97%  ?Weight:   81.1 kg   ?Height:      ? ? ?Intake/Output Summary (Last 24 hours) at 11/03/2021 0716 ?Last data filed at 11/03/2021 0600 ?Gross per 24 hour  ?Intake 731.7 ml  ?Output 300 ml  ?Net 431.7 ml  ? ? ? ?  11/03/2021  ?  5:00 AM 11/02/2021  ?  3:39 AM 10/31/2021  ?  9:52 PM  ?Last 3 Weights  ?Weight (lbs) 178 lb 12.7 oz 178 lb 2.1 oz 166 lb 7.2 oz  ?Weight (kg) 81.1 kg 80.8 kg 75.5 kg  ?   ? ?Telemetry  ?  ?Heart rates currently around 110 bpm, improved.  Atrial fibrillation.- Personally Reviewed ? ?ECG  ?  ?No new ECG tracing today, previously atrial fibrillation with rapid ventricular response. - Personally Reviewed ? ?Physical Exam  ? ?GEN: No acute distress.  Elderly ?Neck: No JVD ?Cardiac: Irregularly irregular tachycardic, no murmurs, rubs, or gallops.  ?Respiratory: Clear to auscultation bilaterally. ?GI: Soft, nontender, non-distended  ?MS: No edema; No deformity. ?Neuro:  Nonfocal   ?Psych: Normal affect  ? ?Labs  ?  ?High Sensitivity Troponin:  No results for input(s): TROPONINIHS in the last 720 hours.   ?Chemistry ?Recent Labs  ?Lab 10/31/21 ?1540 10/31/21 ?2142 11/01/21 ?0428 11/02/21 ?0244  ?NA 137  --  139 138  ?K 3.2*  --  3.9 4.9  ?CL 101  --  107 111  ?CO2 22  --  23 21*  ?GLUCOSE 127*  --  106* 104*  ?BUN 32*  --  27* 16  ?CREATININE 0.99  --  0.86 0.74  ?CALCIUM 8.9  --  8.4* 8.2*  ?MG  --  1.5*  --  1.9  ?PROT  --   --   --  4.6*  ?ALBUMIN  --   --   --  2.1*  ?AST  --   --   --  28  ?ALT  --   --   --  19  ?ALKPHOS  --   --   --  48  ?BILITOT  --   --   --  0.4  ?GFRNONAA 55*  --  >60 >60  ?ANIONGAP 14  --  9 6  ? ?  ?  Lipids No results for input(s): CHOL, TRIG, HDL, LABVLDL, LDLCALC, CHOLHDL in the last 168 hours.  ?Hematology ?Recent Labs  ?Lab 10/31/21 ?1425 11/01/21 ?0428 11/02/21 ?8101  ?WBC 8.4 6.8 7.4  ?RBC 3.84* 3.39* 3.26*  ?HGB 13.5 11.9* 11.3*  ?HCT 39.9 36.1 35.1*  ?MCV 103.9* 106.5* 107.7*  ?MCH 35.2* 35.1* 34.7*  ?MCHC 33.8 33.0 32.2  ?RDW 13.9 14.0 14.3  ?PLT 197 142* 123*  ? ? ?Thyroid  ?Recent Labs  ?Lab 11/02/21 ?0945  ?TSH 6.660*  ?  ?BNPNo results for input(s): BNP, PROBNP in the last 168 hours.  ?DDimer No results for input(s): DDIMER in the last 168 hours.  ? ?Radiology  ?  ?ECHOCARDIOGRAM COMPLETE ? ?Result Date: 11/01/2021 ?   ECHOCARDIOGRAM REPORT   Patient Name:   Sara Merritt Date of Exam: 11/01/2021 Medical Rec #:  [id number]               Height:       64.0 in Accession #:    [id number]              Weight:       166.4 lb Date of Birth:  10-Mar-1930               BSA:          1.809 m? Patient Age:    88 years                BP:           107/73 mmHg Patient Gender: F                       HR:           119 bpm. Exam Location:  Inpatient Procedure: 2D Echo, Cardiac Doppler and Color Doppler Indications:    Afib  History:        Patient has no prior history of Echocardiogram examinations.                 Risk Factors:Hypertension and Diabetes.   Sonographer:    Jyl Heinz Referring Phys: [id number] Rentz  1. Left ventricular ejection fraction, by estimation, is 55 to 60%. The left ventricle has normal function. The left ventricle has no regional wall motion abnormalities. Left ventricular diastolic parameters are indeterminate. Elevated left ventricular end-diastolic pressure.  2. Right ventricular systolic function is normal. The right ventricular size is moderately enlarged. There is severely elevated pulmonary artery systolic pressure.  3. Left atrial size was severely dilated.  4. Right atrial size was severely dilated.  5. The mitral valve is normal in structure. Trivial mitral valve regurgitation. No evidence of mitral stenosis. Moderate mitral annular calcification.  6. Tricuspid valve regurgitation is moderate.  7. The aortic valve is calcified. There is moderate calcification of the aortic valve. There is moderate thickening of the aortic valve. Aortic valve regurgitation is trivial. Moderate aortic valve stenosis.  8. The inferior vena cava is normal in size with greater than 50% respiratory variability, suggesting right atrial pressure of 3 mmHg. FINDINGS  Left Ventricle: Left ventricular ejection fraction, by estimation, is 55 to 60%. The left ventricle has normal function. The left ventricle has no regional wall motion abnormalities. The left ventricular internal cavity size was normal in size. There is  no left ventricular hypertrophy. Left ventricular diastolic function could not be evaluated due to atrial fibrillation. Left ventricular diastolic parameters are indeterminate. Elevated left ventricular  end-diastolic pressure. Right Ventricle: The right ventricular size is moderately enlarged. No increase in right ventricular wall thickness. Right ventricular systolic function is normal. There is severely elevated pulmonary artery systolic pressure. The tricuspid regurgitant velocity is 4.61 m/s, and with an assumed  right atrial pressure of 3 mmHg, the estimated right ventricular systolic pressure is 62.9 mmHg. Left Atrium: Left atrial size was severely dilated. Right Atrium: Right atrial size was severely dilated. Pericardium: There is no evidence of pericardial effusion. Mitral Valve: The mitral valve is normal in structure. Moderate mitral annular calcification. Trivial mitral valve regurgitation. No evidence of mitral valve stenosis. Tricuspid Valve: The tricuspid valve is normal in structure. Tricuspid valve regurgitation is moderate . No evidence of tricuspid stenosis. Aortic Valve: The aortic valve is calcified. There is moderate calcification of the aortic valve. There is moderate thickening of the aortic valve. Aortic valve regurgitation is trivial. Aortic regurgitation PHT measures 388 msec. Moderate aortic stenosis is present. Aortic valve mean gradient measures 16.5 mmHg. Aortic valve peak gradient measures 26.7 mmHg. Aortic valve area, by VTI measures 0.95 cm?. Pulmonic Valve: The pulmonic valve was normal in structure. Pulmonic valve regurgitation is not visualized. No evidence of pulmonic stenosis. Aorta: The aortic root is normal in size and structure. Venous: The inferior vena cava is normal in size with greater than 50% respiratory variability, suggesting right atrial pressure of 3 mmHg. IAS/Shunts: No atrial level shunt detected by color flow Doppler.  LEFT VENTRICLE PLAX 2D LVIDd:         4.00 cm     Diastology LVIDs:         2.70 cm     LV e' medial:    5.66 cm/s LV PW:         1.10 cm     LV E/e' medial:  21.9 LV IVS:        1.00 cm     LV e' lateral:   7.07 cm/s LVOT diam:     2.00 cm     LV E/e' lateral: 17.5 LV SV:         46 LV SV Index:   26 LVOT Area:     3.14 cm?  LV Volumes (MOD) LV vol d, MOD A2C: 49.5 ml LV vol d, MOD A4C: 44.3 ml LV vol s, MOD A2C: 24.2 ml LV vol s, MOD A4C: 21.5 ml LV SV MOD A2C:     25.3 ml LV SV MOD A4C:     44.3 ml LV SV MOD BP:      24.8 ml RIGHT VENTRICLE             IVC  RV Basal diam:  4.30 cm     IVC diam: 1.40 cm RV Mid diam:    3.30 cm RV S prime:     14.60 cm/s TAPSE (M-mode): 1.6 cm LEFT ATRIUM             Index        RIGHT ATRIUM           Index LA diam:        3.70 cm

## 2021-11-03 NOTE — Progress Notes (Signed)
Spoke with Dr Autumn Patty re PICC placement per CXR.  States will make addendum to states PICC placement is appropriate. ?

## 2021-11-03 NOTE — Evaluation (Signed)
Physical Therapy Evaluation ?Patient Details ?Name: Sara Merritt ?MRN: [id number] ?DOB: 1930/01/06 ?Today's Date: 11/03/2021 ? ?History of Present Illness ? Sara Merritt is an 87 y.o. female  presented to the hospital feeling unwell, fatigue and weakness with dizziness on standing.  EMS was called in and patient was noted to be tachycardic in the 200s with hypotension.    In the ED, patient had transient hypotension after receiving Cardizem drip so was started on amiodarone drip. PMH:  breast cancer status post lumpectomy, hypertension, CKD, atrial fibrillation, hypothyroidism, diabetes mellitus with tachycardia  ?Clinical Impression ? Pt admitted with above diagnosis. Pt was able to sit EOB for 10 min with min assist to min guard and cues.  BP dropped but not orthostatic (134/105- taken in LE as Ues are sore).  HR did elevate to 125-155 bpm with afib with sitting.  Pt overall tolerated session well.  Daughter is hopeful that pt can return to A living if medications can get HR and BP under control.  Will continue PT.   Pt currently with functional limitations due to the deficits listed below (see PT Problem List). Pt will benefit from skilled PT to increase their independence and safety with mobility to allow discharge to the venue listed below.      ?   ? ?Recommendations for follow up therapy are one component of a multi-disciplinary discharge planning process, led by the attending physician.  Recommendations may be updated based on patient status, additional functional criteria and insurance authorization. ? ?Follow Up Recommendations Home health PT (Family would like pt to return to A living she was at) ? ?  ?Assistance Recommended at Discharge Frequent or constant Supervision/Assistance  ?Patient can return home with the following ? A lot of help with walking and/or transfers;A lot of help with bathing/dressing/bathroom;Help with stairs or ramp for entrance;Assist for  transportation;Assistance with cooking/housework;Assistance with feeding;Direct supervision/assist for medications management;Direct supervision/assist for financial management ? ?  ?Equipment Recommendations None recommended by PT  ?Recommendations for Other Services ?    ?  ?Functional Status Assessment Patient has had a recent decline in their functional status and demonstrates the ability to make significant improvements in function in a reasonable and predictable amount of time.  ? ?  ?Precautions / Restrictions Precautions ?Precautions: Fall ?Restrictions ?Weight Bearing Restrictions: No  ? ?  ? ?Mobility ? Bed Mobility ?Overal bed mobility: Needs Assistance ?Bed Mobility: Supine to Sit, Rolling ?Rolling: Mod assist ?  ?Supine to sit: +2 for physical assistance, HOB elevated, Mod assist ?  ?  ?General bed mobility comments: Pt able to initiate LEs to side of bed but needed assist to elevate trunk. ?  ? ?Transfers ?  ?  ?  ?  ?  ?  ?  ?  ?  ?General transfer comment: unable today ?  ? ?Ambulation/Gait ?  ?  ?  ?  ?  ?  ?  ?General Gait Details: unable ? ?Stairs ?  ?  ?  ?  ?  ? ?Wheelchair Mobility ?  ? ?Modified Rankin (Stroke Patients Only) ?  ? ?  ? ?Balance Overall balance assessment: Needs assistance ?Sitting-balance support: Bilateral upper extremity supported, Feet supported, No upper extremity supported ?Sitting balance-Leahy Scale: Poor ?Sitting balance - Comments: Pt min to min guard assist for balance with pt having periods of sitting upright and periods with posterior lean. Relies on UE support. Sat a total of 10 min. ?Postural control: Posterior lean ?  ?  ?  ?  ?  ?  ?  ?  ?  ?  ?  ?  ?  ?  ?   ? ? ? ?  Pertinent Vitals/Pain Pain Assessment ?Pain Assessment: Faces ?Faces Pain Scale: Hurts whole lot ?Pain Location: Bil UE and LEs ?Pain Descriptors / Indicators: Grimacing, Guarding, Aching, Sharp ?Pain Intervention(s): Limited activity within patient's tolerance, Monitored during session,  Repositioned, Patient requesting pain meds-RN notified  ? ? ?Home Living Family/patient expects to be discharged to:: Assisted living ?  ?  ?  ?  ?  ?  ?  ?  ?  ?Additional Comments: In Feb, fell and broke 3 rivs, gallbladder removed and afib began; Went to [facility] and had BP issues there; Was walking with walker short distances there for a bit;  First of March pt moved to [facility] and pt was getting pt up as well and still had lightheadedness etc.Passed out on toilet at A living prior to this admit.  ?  ?Prior Function   ?  ?  ?  ?  ?  ?  ?Mobility Comments: In Feb was walking on her own with RW at home and rollator when she went up. ?ADLs Comments: B/D self prior to FEb ?  ? ? ?Hand Dominance  ?   ? ?  ?Extremity/Trunk Assessment  ? Upper Extremity Assessment ?Upper Extremity Assessment: Defer to OT evaluation ?  ? ?Lower Extremity Assessment ?Lower Extremity Assessment: RLE deficits/detail;LLE deficits/detail ?RLE Deficits / Details: grossly3-/5 ?LLE Deficits / Details: grossly 3-/5 ?  ? ?Cervical / Trunk Assessment ?Cervical / Trunk Assessment: Kyphotic  ?Communication  ? Communication: No difficulties  ?Cognition Arousal/Alertness: Awake/alert ?Behavior During Therapy: [facility] for tasks assessed/performed ?Overall Cognitive Status: Within Functional Limits for tasks assessed ?  ?  ?  ?  ?  ?  ?  ?  ?  ?  ?  ?  ?  ?  ?  ?  ?  ?  ?  ? ?  ?General Comments General comments (skin integrity, edema, etc.): 113- 128 bpm, 96% RA, supine 145/100 (on LE ) at rest; Once sitting, HR to 155 bpm and BP measured at 134/105.  Once pt back in bed, BP 111/95 with HR 122 bpm. Nurse aware. ? ?  ?Exercises General Exercises - Lower Extremity ?Ankle Circles/Pumps: AROM, Both, 5 reps, Supine ?Long Arc Quad: AROM, Both, 5 reps, Seated ?Heel Slides: AROM, Both, 10 reps, Supine ?Straight Leg Raises: AROM, Both, 5 reps, Supine  ? ?Assessment/Plan  ?  ?PT Assessment Patient needs continued PT services  ?PT Problem List  Decreased activity tolerance;Decreased balance;Decreased mobility;Decreased knowledge of use of DME;Decreased safety awareness;Decreased knowledge of precautions;Cardiopulmonary status limiting activity;Decreased strength;Pain ? ?   ?  ?PT Treatment Interventions Gait training;DME instruction;Functional mobility training;Therapeutic activities;Therapeutic exercise;Balance training;Patient/family education;Wheelchair mobility training   ? ?PT Goals (Current goals can be found in the Care Plan section)  ?Acute Rehab PT Goals ?Patient Stated Goal: to go to A living ?PT Goal Formulation: With patient ?Time For Goal Achievement: 11/17/21 ?Potential to Achieve Goals: Good ? ?  ?Frequency Min 3X/week ?  ? ? ?Co-evaluation   ?  ?  ?  ?  ? ? ?  ?AM-PAC PT "6 Clicks" Mobility  ?Outcome Measure Help needed turning from your back to your side while in a flat bed without using bedrails?: A Lot ?Help needed moving from lying on your back to sitting on the side of a flat bed without using bedrails?: Total ?Help needed moving to and from a bed to a chair (including a wheelchair)?: Total ?Help needed standing up from a chair using your arms (e.g.,  wheelchair or bedside chair)?: Total ?Help needed to walk in hospital room?: Total ?Help needed climbing 3-5 steps with a railing? : Total ?6 Click Score: 7 ? ?  ?End of Session Equipment Utilized During Treatment: Gait belt ?Activity Tolerance: Patient limited by fatigue (incr HR and decr BP) ?Patient left: in bed;with call bell/phone within reach;with bed alarm set;with family/visitor present;with nursing/sitter in room ?Nurse Communication: Mobility status ?PT Visit Diagnosis: Unsteadiness on feet (R26.81);Muscle weakness (generalized) (M62.81);Pain ?Pain - Right/Left:  (bil) ?Pain - part of body: Arm;Leg ?  ? ?Time: 8250-0370 ?PT Time Calculation (min) (ACUTE ONLY): 33 min ? ? ?Charges:   PT Evaluation ?$PT Eval Moderate Complexity: 1 Mod ?PT Treatments ?$Therapeutic Activity: 8-22  mins ?  ?   ? ? ?Marquon Alcala M,PT ?Acute Rehab Services ?[phone] ?[phone] (pager)  ? ?Alvira Philips ?11/03/2021, 1:31 PM ? ?

## 2021-11-03 NOTE — Evaluation (Signed)
Clinical/Bedside Swallow Evaluation ?Patient Details  ?Name: Sara Merritt ?MRN: [id number] ?Date of Birth: [birth year] ? ?Today's Date: 11/03/2021 ?Time: SLP Start Time (ACUTE ONLY): 1500 SLP Stop Time (ACUTE ONLY): 1520 ?SLP Time Calculation (min) (ACUTE ONLY): 20 min ? ?Past Medical History:  ?Past Medical History:  ?Diagnosis Date  ? Atrial fibrillation ([place])   ? ?Past Surgical History:  ?Past Surgical History:  ?Procedure Laterality Date  ? CHOLECYSTECTOMY N/A 2022  ? ?HPI:  ?Sara Merritt is an 88 y.o. female  presented to the hospital feeling unwell, fatigue and weakness with dizziness on standing.  EMS was called in and patient was noted to be tachycardic in the 200s with hypotension.    In the ED, patient had transient hypotension after receiving Cardizem drip so was started on amiodarone drip. PMH:  breast cancer status post lumpectomy, hypertension, CKD, atrial fibrillation, hypothyroidism, diabetes mellitus with tachycardia  ?  ?Assessment / Plan / Recommendation  ?Clinical Impression ? Patient not presenting with clinical s/s of dysphagia as per this bedside/clinical swallow evaluation, but her description as well as her daughter's does suggest patient may be experiencing GERD, esophageal dysphagia. Patient describes some foods like meats and breads, "ball up" and "grow" in her mouth and per daughter and patient, she will spit it out. Patient reportedly does not have any difficulties with liquids and when SLP observed her with straw sips of thin liquids and swallow initiation appeared timely, no observed overt s/s aspiration or penetration. SLP discussed with patient and daughter recommendation to continue on regular solids, thin liquids to give patient variety of options, but for them to focus on foods that are naturally moist or add sauces, gravies, etc, drink water throughout the day and before meals (especially in AM), order a variety of things to try on meal trays. SLP will plan to  see patient one more time to ensure diet toleration and to provide any further education to patient and family. ?SLP Visit Diagnosis: Dysphagia, unspecified (R13.10) ?   ?Aspiration Risk ? No limitations;Mild aspiration risk  ?  ?Diet Recommendation Regular;Thin liquid  ? ?Liquid Administration via: Cup;Straw ?Medication Administration: Whole meds with liquid ?Supervision: Patient able to self feed ?Compensations: Slow rate;Small sips/bites ?Postural Changes: Seated upright at 90 degrees;Remain upright for at least 30 minutes after po intake  ?  ?Other  Recommendations Oral Care Recommendations: Oral care BID   ? ?Recommendations for follow up therapy are one component of a multi-disciplinary discharge planning process, led by the attending physician.  Recommendations may be updated based on patient status, additional functional criteria and insurance authorization. ? ?Follow up Recommendations No SLP follow up  ? ? ?  ?Assistance Recommended at Discharge None  ?Functional Status Assessment    ?Frequency and Duration min 1 x/week  ?1 week ?  ?   ? ?Prognosis Prognosis for Safe Diet Advancement: Good  ? ?  ? ?Swallow Study   ?General Date of Onset: 11/03/21 ?HPI: Shanah Guimaraes is an 88 y.o. female  presented to the hospital feeling unwell, fatigue and weakness with dizziness on standing.  EMS was called in and patient was noted to be tachycardic in the 200s with hypotension.    In the ED, patient had transient hypotension after receiving Cardizem drip so was started on amiodarone drip. PMH:  breast cancer status post lumpectomy, hypertension, CKD, atrial fibrillation, hypothyroidism, diabetes mellitus with tachycardia ?Type of Study: Bedside Swallow Evaluation ?Previous Swallow Assessment: none found ?Diet Prior to this Study: Regular;Thin  liquids ?Temperature Spikes Noted: No ?Respiratory Status: Room air ?History of Recent Intubation: No ?Behavior/Cognition: Alert;Cooperative;Pleasant mood ?Oral Cavity  Assessment: Within Functional Limits ?Oral Care Completed by SLP: No ?Oral Cavity - Dentition: Adequate natural dentition;Dentures, top ?Vision: Functional for self-feeding ?Self-Feeding Abilities: Able to feed self ?Patient Positioning: Upright in bed ?Baseline Vocal Quality: Normal ?Volitional Cough: Strong ?Volitional Swallow: Able to elicit  ?  ?Oral/Motor/Sensory Function Overall Oral Motor/Sensory Function: Within functional limits   ?Ice Chips     ?Thin Liquid Thin Liquid: Within functional limits ?Presentation: Straw  ?  ?Nectar Thick     ?Honey Thick     ?Puree Puree: Not tested   ?Solid ? ? ?  Solid: Not tested  ? ?  ?Sonia Baller, MA, CCC-SLP ?Speech Therapy ? ?

## 2021-11-03 NOTE — Progress Notes (Addendum)
?PROGRESS NOTE ? ?Sara Merritt UKG:[id number] DOB: 12/27/1929 DOA: 10/31/2021 ?PCP: Marco Collie, MD ? ? LOS: 2 days  ? ?Brief narrative: ? ?Sara Merritt is a 88 y.o. female with medical history significant of breast cancer status post lumpectomy, hypertension, CKD, atrial fibrillation, hypothyroidism, diabetes mellitus with tachycardia presented to the hospital feeling unwell, fatigue and weakness with dizziness on standing.  EMS was called in and patient was noted to be tachycardic in the 200s with hypotension.    In the ED, patient had transient hypotension after receiving Cardizem drip so was started on amiodarone drip.  Initial labs showed a potassium of 3.2, INR at the 1.6 and lactate elevated at 2.2 and 2.4.  Chest x-ray showed cardiomegaly.  Patient also received 500 cc of fluid in the ED and 30 mEq of potassium and was admitted hospital for further evaluation and treatment.   ? ?During hospitalization, patient remained hypotensive and required multiple fluid boluses.  Blood culture was positive in 1 bottle for staph so vancomycin was initiated.  Patient was also given digoxin 0.25 mg x 3 with slight improvement in rate control.  Cardiology followed the patient during hospitalization. ? ?Assessment/Plan: ? ?Principal Problem: ?  Atrial fibrillation with RVR ([place]) ?Active Problems: ?  HTN (hypertension) ?  Hypothyroidism ?  Diabetes ([place]) ?  GERD (gastroesophageal reflux disease) ?  Hypokalemia ?  Leg pain, diffuse, left ?  Bacteremia ?  Acute deep vein thrombosis (DVT) of proximal vein of lower extremity (HCC) ? ? ?Atrial fibrillation with RVR ?On presentation initial RVR up to 200s with hypotension.  Received IV fluid bolus.  On amiodarone drip since could not tolerate Cardizem.  Received 3 doses of digoxin.    2D echocardiogram showed LV ejection fraction of 55 to 60% with no regional wall motion abnormality with severely elevated pulmonary artery systolic pressure.  CHA2DS2-VASc score  of 5.  Patient was on Coumadin which has been switched to Eliquis at this time.  We will follow cardiology recommendation. ? ?Lower extremity pain.  Secondary to DVT.  Ultrasound of the lower extremity showed evidence of acute DVT involving the right popliteal vein, indeterminate left femoral and proximal profunda DVT.  Patient was supposed to be on Coumadin as outpatient but was subtherapeutic.  At this time patient has been started on Eliquis.  Spoke with the patient's daughter at bedside. ? ?Gram-positive bacteremia.  Staph hemolyticus.  Patient is afebrile.  No local cytosis.  Lactate was initially slightly elevated.  CBC vancomycin initially followed by cefazolin.  We will consider discontinuation of antibiotic. ? ?Lactic acidosis. Improved with IV fluids.  ? ?Hypokalemia ?Improved after replacement. ?  ?Hypothyroidism ?Continue Synthroid. ? ?Hypertension ?Currently improved blood pressure from hypotension.  Received normal saline bolus during hospitalization.    ? ?Diabetes mellitus type II ?Continue sliding scale insulin for now.  POC glucose of 133. ? ?GERD ?Continue PPI. ? ?Hyperlipidemia ?Simvastatin has been changed to atorvastatin ? ?Hypomagnesemia. ?Improved after replacement.  Magnesium today is 1.9. ? ?Poor IV access.  Required line. ? ?Deconditioning, debility PT OT pending at this time.  Patient had refused PT OT evaluation yesterday.  From assisted living facility.  Might need skilled level of care on discharge. ? ?DVT prophylaxis:  ?apixaban (ELIQUIS) tablet 5 mg  ? ? ?Code Status: Full code ? ?Family Communication:  ?Spoke with the patient's daughter on 11/02/2021 ? ?Status is: Inpatient ? ?The patient is  inpatient because: Atrial fibrillation with RVR, new DVT, cardiology follow-up ?  ?  Consultants: ?Cardiology ? ?Procedures: ?None ? ?Anti-infectives: ? ?None ? ?Anti-infectives (From admission, onward)  ? ? Start     Dose/Rate Route Frequency Ordered Stop  ? 11/01/21 2030  ceFAZolin (ANCEF) IVPB  2g/100 mL premix       ? 2 g ?200 mL/hr over 30 Minutes Intravenous Every 8 hours 11/01/21 1947    ? 11/01/21 2000  ceFAZolin (ANCEF) IVPB 2g/100 mL premix  Status:  Discontinued       ? 2 g ?200 mL/hr over 30 Minutes Intravenous 2 times daily 11/01/21 1848 11/01/21 1947  ? ?  ? ? ?Subjective: ?Today, patient was seen and examined at bedside.  Patient states that she feels okay today.  Spoke with the patient daughter at bedside.  Denies any chest pain, palpitation,, nausea, vomiting but daughter has some issues concerning swallowing. ? ? ?Objective: ?Vitals:  ? 11/03/21 0607 11/03/21 0756  ?BP: 101/66 (!) 104/58  ?Pulse: (!) 102 (!) 120  ?Resp: 18 20  ?Temp: 98 ?F (36.7 ?C) 98.2 ?F (36.8 ?C)  ?SpO2: 97% 96%  ? ? ?Intake/Output Summary (Last 24 hours) at 11/03/2021 0842 ?Last data filed at 11/03/2021 0600 ?Gross per 24 hour  ?Intake 731.7 ml  ?Output 300 ml  ?Net 431.7 ml  ? ? ?Filed Weights  ? 10/31/21 2152 11/02/21 0339 11/03/21 0500  ?Weight: 75.5 kg 80.8 kg 81.1 kg  ? ?Body mass index is 30.69 kg/m?.  ? ?Physical Exam: ? ?General: Obese, not in obvious distress, communicative, ?HENT:   No scleral pallor or icterus noted. Oral mucosa is moist.  ?Chest:  Clear breath sounds.  Diminished breath sounds bilaterally. No crackles or wheezes.  ?CVS: S1 &S2 heard. No murmur.  Regular rate and rhythm at this time.Marland Kitchen ?Abdomen: Soft, nontender, nondistended.  Bowel sounds are heard.   ?Extremities: No cyanosis, clubbing, edema and mild tenderness on palpation of the extremities.  Peripheral pulses are palpable. ?Psych: Alert, awake and oriented, normal mood ?CNS:  No cranial nerve deficits.  Power equal in all extremities.   ?Skin: Warm and dry.  No rashes noted. ? ? ?Data Review: I have personally reviewed the following laboratory data and studies, ? ?CBC: ?Recent Labs  ?Lab 10/31/21 ?1425 11/01/21 ?0428 11/02/21 ?6384  ?WBC 8.4 6.8 7.4  ?NEUTROABS 6.4  --   --   ?HGB 13.5 11.9* 11.3*  ?HCT 39.9 36.1 35.1*  ?MCV 103.9* 106.5*  107.7*  ?PLT 197 142* 123*  ? ? ?Basic Metabolic Panel: ?Recent Labs  ?Lab 10/31/21 ?1540 10/31/21 ?2142 11/01/21 ?0428 11/02/21 ?0244  ?NA 137  --  139 138  ?K 3.2*  --  3.9 4.9  ?CL 101  --  107 111  ?CO2 22  --  23 21*  ?GLUCOSE 127*  --  106* 104*  ?BUN 32*  --  27* 16  ?CREATININE 0.99  --  0.86 0.74  ?CALCIUM 8.9  --  8.4* 8.2*  ?MG  --  1.5*  --  1.9  ? ? ?Liver Function Tests: ?Recent Labs  ?Lab 11/02/21 ?6659  ?AST 28  ?ALT 19  ?ALKPHOS 48  ?BILITOT 0.4  ?PROT 4.6*  ?ALBUMIN 2.1*  ? ? ?No results for input(s): LIPASE, AMYLASE in the last 168 hours. ?No results for input(s): AMMONIA in the last 168 hours. ?Cardiac Enzymes: ?No results for input(s): CKTOTAL, CKMB, CKMBINDEX, TROPONINI in the last 168 hours. ?BNP (last 3 results) ?No results for input(s): BNP in the last 8760 hours. ? ?ProBNP (last 3 results) ?No  results for input(s): PROBNP in the last 8760 hours. ? ?CBG: ?Recent Labs  ?Lab 11/02/21 ?0759 11/02/21 ?1208 11/02/21 ?1647 11/02/21 ?2036 11/03/21 ?0753  ?GLUCAP 98 129* 145* 100* 133*  ? ? ?Recent Results (from the past 240 hour(s))  ?Culture, blood (Routine X 2) w Reflex to ID Panel     Status: Abnormal (Preliminary result)  ? Collection Time: 10/31/21  2:20 PM  ? Specimen: BLOOD  ?Result Value Ref Range Status  ? Specimen Description BLOOD RIGHT ANTECUBITAL  Final  ? Special Requests   Final  ?  BOTTLES DRAWN AEROBIC AND ANAEROBIC Blood Culture results may not be optimal due to an excessive volume of blood received in culture bottles  ? Culture  Setup Time   Final  ?  GRAM POSITIVE COCCI IN CLUSTERS ?BOTTLES DRAWN AEROBIC ONLY ?CRITICAL RESULT CALLED TO, READ BACK BY AND VERIFIED WITH: PHARMD T DANG [id number] FCP ?  ? Culture (A)  Final  ?  STAPHYLOCOCCUS HAEMOLYTICUS ?THE SIGNIFICANCE OF ISOLATING THIS ORGANISM FROM A SINGLE SET OF BLOOD CULTURES WHEN MULTIPLE SETS ARE DRAWN IS UNCERTAIN. PLEASE NOTIFY THE MICROBIOLOGY DEPARTMENT WITHIN ONE WEEK IF SPECIATION AND SENSITIVITIES ARE  REQUIRED. ?Performed at [facility], [place] [address] [place], [place] [address] ?  ? Report Status PENDING  Incomplete  ?Blood Culture ID Panel (Reflexed)     Status: Abnormal  ? Collection Time: 10/31/21

## 2021-11-03 NOTE — Progress Notes (Signed)
Peripherally Inserted Central Catheter Placement ? ?The IV Nurse has discussed with the patient and/or persons authorized to consent for the patient, the purpose of this procedure and the potential benefits and risks involved with this procedure.  The benefits include less needle sticks, lab draws from the catheter, and the patient may be discharged home with the catheter. Risks include, but not limited to, infection, bleeding, blood clot (thrombus formation), and puncture of an artery; nerve damage and irregular heartbeat and possibility to perform a PICC exchange if needed/ordered by physician.  Alternatives to this procedure were also discussed.  Bard Power PICC patient education guide, fact sheet on infection prevention and patient information card has been provided to patient /or left at bedside.  Consent obtained from daughter at bedside, pt agreeable to procedure. ? ?PICC Placement Documentation  ?PICC Double Lumen 48/88/91 Right Basilic 36 cm 0 cm (Active)  ?Indication for Insertion or Continuance of Line Vasoactive infusions;Administration of hyperosmolar/irritating solutions (i.e. TPN, Vancomycin, etc.);Poor Vasculature-patient has had multiple peripheral attempts or PIVs lasting less than 24 hours 11/03/21 0925  ?Exposed Catheter (cm) 0 cm 11/03/21 0925  ?Site Assessment Clean, Dry, Intact 11/03/21 0925  ?Lumen #1 Status Flushed;Saline locked;Blood return noted 11/03/21 0925  ?Lumen #2 Status Flushed;Saline locked;Blood return noted 11/03/21 0925  ?Dressing Type Securing device;Transparent 11/03/21 0925  ?Dressing Status Antimicrobial disc in place;Clean, Dry, Intact 11/03/21 0925  ?Safety Lock Not Applicable 69/45/03 8882  ?Line Care Connections checked and tightened 11/03/21 0925  ?Line Adjustment (NICU/IV Team Only) No 11/03/21 0925  ?Dressing Intervention New dressing 11/03/21 0925  ?Dressing Change Due 11/10/21 11/03/21 0925  ? ? ? ? ? ?Rolena Infante ?11/03/2021, 9:26 AM ? ?

## 2021-11-04 DIAGNOSIS — I824Y3 Acute embolism and thrombosis of unspecified deep veins of proximal lower extremity, bilateral: Secondary | ICD-10-CM | POA: Diagnosis not present

## 2021-11-04 DIAGNOSIS — I4891 Unspecified atrial fibrillation: Secondary | ICD-10-CM | POA: Diagnosis not present

## 2021-11-04 DIAGNOSIS — R7881 Bacteremia: Secondary | ICD-10-CM | POA: Diagnosis not present

## 2021-11-04 DIAGNOSIS — E119 Type 2 diabetes mellitus without complications: Secondary | ICD-10-CM | POA: Diagnosis not present

## 2021-11-04 LAB — COMPREHENSIVE METABOLIC PANEL
ALT: 7 U/L (ref 0–44)
AST: 21 U/L (ref 15–41)
Albumin: 1.8 g/dL — ABNORMAL LOW (ref 3.5–5.0)
Alkaline Phosphatase: 49 U/L (ref 38–126)
Anion gap: 5 (ref 5–15)
BUN: 5 mg/dL — ABNORMAL LOW (ref 8–23)
CO2: 24 mmol/L (ref 22–32)
Calcium: 7.9 mg/dL — ABNORMAL LOW (ref 8.9–10.3)
Chloride: 107 mmol/L (ref 98–111)
Creatinine, Ser: 0.65 mg/dL (ref 0.44–1.00)
GFR, Estimated: >60 mL/min (ref >60)
Glucose, Bld: 189 mg/dL — ABNORMAL HIGH (ref 70–99)
Potassium: 3.7 mmol/L (ref 3.5–5.1)
Sodium: 136 mmol/L (ref 135–145)
Total Bilirubin: 0.4 mg/dL (ref 0.3–1.2)
Total Protein: 4.3 g/dL — ABNORMAL LOW (ref 6.5–8.1)

## 2021-11-04 LAB — GLUCOSE, CAPILLARY
Glucose-Capillary: 104 mg/dL — ABNORMAL HIGH (ref 70–99)
Glucose-Capillary: 113 mg/dL — ABNORMAL HIGH (ref 70–99)
Glucose-Capillary: 105 mg/dL — ABNORMAL HIGH (ref 70–99)
Glucose-Capillary: 111 mg/dL — ABNORMAL HIGH (ref 70–99)

## 2021-11-04 LAB — CBC
HCT: 33 % — ABNORMAL LOW (ref 36.0–46.0)
Hemoglobin: 11 g/dL — ABNORMAL LOW (ref 12.0–15.0)
MCH: 35.3 pg — ABNORMAL HIGH (ref 26.0–34.0)
MCHC: 33.3 g/dL (ref 30.0–36.0)
MCV: 105.8 fL — ABNORMAL HIGH (ref 80.0–100.0)
Platelets: 113 10*3/uL — ABNORMAL LOW (ref 150–400)
RBC: 3.12 MIL/uL — ABNORMAL LOW (ref 3.87–5.11)
RDW: 14.3 % (ref 11.5–15.5)
WBC: 5.4 10*3/uL (ref 4.0–10.5)
nRBC: 0 % (ref 0.0–0.2)

## 2021-11-04 LAB — MAGNESIUM: Magnesium: 1.7 mg/dL (ref 1.7–2.4)

## 2021-11-04 MED ORDER — POTASSIUM CHLORIDE CRYS ER 20 MEQ PO TBCR
40.0000 meq | EXTENDED_RELEASE_TABLET | Freq: Once | ORAL | Status: AC
  Administered 2021-11-04: 40 meq via ORAL
  Filled 2021-11-04: qty 2

## 2021-11-04 MED ORDER — METOPROLOL TARTRATE 25 MG PO TABS
25.0000 mg | ORAL_TABLET | Freq: Three times a day (TID) | ORAL | Status: DC
  Administered 2021-11-04 – 2021-11-05 (×4): 25 mg via ORAL
  Filled 2021-11-04 (×4): qty 1

## 2021-11-04 NOTE — Progress Notes (Addendum)
?PROGRESS NOTE ? ?Sara Merritt OFB:[id number] DOB: [birth year] DOA: 10/31/2021 ?PCP: Sara Collie, MD ? ? LOS: 3 days  ? ?Brief narrative: ? ?Sara Merritt is a 88 y.o. female with medical history significant of breast cancer status post lumpectomy, hypertension, CKD, atrial fibrillation, hypothyroidism, diabetes mellitus with tachycardia presented to the hospital feeling unwell, fatigue and weakness with dizziness on standing.  EMS was called in and patient was noted to be tachycardic in the 200s with hypotension.    In the ED, patient had transient hypotension after receiving Cardizem drip so was started on amiodarone drip.  Initial labs showed a potassium of 3.2, INR at the 1.6 and lactate elevated at 2.2 and 2.4.  Chest x-ray showed cardiomegaly.  Patient also received 500 cc of fluid in the ED and 30 mEq of potassium and was admitted hospital for further evaluation and treatment.   ? ?During hospitalization, patient remained hypotensive and required multiple fluid boluses.  Blood culture was positive in 1 bottle for staph so vancomycin was initiated.  Patient was also given digoxin 0.25 mg x 3 with slight improvement in rate control.  Cardiology followed the patient during hospitalization and was continued on amiodarone drip with plans to change to p.o.. ? ?Assessment/Plan: ? ?Principal Problem: ?  Atrial fibrillation with rapid ventricular response ([place]) ?Active Problems: ?  HTN (hypertension) ?  Hypothyroidism ?  Diabetes ([place]) ?  GERD (gastroesophageal reflux disease) ?  Hypokalemia ?  Leg pain, diffuse, left ?  Bacteremia ?  Acute deep vein thrombosis (DVT) of proximal vein of lower extremity (H[facility]) ? ? ?Atrial fibrillation with RVR ?On presentation initial RVR up to 200s with hypotension.  Received IV fluid bolus.  On amiodarone drip since could not tolerate Cardizem.  Received 3 doses of digoxin.    2D echocardiogram showed LV ejection fraction of 55 to 60% with no regional wall motion  abnormality with severely elevated pulmonary artery systolic pressure.  CHA2DS2-VASc score of 5.  Patient was on Coumadin which has been switched to Eliquis at this time.  Patient was on amiodarone drip during hospitalization which has been discontinued today.  Metoprolol has been titrated. ? ?Lower extremity pain.  Secondary to DVT.  Ultrasound of the lower extremity showed evidence of acute DVT involving the right popliteal vein, indeterminate left femoral and proximal profunda DVT.  Patient was supposed to be on Coumadin as outpatient but was subtherapeutic.  At this time patient has been started on Eliquis.  Spoke with the patient's daughter at bedside. ? ?Gram-positive bacteremia.  Staph hemolyticus.  Likely contaminant.  Off antibiotic at this time.  Afebrile.  Cytosis. ? ?Lactic acidosis. Improved with IV fluids.  ? ?Hypokalemia ?Improved after replacement.  Latest potassium of 3.7 ?  ?Hypothyroidism ?Continue Synthroid. ? ?Hypertension ?Currently improved blood pressure from hypotension.  Received normal saline bolus during hospitalization.   On amiodarone for rate control.  Beta-blocker has been titrated. ? ?Diabetes mellitus type II ?Continue sliding scale insulin for now.  Overall controlled. ? ?GERD ?Continue PPI. ? ?Hyperlipidemia ?Simvastatin has been changed to atorvastatin ? ?Hypomagnesemia. ?Improved after replacement.  Magnesium today is 1.7.  We will continue to replenish orally. ? ?Poor IV access.  Required PICC line. ? ?Deconditioning, debility PT OT has seen the patient at this time.  From assisted living facility.  Will need home health services assisted living facility on discharge. ? ?DVT prophylaxis: Place TED hose Start: 11/03/21 1347 ?apixaban (ELIQUIS) tablet 5 mg  ? ? ?Code Status:  Full code ? ?Family Communication:  ?Spoke with the patient's brother at bedside on 11/04/2021. ? ?Status is: Inpatient ? ?The patient is  inpatient because: Atrial fibrillation with RVR, new DVT, cardiology  follow-up ?  ?Consultants: ?Cardiology ? ?Procedures: ?None ? ?Anti-infectives: ? ?None ? ?Anti-infectives (From admission, onward)  ? ? Start     Dose/Rate Route Frequency Ordered Stop  ? 11/01/21 2030  ceFAZolin (ANCEF) IVPB 2g/100 mL premix  Status:  Discontinued       ? 2 g ?200 mL/hr over 30 Minutes Intravenous Every 8 hours 11/01/21 1947 11/03/21 1044  ? 11/01/21 2000  ceFAZolin (ANCEF) IVPB 2g/100 mL premix  Status:  Discontinued       ? 2 g ?200 mL/hr over 30 Minutes Intravenous 2 times daily 11/01/21 1848 11/01/21 1947  ? ?  ? ? ?Subjective: ?Today, patient was seen and examined at bedside.  Denies any chest pain, shortness of breath or dyspnea.  Patient's brother at bedside.  ? ?Objective: ?Vitals:  ? 11/04/21 0018 11/04/21 0740  ?BP: (!) 95/58 101/70  ?Pulse: 92 87  ?Resp: 19 17  ?Temp: 98.4 ?F (36.9 ?C) 98.2 ?F (36.8 ?C)  ?SpO2: 96% 95%  ? ? ?Intake/Output Summary (Last 24 hours) at 11/04/2021 1022 ?Last data filed at 11/04/2021 1012 ?Gross per 24 hour  ?Intake 885.93 ml  ?Output --  ?Net 885.93 ml  ? ? ?Filed Weights  ? 10/31/21 2152 11/02/21 0339 11/03/21 0500  ?Weight: 75.5 kg 80.8 kg 81.1 kg  ? ?Body mass index is 30.69 kg/m?.  ? ?Physical Exam: ? ?General: Obese built, not in obvious distress ?HENT:   No scleral pallor or icterus noted. Oral mucosa is moist.  ?Chest:  Clear breath sounds.  Diminished breath sounds bilaterally. No crackles or wheezes.  ?CVS: S1 &S2 heard. No murmur.  Regular rhythm. ?Abdomen: Soft, nontender, nondistended.  Bowel sounds are heard.   ?Extremities: No cyanosis, clubbing or edema.  Bilateral lower extremity mild tenderness on palpation.  Peripheral pulses are palpable. ?Psych: Alert, awake and communicative, normal mood ?CNS:  No cranial nerve deficits.  All extremities. ?Skin: Warm and dry.  No rashes noted. ? ?Data Review: I have personally reviewed the following laboratory data and studies, ? ?CBC: ?Recent Labs  ?Lab 10/31/21 ?1425 11/01/21 ?0428 11/02/21 ?4742  11/04/21 ?0348  ?WBC 8.4 6.8 7.4 5.4  ?NEUTROABS 6.4  --   --   --   ?HGB 13.5 11.9* 11.3* 11.0*  ?HCT 39.9 36.1 35.1* 33.0*  ?MCV 103.9* 106.5* 107.7* 105.8*  ?PLT 197 142* 123* 113*  ? ? ?Basic Metabolic Panel: ?Recent Labs  ?Lab 10/31/21 ?1540 10/31/21 ?2142 11/01/21 ?5956 11/02/21 ?0244 11/03/21 ?3875 11/04/21 ?0348  ?NA 137  --  139 138 136 136  ?K 3.2*  --  3.9 4.9 4.3 3.7  ?CL 101  --  107 111 110 107  ?CO2 22  --  23 21* 21* 24  ?GLUCOSE 127*  --  106* 104* 123* 189*  ?BUN 32*  --  27* 16 7* 5*  ?CREATININE 0.99  --  0.86 0.74 0.73 0.65  ?CALCIUM 8.9  --  8.4* 8.2* 8.1* 7.9*  ?MG  --  1.5*  --  1.9  --  1.7  ? ? ?Liver Function Tests: ?Recent Labs  ?Lab 11/02/21 ?6433 11/04/21 ?2951  ?AST 28 21  ?ALT 19 7  ?ALKPHOS 48 49  ?BILITOT 0.4 0.4  ?PROT 4.6* 4.3*  ?ALBUMIN 2.1* 1.8*  ? ? ?No results for  input(s): LIPASE, AMYLASE in the last 168 hours. ?No results for input(s): AMMONIA in the last 168 hours. ?Cardiac Enzymes: ?No results for input(s): CKTOTAL, CKMB, CKMBINDEX, TROPONINI in the last 168 hours. ?BNP (last 3 results) ?No results for input(s): BNP in the last 8760 hours. ? ?ProBNP (last 3 results) ?No results for input(s): PROBNP in the last 8760 hours. ? ?CBG: ?Recent Labs  ?Lab 11/03/21 ?0753 11/03/21 ?1152 11/03/21 ?1617 11/03/21 ?2057 11/04/21 ?7915  ?GLUCAP 133* 110* 109* 116* 104*  ? ? ?Recent Results (from the past 240 hour(s))  ?Culture, blood (Routine X 2) w Reflex to ID Panel     Status: Abnormal  ? Collection Time: 10/31/21  2:20 PM  ? Specimen: BLOOD  ?Result Value Ref Range Status  ? Specimen Description BLOOD RIGHT ANTECUBITAL  Final  ? Special Requests   Final  ?  BOTTLES DRAWN AEROBIC AND ANAEROBIC Blood Culture results may not be optimal due to an excessive volume of blood received in culture bottles  ? Culture  Setup Time   Final  ?  GRAM POSITIVE COCCI IN CLUSTERS ?BOTTLES DRAWN AEROBIC ONLY ?CRITICAL RESULT CALLED TO, READ BACK BY AND VERIFIED WITH: PHARMD T DANG [id number] FCP ?  ?  Culture (A)  Final  ?  STAPHYLOCOCCUS HAEMOLYTICUS ?THE SIGNIFICANCE OF ISOLATING THIS ORGANISM FROM A SINGLE SET OF BLOOD CULTURES WHEN MULTIPLE SETS ARE DRAWN IS UNCERTAIN. PLEASE NOTIFY THE MICROBIOLOGY DEPA

## 2021-11-04 NOTE — Progress Notes (Signed)
? ?Progress Note ? ?Patient Name: Sara Merritt ?Date of Encounter: 11/04/2021 ? ?[facility] Cardiologist: Elouise Munroe, MD  ? ?Subjective  ? ?BP 101/70 this morning.  Denies chest pain or dyspnea or palpitations ? ?Inpatient Medications  ?  ?Scheduled Meds: ? allopurinol  200 mg Oral Daily  ? apixaban  5 mg Oral BID  ? atorvastatin  10 mg Oral Daily  ? calcium carbonate  1,250 mg Oral Daily  ? Chlorhexidine Gluconate Cloth  6 each Topical Daily  ? feeding supplement  1 Container Oral TID BM  ? insulin aspart  0-9 Units Subcutaneous TID WC  ? levothyroxine  75 mcg Oral Daily  ? magnesium oxide  400 mg Oral BID  ? metoprolol tartrate  25 mg Oral BID  ? pantoprazole  40 mg Oral Daily  ? sodium chloride flush  10-40 mL Intracatheter Q12H  ? ?Continuous Infusions: ? amiodarone 30 mg/hr (11/03/21 2125)  ? ?PRN Meds: ?acetaminophen, ondansetron (ZOFRAN) IV, sodium chloride flush  ? ?Vital Signs  ?  ?Vitals:  ? 11/03/21 1840 11/03/21 2053 11/04/21 0018 11/04/21 0740  ?BP:  103/88 (!) 95/58 101/70  ?Pulse: (!) 128 (!) 109 92 87  ?Resp: '19 13 19 17  '$ ?Temp:  98.6 ?F (37 ?C) 98.4 ?F (36.9 ?C) 98.2 ?F (36.8 ?C)  ?TempSrc:  Oral Oral Oral  ?SpO2: 96% 97% 96% 95%  ?Weight:      ?Height:      ? ? ?Intake/Output Summary (Last 24 hours) at 11/04/2021 0802 ?Last data filed at 11/04/2021 0600 ?Gross per 24 hour  ?Intake 875.93 ml  ?Output --  ?Net 875.93 ml  ? ? ? ?  11/03/2021  ?  5:00 AM 11/02/2021  ?  3:39 AM 10/31/2021  ?  9:52 PM  ?Last 3 Weights  ?Weight (lbs) 178 lb 12.7 oz 178 lb 2.1 oz 166 lb 7.2 oz  ?Weight (kg) 81.1 kg 80.8 kg 75.5 kg  ?   ? ?Telemetry  ?  ?Heart rates currently 80s to 100.  Atrial fibrillation.- Personally Reviewed ? ?ECG  ?  ?No new ECG tracing today. - Personally Reviewed ? ?Physical Exam  ? ?GEN: No acute distress.  Elderly ?Neck: No JVD ?Cardiac:  irregular tachycardic, no murmurs, rubs, or gallops.  ?Respiratory: Clear to auscultation bilaterally. ?GI: Soft, nontender, non-distended   ?MS: No edema; No deformity. ?Neuro:  Nonfocal  ?Psych: Normal affect  ? ?Labs  ?  ?High Sensitivity Troponin:  No results for input(s): TROPONINIHS in the last 720 hours.   ?Chemistry ?Recent Labs  ?Lab 10/31/21 ?2142 11/01/21 ?0428 11/02/21 ?0244 11/03/21 ?9417 11/04/21 ?0348  ?NA  --    < > 138 136 136  ?K  --    < > 4.9 4.3 3.7  ?CL  --    < > 111 110 107  ?CO2  --    < > 21* 21* 24  ?GLUCOSE  --    < > 104* 123* 189*  ?BUN  --    < > 16 7* 5*  ?CREATININE  --    < > 0.74 0.73 0.65  ?CALCIUM  --    < > 8.2* 8.1* 7.9*  ?MG 1.5*  --  1.9  --  1.7  ?PROT  --   --  4.6*  --  4.3*  ?ALBUMIN  --   --  2.1*  --  1.8*  ?AST  --   --  28  --  21  ?ALT  --   --  19  --  7  ?ALKPHOS  --   --  48  --  49  ?BILITOT  --   --  0.4  --  0.4  ?GFRNONAA  --    < > >60 >60 >60  ?ANIONGAP  --    < > '6 5 5  '$ ? < > = values in this interval not displayed.  ? ?  ?Lipids No results for input(s): CHOL, TRIG, HDL, LABVLDL, LDLCALC, CHOLHDL in the last 168 hours.  ?Hematology ?Recent Labs  ?Lab 11/01/21 ?0428 11/02/21 ?1517 11/04/21 ?0348  ?WBC 6.8 7.4 5.4  ?RBC 3.39* 3.26* 3.12*  ?HGB 11.9* 11.3* 11.0*  ?HCT 36.1 35.1* 33.0*  ?MCV 106.5* 107.7* 105.8*  ?MCH 35.1* 34.7* 35.3*  ?MCHC 33.0 32.2 33.3  ?RDW 14.0 14.3 14.3  ?PLT 142* 123* 113*  ? ? ?Thyroid  ?Recent Labs  ?Lab 11/03/21 ?0751  ?TSH 6.793*  ?FREET4 1.35*  ? ?  ?BNPNo results for input(s): BNP, PROBNP in the last 168 hours.  ?DDimer No results for input(s): DDIMER in the last 168 hours.  ? ?Radiology  ?  ?DG CHEST PORT 1 VIEW ? ?Addendum Date: 11/03/2021   ?ADDENDUM REPORT: 11/03/2021 10:37 ADDENDUM: New PICC line placement. On the current exam there is a right-sided PICC. Tip projects in the lower superior vena cava, well positioned. Electronically Signed   By: Lajean Manes M.D.   On: 11/03/2021 10:37  ? ?Result Date: 11/03/2021 ?CLINICAL DATA:  Tachycardia.  Atrial fibrillation.  Follow-up exam. EXAM: PORTABLE CHEST 1 VIEW COMPARISON:  10/31/2021 and older exams. FINDINGS: Mild  enlargement of the cardiopericardial silhouette. Large hiatal hernia superimposed on the cardiac silhouette, stable. No mediastinal or hilar masses. Mild lung base opacities, left greater than right, consistent with atelectasis or scarring. Lungs otherwise clear. No convincing pleural effusion. No pneumothorax. Left-sided rib fractures, at least the left lateral 6, seventh and eighth ribs, which appear recent. IMPRESSION: 1. No acute cardiopulmonary disease. 2. Stable cardiomegaly.  Stable large hiatal hernia. 3. Left-sided rib fractures. These are stable compared to the CT from 09/14/2021. Electronically Signed: By: Lajean Manes M.D. On: 11/03/2021 10:26  ? ?VAS [place] LOWER EXTREMITY VENOUS (DVT) ? ?Result Date: 11/02/2021 ? Lower Venous DVT Study Patient Name:  Sara Merritt  Date of Exam:   11/02/2021 Medical Rec #: [id number]                Accession #:    [id number] Date of Birth: 10/11/1929                Patient Gender: F Patient Age:   88 years Exam Location:  [facility] Procedure:      VAS [place] LOWER EXTREMITY VENOUS (DVT) Referring Phys: [facility] POKHREL --------------------------------------------------------------------------------  Indications: Bilateral lower extremity pain, left greater than right.  Limitations: Poor ultrasound/tissue interface and body habitus. Comparison Study: No prior study Performing Technologist: Maudry Mayhew MHA, RDMS, RVT, RDCS  Examination Guidelines: A complete evaluation includes B-mode imaging, spectral Doppler, color Doppler, and power Doppler as needed of all accessible portions of each vessel. Bilateral testing is considered an integral part of a complete examination. Limited examinations for reoccurring indications may be performed as noted. The reflux portion of the exam is performed with the patient in reverse Trendelenburg.  +---------+---------------+---------+-----------+----------+--------------+ RIGHT     CompressibilityPhasicitySpontaneityPropertiesThrombus Aging +---------+---------------+---------+-----------+----------+--------------+ CFV      Full           Yes      Yes                                 +---------+---------------+---------+-----------+----------+--------------+  SFJ      Full                                                        +---------+---------------+---------+-----------+----------+--------------+ FV Prox  Full                                                        +---------+---------------+---------+-----------+----------+--------------+ FV Mid   Full                                                        +---------+---------------+---------+-----------+----------+--------------+ FV DistalFull                                                        +---------+---------------+---------+-----------+----------+--------------+ PFV      Full                                                        +---------+---------------+---------+-----------+----------+--------------+ POP      None                    No                   Acute          +---------+---------------+---------+-----------+----------+--------------+   Right Technical Findings: Not visualized segments include PTV. peroneal veins.  +---------+---------------+---------+-----------+----------+-----------------+ LEFT     CompressibilityPhasicitySpontaneityPropertiesThrombus Aging    +---------+---------------+---------+-----------+----------+-----------------+ CFV      Full           Yes      Yes                                    +---------+---------------+---------+-----------+----------+-----------------+ SFJ      Full                                                           +---------+---------------+---------+-----------+----------+-----------------+ FV Prox  None                    No                   Acute              +---------+---------------+---------+-----------+----------+-----------------+ FV Mid   None  Acute             +---------+---------------+---------+-----------+----------+-----------------+ FV DistalNone                                         Acute             +---------+---------------+---------+-----------+--------

## 2021-11-04 NOTE — Evaluation (Signed)
Occupational Therapy Evaluation ?Patient Details ?Name: Sara Merritt ?MRN: [id number] ?DOB: 06/14/1930 ?Today's Date: 11/04/2021 ? ? ?History of Present Illness 88 y.o. female  presented to ED on 3/22 with fatigue and weakness with dizziness on standing.  EMS was called in and patient was noted to be tachycardic in the 200s with hypotension. EKG showing a-fib with RVR. In the ED, patient had transient hypotension after receiving Cardizem drip so was started on amiodarone drip. PMH:  breast cancer s/p lumpectomy, HTN, CKD, a-fib, hypothyroidism, diabetes mellitus, and tachycardia.  ? ?Clinical Impression ?  ?PTA, pt was at [facility] and required assistance for ADLs and transfers. Pt currently requiring Mod A for UB ADLs, Max A for LB ADLs, and Mod A +2 for bed mobility. Pt tolerating sitting at EOB for ~10 minutes with Min Guard-Min A. Pt would benefit from further acute OT to facilitate safe dc. Recommend dc to SNF for further OT to optimize safety, independence with ADLs, and return to PLOF.   ?   ? ?Recommendations for follow up therapy are one component of a multi-disciplinary discharge planning process, led by the attending physician.  Recommendations may be updated based on patient status, additional functional criteria and insurance authorization.  ? ?Follow Up Recommendations ? Skilled nursing-short term rehab (<3 hours/day)  ?  ?Assistance Recommended at Discharge Frequent or constant Supervision/Assistance  ?Patient can return home with the following A lot of help with bathing/dressing/bathroom;Two people to help with walking and/or transfers ? ?  ?Functional Status Assessment ? Patient has had a recent decline in their functional status and demonstrates the ability to make significant improvements in function in a reasonable and predictable amount of time.  ?Equipment Recommendations ? None recommended by OT  ?  ?Recommendations for Other Services PT consult ? ? ?  ?Precautions /  Restrictions Precautions ?Precautions: Fall  ? ?  ? ?Mobility Bed Mobility ?Overal bed mobility: Needs Assistance ?Bed Mobility: Supine to Sit, Sit to Supine ?  ?  ?Supine to sit: Mod assist, +2 for physical assistance, +2 for safety/equipment ?Sit to supine: Mod assist, +2 for physical assistance, +2 for safety/equipment ?  ?General bed mobility comments: Mod A for elevating trunk and bringing hips towards EOB. Mod A +2 for return to supine. ?  ? ?Transfers ?  ?  ?  ?  ?  ?  ?  ?  ?  ?General transfer comment: Defer due to fatigue ?  ? ?  ?Balance Overall balance assessment: Needs assistance ?Sitting-balance support: No upper extremity supported, Feet supported, Single extremity supported ?Sitting balance-Leahy Scale: Poor ?Sitting balance - Comments: presenting with posterior lean. Min Guard-Min A for corrections. Tolerating ~ 10 min EOB ?Postural control: Posterior lean ?  ?  ?  ?  ?  ?  ?  ?  ?  ?  ?  ?  ?  ?  ?   ? ?ADL either performed or assessed with clinical judgement  ? ?ADL Overall ADL's : Needs assistance/impaired ?Eating/Feeding: Set up;Bed level ?  ?Grooming: Supervision/safety;Set up;Bed level ?  ?Upper Body Bathing: Moderate assistance;Sitting;Bed level ?  ?Lower Body Bathing: Maximal assistance;Bed level ?  ?Upper Body Dressing : Moderate assistance;Sitting;Bed level ?  ?Lower Body Dressing: Maximal assistance;Bed level ?  ?  ?  ?  ?  ?  ?  ?  ?General ADL Comments: Pt tolerating sitting at EOB for ~ 10 min with Min A.  ? ? ? ?Vision   ?   ?   ?  Perception   ?  ?Praxis   ?  ? ?Pertinent Vitals/Pain Pain Assessment ?Pain Assessment: Faces ?Faces Pain Scale: Hurts little more ?Pain Location: BLEs ?Pain Descriptors / Indicators: Grimacing, Guarding, Aching, Sharp ?Pain Intervention(s): Monitored during session, Repositioned  ? ? ? ?Hand Dominance   ?  ?Extremity/Trunk Assessment Upper Extremity Assessment ?Upper Extremity Assessment: Generalized weakness ?  ?Lower Extremity Assessment ?Lower Extremity  Assessment: Defer to PT evaluation ?  ?  ?  ?Communication Communication ?Communication: No difficulties ?  ?Cognition Arousal/Alertness: Awake/alert ?Behavior During Therapy: [facility] for tasks assessed/performed ?Overall Cognitive Status: Within Functional Limits for tasks assessed ?  ?  ?  ?  ?  ?  ?  ?  ?  ?  ?  ?  ?  ?  ?  ?  ?General Comments: Vert pleasant and motivated ?  ?  ?General Comments  BP supine at end of session 119/65. ? ?  ?Exercises   ?  ?Shoulder Instructions    ? ? ?Home Living Family/patient expects to be discharged to:: Assisted living ?  ?  ?  ?  ?  ?  ?  ?  ?  ?  ?  ?  ?  ?  ?  ?  ?Additional Comments: In Feb, fell and broke 3 ribs, gallbladder removed and afib began; Went to [facility] and had BP issues there; Was walking with walker short distances there for a bit;  First of March pt moved to [facility] and pt was getting pt up as well and still had lightheadedness etc.Passed out on toilet at A living prior to this admit. ?  ? ?  ?Prior Functioning/Environment   ?  ?  ?  ?  ?  ?  ?Mobility Comments: In Feb was walking on her own with RW at home and rollator when she went up. ?ADLs Comments: Independent prior to Feb. requiring assistance for ADLs since fall ?  ? ?  ?  ?OT Problem List: Decreased strength;Decreased range of motion;Decreased activity tolerance;Impaired balance (sitting and/or standing);Decreased knowledge of use of DME or AE;Decreased knowledge of precautions ?  ?   ?OT Treatment/Interventions: Self-care/ADL training;Therapeutic exercise;Energy conservation;DME and/or AE instruction;Therapeutic activities;Balance training  ?  ?OT Goals(Current goals can be found in the care plan section) Acute Rehab OT Goals ?Patient Stated Goal: Get stronger ?OT Goal Formulation: With patient ?Time For Goal Achievement: 11/18/21 ?Potential to Achieve Goals: Good  ?OT Frequency: Min 2X/week ?  ? ?Co-evaluation   ?  ?  ?  ?  ? ?  ?AM-PAC OT "6 Clicks" Daily Activity     ?Outcome Measure  Help from another person eating meals?: None ?Help from another person taking care of personal grooming?: A Little ?Help from another person toileting, which includes using toliet, bedpan, or urinal?: A Lot ?Help from another person bathing (including washing, rinsing, drying)?: A Lot ?Help from another person to put on and taking off regular upper body clothing?: A Lot ?Help from another person to put on and taking off regular lower body clothing?: A Lot ?6 Click Score: 15 ?  ?End of Session Nurse Communication: Mobility status ? ?Activity Tolerance: Patient limited by fatigue ?Patient left: in bed;with call bell/phone within reach;with family/visitor present ? ?OT Visit Diagnosis: Unsteadiness on feet (R26.81);Other abnormalities of gait and mobility (R26.89);Muscle weakness (generalized) (M62.81)  ?              ?Time: 5956-3875 ?OT Time Calculation (min): 30 min ?Charges:  OT General Charges ?$OT Visit: 1 Visit ?OT Evaluation ?$OT Eval Moderate Complexity: 1 Mod ?OT Treatments ?$Self Care/Home Management : 8-22 mins ? ?Aryah Doering MSOT, OTR/L ?Acute Rehab ?Pager: [phone] ?Office: [phone] ? ?Ife Vitelli M Necole Minassian ?11/04/2021, 1:11 PM ?

## 2021-11-05 DIAGNOSIS — M79605 Pain in left leg: Secondary | ICD-10-CM

## 2021-11-05 DIAGNOSIS — R7881 Bacteremia: Secondary | ICD-10-CM | POA: Diagnosis not present

## 2021-11-05 DIAGNOSIS — K219 Gastro-esophageal reflux disease without esophagitis: Secondary | ICD-10-CM | POA: Diagnosis not present

## 2021-11-05 DIAGNOSIS — I4891 Unspecified atrial fibrillation: Secondary | ICD-10-CM | POA: Diagnosis not present

## 2021-11-05 DIAGNOSIS — I824Y3 Acute embolism and thrombosis of unspecified deep veins of proximal lower extremity, bilateral: Secondary | ICD-10-CM | POA: Diagnosis not present

## 2021-11-05 LAB — CBC
HCT: 34.5 % — ABNORMAL LOW (ref 36.0–46.0)
Hemoglobin: 11.4 g/dL — ABNORMAL LOW (ref 12.0–15.0)
MCH: 35 pg — ABNORMAL HIGH (ref 26.0–34.0)
MCHC: 33 g/dL (ref 30.0–36.0)
MCV: 105.8 fL — ABNORMAL HIGH (ref 80.0–100.0)
Platelets: 129 10*3/uL — ABNORMAL LOW (ref 150–400)
RBC: 3.26 MIL/uL — ABNORMAL LOW (ref 3.87–5.11)
RDW: 14.2 % (ref 11.5–15.5)
WBC: 5.5 10*3/uL (ref 4.0–10.5)
nRBC: 0 % (ref 0.0–0.2)

## 2021-11-05 LAB — GLUCOSE, CAPILLARY
Glucose-Capillary: 109 mg/dL — ABNORMAL HIGH (ref 70–99)
Glucose-Capillary: 115 mg/dL — ABNORMAL HIGH (ref 70–99)
Glucose-Capillary: 84 mg/dL (ref 70–99)
Glucose-Capillary: 101 mg/dL — ABNORMAL HIGH (ref 70–99)

## 2021-11-05 LAB — BASIC METABOLIC PANEL
Anion gap: 3 — ABNORMAL LOW (ref 5–15)
BUN: 5 mg/dL — ABNORMAL LOW (ref 8–23)
CO2: 24 mmol/L (ref 22–32)
Calcium: 7.9 mg/dL — ABNORMAL LOW (ref 8.9–10.3)
Chloride: 113 mmol/L — ABNORMAL HIGH (ref 98–111)
Creatinine, Ser: 0.61 mg/dL (ref 0.44–1.00)
GFR, Estimated: >60 mL/min (ref >60)
Glucose, Bld: 102 mg/dL — ABNORMAL HIGH (ref 70–99)
Potassium: 4.2 mmol/L (ref 3.5–5.1)
Sodium: 140 mmol/L (ref 135–145)

## 2021-11-05 LAB — RESP PANEL BY RT-PCR (FLU A&B, COVID) ARPGX2
Influenza A by PCR: NEGATIVE
Influenza B by PCR: NEGATIVE
SARS Coronavirus 2 by RT PCR: NEGATIVE

## 2021-11-05 LAB — CULTURE, BLOOD (ROUTINE X 2)
Culture: NO GROWTH
Special Requests: ADEQUATE

## 2021-11-05 MED ORDER — METOPROLOL TARTRATE 50 MG PO TABS: 50.0000 mg | ORAL_TABLET | Freq: Two times a day (BID) | ORAL | Status: DC

## 2021-11-05 MED ORDER — METOPROLOL TARTRATE 25 MG PO TABS
25.0000 mg | ORAL_TABLET | Freq: Once | ORAL | Status: AC
  Administered 2021-11-05: 25 mg via ORAL
  Filled 2021-11-05: qty 1

## 2021-11-05 MED ORDER — METOPROLOL SUCCINATE ER 50 MG PO TB24
50.0000 mg | ORAL_TABLET | Freq: Two times a day (BID) | ORAL | Status: DC
  Administered 2021-11-05 – 2021-11-06 (×2): 50 mg via ORAL
  Filled 2021-11-05 (×2): qty 1

## 2021-11-05 MED ORDER — GERHARDT'S BUTT CREAM
TOPICAL_CREAM | Freq: Three times a day (TID) | CUTANEOUS | Status: DC
  Filled 2021-11-05: qty 1

## 2021-11-05 NOTE — Plan of Care (Signed)
  Problem: Education: Goal: Knowledge of General Education information will improve Description: Including pain rating scale, medication(s)/side effects and non-pharmacologic comfort measures Outcome: Progressing   Problem: Safety: Goal: Ability to remain free from injury will improve Outcome: Progressing   

## 2021-11-05 NOTE — Progress Notes (Addendum)
?PROGRESS NOTE ? ?Sara Merritt TOI:[id number] DOB: 10-19-1929 DOA: 10/31/2021 ?PCP: Marco Collie, MD ? ? LOS: 4 days  ? ?Brief narrative: ? ?Sara Merritt is a 88 y.o. female with medical history significant of breast cancer status post lumpectomy, hypertension, CKD, atrial fibrillation, hypothyroidism, diabetes mellitus with tachycardia presented to the hospital feeling unwell, fatigue and weakness with dizziness on standing.  EMS was called in and patient was noted to be tachycardic in the 200s with hypotension.    In the ED, patient had transient hypotension after receiving Cardizem drip so was started on amiodarone drip.  Initial labs showed a potassium of 3.2, INR at the 1.6 and lactate elevated at 2.2 and 2.4.  Chest x-ray showed cardiomegaly.  Patient also received 500 cc of fluid in the ED and 30 mEq of potassium and was admitted hospital for further evaluation and treatment.   ? ?During hospitalization, patient remained hypotensive and required multiple fluid boluses.  Blood culture was positive in 1 bottle for staph so vancomycin was initiated.  Patient was also given digoxin 0.25 mg x 3 .  Cardiology followed the patient during hospitalization and was continued on amiodarone drip with subsequent change to metoprolol. ? ?Assessment/Plan: ? ?Principal Problem: ?  Atrial fibrillation with rapid ventricular response ([place]) ?Active Problems: ?  HTN (hypertension) ?  Hypothyroidism ?  Diabetes ([place]) ?  GERD (gastroesophageal reflux disease) ?  Hypokalemia ?  Leg pain, diffuse, left ?  Bacteremia ?  Acute deep vein thrombosis (DVT) of proximal vein of lower extremity ([facility]) ? ? ?Atrial fibrillation with RVR ?On presentation initial RVR up to 200s with hypotension.  Still tachycardic on ambulation up to 160s.  Initially required amiodarone drip which was discontinued and was put on metoprolol 25 twice daily.   2D echocardiogram showed LV ejection fraction of 55 to 60% with no regional wall motion  abnormality with severely elevated pulmonary artery systolic pressure.  CHA2DS2-VASc score of 5.  On Eliquis at this time.  Was switched from Coumadin.  Follow cardiology recommendations. ? ?Lower extremity pain.  Secondary to DVT.  Ultrasound of the lower extremity showed evidence of acute DVT involving the right popliteal vein, indeterminate left femoral and proximal profunda DVT.  Patient was supposed to be on Coumadin as outpatient but was subtherapeutic.  Coumadin has been changed to Eliquis at this time. ? ?Gram-positive bacterial culture x 1 bottle,  Staph hemolyticus.  Likely contaminant. No true bacteremia. Off antibiotic at this time.  Afebrile. No leukocytosis ? ?Lactic acidosis. Improved with IV fluids.  ? ?Hypokalemia ?Improved after replacement.  Latest potassium of 4.2.  Goal more than 4 for ?  ?Hypothyroidism ?Continue Synthroid. ? ?Hypertension ?Currently improved blood pressure, had hypotension on presentation and required IV fluid bolus and amiodarone, beta-blockers have been titrated.  Blood cultures been negative in 5 days. ? ?Diabetes mellitus type II ?Continue sliding scale insulin for now.  Overall controlled. ? ?GERD ?Continue PPI. ? ?Hyperlipidemia ?Simvastatin has been changed to atorvastatin ? ?Hypomagnesemia. ?Improved after replacement.  Latest magnesium of 1.7.  Oral magnesium oxide ? ?Poor IV access.  Required PICC line. ? ?Deconditioning, debility PT OT has seen the patient at this time.  From assisted living facility.  Will need home health services assisted living facility on discharge. ? ?Disposition likely to assisted living facility with home health services on discharge.  Spoke with the [facility] team. ? ?DVT prophylaxis: Place TED hose Start: 11/03/21 1347 ?apixaban (ELIQUIS) tablet 5 mg  ? ? ?  Code Status: Full code ? ?Family Communication:  ?Spoke with the patient's brother at bedside on 11/04/2021 and with the patient's daughter on 11/05/2021 ? ?Status is: Inpatient ? ?The patient  is  inpatient because: Atrial fibrillation with RVR, new DVT, cardiology follow-up ?  ?Consultants: ?Cardiology ? ?Procedures: ?None ? ?Anti-infectives: ? ?None ? ?Anti-infectives (From admission, onward)  ? ? Start     Dose/Rate Route Frequency Ordered Stop  ? 11/01/21 2030  ceFAZolin (ANCEF) IVPB 2g/100 mL premix  Status:  Discontinued       ? 2 g ?200 mL/hr over 30 Minutes Intravenous Every 8 hours 11/01/21 1947 11/03/21 1044  ? 11/01/21 2000  ceFAZolin (ANCEF) IVPB 2g/100 mL premix  Status:  Discontinued       ? 2 g ?200 mL/hr over 30 Minutes Intravenous 2 times daily 11/01/21 1848 11/01/21 1947  ? ?  ? ? ?Subjective: ?Today, patient was seen and examined at bedside.  Was tachycardic on exertion.  Complains of mild leg discomfort bilaterally.  No increasing shortness of breath chest pain or dyspnea.  Patient's daughter at bedside. ? ?Objective: ?Vitals:  ? 11/05/21 0755 11/05/21 1009  ?BP: 127/85 120/66  ?Pulse: (!) 106 (!) 131  ?Resp: 18 15  ?Temp: 98.2 ?F (36.8 ?C)   ?SpO2: 94% 97%  ? ?No intake or output data in the 24 hours ending 11/05/21 1041 ? ?Filed Weights  ? 10/31/21 2152 11/02/21 0339 11/03/21 0500  ?Weight: 75.5 kg 80.8 kg 81.1 kg  ? ?Body mass index is 30.69 kg/m?.  ? ?Physical Exam: ? ?General: Obese built, not in obvious distress ?HENT:   No scleral pallor or icterus noted. Oral mucosa is moist.  ?Chest:  Clear breath sounds.  Diminished breath sounds bilaterally. No crackles or wheezes.  ?CVS: S1 &S2 heard. No murmur.  Irregularly irregular rhythm ?Abdomen: Soft, nontender, nondistended.  Bowel sounds are heard.   ?Extremities: No cyanosis, clubbing with bilateral lower extremity tenderness, peripheral pulses are palpable. ?Psych: Alert, awake and oriented, normal mood ?CNS:  No cranial nerve deficits.  Moving all extremities. ?Skin: Warm and dry.  No rashes noted. ? ?Data Review: I have personally reviewed the following laboratory data and studies, ? ?CBC: ?Recent Labs  ?Lab 10/31/21 ?1425  11/01/21 ?0428 11/02/21 ?7517 11/04/21 ?0017 11/05/21 ?0416  ?WBC 8.4 6.8 7.4 5.4 5.5  ?NEUTROABS 6.4  --   --   --   --   ?HGB 13.5 11.9* 11.3* 11.0* 11.4*  ?HCT 39.9 36.1 35.1* 33.0* 34.5*  ?MCV 103.9* 106.5* 107.7* 105.8* 105.8*  ?PLT 197 142* 123* 113* 129*  ? ? ?Basic Metabolic Panel: ?Recent Labs  ?Lab 10/31/21 ?2142 11/01/21 ?4944 11/02/21 ?0244 11/03/21 ?9675 11/04/21 ?9163 11/05/21 ?8466  ?NA  --  139 138 136 136 140  ?K  --  3.9 4.9 4.3 3.7 4.2  ?CL  --  107 111 110 107 113*  ?CO2  --  23 21* 21* 24 24  ?GLUCOSE  --  106* 104* 123* 189* 102*  ?BUN  --  27* 16 7* 5* 5*  ?CREATININE  --  0.86 0.74 0.73 0.65 0.61  ?CALCIUM  --  8.4* 8.2* 8.1* 7.9* 7.9*  ?MG 1.5*  --  1.9  --  1.7  --   ? ? ?Liver Function Tests: ?Recent Labs  ?Lab 11/02/21 ?5993 11/04/21 ?5701  ?AST 28 21  ?ALT 19 7  ?ALKPHOS 48 49  ?BILITOT 0.4 0.4  ?PROT 4.6* 4.3*  ?ALBUMIN 2.1* 1.8*  ? ? ?  No results for input(s): LIPASE, AMYLASE in the last 168 hours. ?No results for input(s): AMMONIA in the last 168 hours. ?Cardiac Enzymes: ?No results for input(s): CKTOTAL, CKMB, CKMBINDEX, TROPONINI in the last 168 hours. ?BNP (last 3 results) ?No results for input(s): BNP in the last 8760 hours. ? ?ProBNP (last 3 results) ?No results for input(s): PROBNP in the last 8760 hours. ? ?CBG: ?Recent Labs  ?Lab 11/04/21 ?0738 11/04/21 ?1121 11/04/21 ?1543 11/04/21 ?2009 11/05/21 ?0757  ?GLUCAP 104* 113* 105* 111* 109*  ? ? ?Recent Results (from the past 240 hour(s))  ?Culture, blood (Routine X 2) w Reflex to ID Panel     Status: Abnormal  ? Collection Time: 10/31/21  2:20 PM  ? Specimen: BLOOD  ?Result Value Ref Range Status  ? Specimen Description BLOOD RIGHT ANTECUBITAL  Final  ? Special Requests   Final  ?  BOTTLES DRAWN AEROBIC AND ANAEROBIC Blood Culture results may not be optimal due to an excessive volume of blood received in culture bottles  ? Culture  Setup Time   Final  ?  GRAM POSITIVE COCCI IN CLUSTERS ?BOTTLES DRAWN AEROBIC ONLY ?CRITICAL RESULT  CALLED TO, READ BACK BY AND VERIFIED WITH: PHARMD T DANG [id number] FCP ?  ? Culture (A)  Final  ?  STAPHYLOCOCCUS HAEMOLYTICUS ?THE SIGNIFICANCE OF ISOLATING THIS ORGANISM FROM A SINGLE SET OF BLOOD CULT

## 2021-11-05 NOTE — Care Management Important Message (Signed)
Important Message ? ?Patient Details  ?Name: Sara Merritt ?MRN: [id number] ?Date of Birth: [birth year] ? ? ?Medicare Important Message Given:  Yes ? ? ? ? ?Levada Dy  Charvez Voorhies-Martin ?11/05/2021, 3:30 PM ?

## 2021-11-05 NOTE — TOC Initial Note (Addendum)
Transition of Care (TOC) - Initial/Assessment Note  ? ? ?Patient Details  ?Name: Sara Merritt ?MRN: [id number] ?Date of Birth: 1930/07/27 ? ?Transition of Care (TOC) CM/SW Contact:    ?Milas Gain, LCSWA ?Phone Number: ?11/05/2021, 12:19 PM ? ?Clinical Narrative:         ? ?Update- CSW received callback from [facility] with [place] [facility]. [employer] orders,DC summary, and covid to Fax#  Att to: Cherlyn Cushing [phone] when medically ready for dc. CSW will continue to follow and assist with pateints dc planning needs. CSW will continue to follow.     ? ?CSW spoke with patients daughter Sara Merritt regarding DC plan. Patients daughter is in agreement with PT recommendation for [facility] services at ALF for patient. Patients daughter confirms plan is for patient to return to [facility] with home health orders. CSW called [facility]. CSW awaiting callback to confirm plan. CSW will continue to follow and assist with patients dc planning needs. ? ?Expected Discharge Plan: Assisted Living (back to [place]) ?Barriers to Discharge: Continued Medical Work up ? ? ?Patient Goals and CMS Choice ?  ?CMS Medicare.gov Compare Post Acute Care list provided to:: Patient Represenative (must comment) (patients daughter Sara Merritt) ?Choice offered to / list presented to : Adult Children (Patients daughter Sara Merritt) ? ?Expected Discharge Plan and Services ?Expected Discharge Plan: Assisted Living (back to [place]) ?In-house Referral: Clinical Social Work ?  ?  ?Living arrangements for the past 2 months: [place] ([facility]) ?                ?  ?  ?  ?  ?  ?  ?  ?  ?  ?  ? ?Prior Living Arrangements/Services ?Living arrangements for the past 2 months: [place] ([facility]) ?Lives with:: Facility Resident ?Patient language and need for interpreter reviewed:: Yes ?Do you feel safe going back to the place where you live?: Yes      ?Need for Family Participation in Patient Care: Yes (Comment) ?Care giver  support system in place?: Yes (comment) ?  ?Criminal Activity/Legal Involvement Pertinent to Current Situation/Hospitalization: No - Comment as needed ? ?Activities of Daily Living ?Home Assistive Devices/Equipment: Wheelchair ?ADL Screening (condition at time of admission) ?Patient's cognitive ability adequate to safely complete daily activities?: Yes ?Is the patient deaf or have difficulty hearing?: No ?Does the patient have difficulty seeing, even when wearing glasses/contacts?: No ?Does the patient have difficulty concentrating, remembering, or making decisions?: No ?Patient able to express need for assistance with ADLs?: Yes ?Does the patient have difficulty dressing or bathing?: Yes ?Independently performs ADLs?: No ?Communication: Independent ?Dressing (OT): Needs assistance ?Is this a change from baseline?: Pre-admission baseline ?Grooming: Needs assistance ?Is this a change from baseline?: Pre-admission baseline ?Feeding: Needs assistance ?Is this a change from baseline?: Pre-admission baseline ?Bathing: Dependent ?Is this a change from baseline?: Pre-admission baseline ?In/Out Bed: Dependent ?Is this a change from baseline?: Pre-admission baseline ?Walks in Home: Dependent ?Is this a change from baseline?: Pre-admission baseline ?Does the patient have difficulty walking or climbing stairs?: Yes ?Weakness of Legs: Both ?Weakness of Arms/Hands: Both ? ?Permission Sought/Granted ?Permission sought to share information with : Case Manager, Family Supports, Customer service manager ?Permission granted to share information with : No ? Share Information with NAME: Only oriented to person and place spoke with patients daughter Sara Merritt ? Permission granted to share info w AGENCY: Only oriented to person and place spoke with patients daughter Les/SNF ? Permission granted to share  info w Relationship: Only oriented to person and place spoke with patients daughter Sara Merritt ? Permission granted to share info w Contact  Information: Only oriented to person and place spoke with patients daughter Sara Merritt [phone] ? ?Emotional Assessment ?Appearance:: Appears stated age ?  ?  ?Orientation: : Oriented to Self, Oriented to Place ?Alcohol / Substance Use: Not Applicable ?Psych Involvement: No (comment) ? ?Admission diagnosis:  Atrial fibrillation with RVR ([place]) [I48.91] ?Patient Active Problem List  ? Diagnosis Date Noted  ? Acute deep vein thrombosis (DVT) of proximal vein of lower extremity (HCC)   ? Leg pain, diffuse, left 11/02/2021  ? Bacteremia 11/02/2021  ? Atrial fibrillation with rapid ventricular response ([place]) 10/31/2021  ? HTN (hypertension) 10/31/2021  ? Hypothyroidism 10/31/2021  ? Diabetes ([place]) 10/31/2021  ? GERD (gastroesophageal reflux disease) 10/31/2021  ? Hypokalemia 10/31/2021  ? 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 ?PCP:  Marco Collie, MD ?Pharmacy:  No Pharmacies Listed ? ? ? ?Social Determinants of Health (SDOH) Interventions ?  ? ?Readmission Risk Interventions ?   ? View : No data to display.  ?  ?  ?  ? ? ? ?

## 2021-11-05 NOTE — Progress Notes (Addendum)
? ?Progress Note ? ?Patient Name: Sara Merritt ?Date of Encounter: 11/05/2021 ? ?[facility] Cardiologist: Elouise Munroe, MD  ? ?Subjective  ? ?Up in chair and rapid HR up to 200 briefly a fib.  No chest pain and no SOB ? ?Inpatient Medications  ?  ?Scheduled Meds: ? allopurinol  200 mg Oral Daily  ? apixaban  5 mg Oral BID  ? atorvastatin  10 mg Oral Daily  ? calcium carbonate  1,250 mg Oral Daily  ? Chlorhexidine Gluconate Cloth  6 each Topical Daily  ? feeding supplement  1 Container Oral TID BM  ? insulin aspart  0-9 Units Subcutaneous TID WC  ? levothyroxine  75 mcg Oral Daily  ? magnesium oxide  400 mg Oral BID  ? metoprolol tartrate  25 mg Oral TID  ? pantoprazole  40 mg Oral Daily  ? sodium chloride flush  10-40 mL Intracatheter Q12H  ? ?Continuous Infusions: ? ?PRN Meds: ?acetaminophen, ondansetron (ZOFRAN) IV, sodium chloride flush  ? ?Vital Signs  ?  ?Vitals:  ? 11/04/21 1636 11/04/21 2006 11/04/21 2357 11/05/21 0404  ?BP: 97/64 (!) 102/50 (!) 102/55 110/65  ?Pulse:  93 90 100  ?Resp: (!) 21 (!) 23 18 (!) 21  ?Temp:  98.5 ?F (36.9 ?C) 98.4 ?F (36.9 ?C) 98.2 ?F (36.8 ?C)  ?TempSrc:  Oral Oral Oral  ?SpO2:  100% 94% 95%  ?Weight:      ?Height:      ? ? ?Intake/Output Summary (Last 24 hours) at 11/05/2021 0736 ?Last data filed at 11/04/2021 1012 ?Gross per 24 hour  ?Intake 250 ml  ?Output --  ?Net 250 ml  ? ? ?  11/03/2021  ?  5:00 AM 11/02/2021  ?  3:39 AM 10/31/2021  ?  9:52 PM  ?Last 3 Weights  ?Weight (lbs) 178 lb 12.7 oz 178 lb 2.1 oz 166 lb 7.2 oz  ?Weight (kg) 81.1 kg 80.8 kg 75.5 kg  ?   ? ?Telemetry  ?  ?Atrial fib  - Personally Reviewed ? ?ECG  ?  ? No new - Personally Reviewed ? ?Physical Exam  ? ?GEN: No acute distress.   ?Neck: No JVD ?Cardiac: irreg ireg and rapid, no murmurs, rubs, or gallops.  ?Respiratory: Clear to auscultation bilaterally. ?GI: Soft, nontender, non-distended  ?MS: No edema; No deformity. ?Neuro:  Nonfocal  ?Psych: Normal affect  ? ?Labs  ?  ?High Sensitivity  Troponin:  No results for input(s): TROPONINIHS in the last 720 hours.   ?Chemistry ?Recent Labs  ?Lab 10/31/21 ?2142 11/01/21 ?3875 11/02/21 ?0244 11/03/21 ?6433 11/04/21 ?2951 11/05/21 ?8841  ?NA  --    < > 138 136 136 140  ?K  --    < > 4.9 4.3 3.7 4.2  ?CL  --    < > 111 110 107 113*  ?CO2  --    < > 21* 21* 24 24  ?GLUCOSE  --    < > 104* 123* 189* 102*  ?BUN  --    < > 16 7* 5* 5*  ?CREATININE  --    < > 0.74 0.73 0.65 0.61  ?CALCIUM  --    < > 8.2* 8.1* 7.9* 7.9*  ?MG 1.5*  --  1.9  --  1.7  --   ?PROT  --   --  4.6*  --  4.3*  --   ?ALBUMIN  --   --  2.1*  --  1.8*  --   ?  AST  --   --  28  --  21  --   ?ALT  --   --  19  --  7  --   ?ALKPHOS  --   --  48  --  49  --   ?BILITOT  --   --  0.4  --  0.4  --   ?GFRNONAA  --    < > >60 >60 >60 >60  ?ANIONGAP  --    < > '6 5 5 '$ 3*  ? < > = values in this interval not displayed.  ?  ?Lipids No results for input(s): CHOL, TRIG, HDL, LABVLDL, LDLCALC, CHOLHDL in the last 168 hours.  ?Hematology ?Recent Labs  ?Lab 11/02/21 ?9242 11/04/21 ?0348 11/05/21 ?0416  ?WBC 7.4 5.4 5.5  ?RBC 3.26* 3.12* 3.26*  ?HGB 11.3* 11.0* 11.4*  ?HCT 35.1* 33.0* 34.5*  ?MCV 107.7* 105.8* 105.8*  ?MCH 34.7* 35.3* 35.0*  ?MCHC 32.2 33.3 33.0  ?RDW 14.3 14.3 14.2  ?PLT 123* 113* 129*  ? ?Thyroid  ?Recent Labs  ?Lab 11/03/21 ?0751  ?TSH 6.793*  ?FREET4 1.35*  ?  ?BNPNo results for input(s): BNP, PROBNP in the last 168 hours.  ?DDimer No results for input(s): DDIMER in the last 168 hours.  ? ?Radiology  ?  ?DG CHEST PORT 1 VIEW ? ?Addendum Date: 11/03/2021   ?ADDENDUM REPORT: 11/03/2021 10:37 ADDENDUM: New PICC line placement. On the current exam there is a right-sided PICC. Tip projects in the lower superior vena cava, well positioned. Electronically Signed   By: Lajean Manes M.D.   On: 11/03/2021 10:37  ? ?Result Date: 11/03/2021 ?CLINICAL DATA:  Tachycardia.  Atrial fibrillation.  Follow-up exam. EXAM: PORTABLE CHEST 1 VIEW COMPARISON:  10/31/2021 and older exams. FINDINGS: Mild enlargement of  the cardiopericardial silhouette. Large hiatal hernia superimposed on the cardiac silhouette, stable. No mediastinal or hilar masses. Mild lung base opacities, left greater than right, consistent with atelectasis or scarring. Lungs otherwise clear. No convincing pleural effusion. No pneumothorax. Left-sided rib fractures, at least the left lateral 6, seventh and eighth ribs, which appear recent. IMPRESSION: 1. No acute cardiopulmonary disease. 2. Stable cardiomegaly.  Stable large hiatal hernia. 3. Left-sided rib fractures. These are stable compared to the CT from 09/14/2021. Electronically Signed: By: Lajean Manes M.D. On: 11/03/2021 10:26  ? ?[place] EKG SITE RITE ? ?Result Date: 11/03/2021 ?If [employer] not attached, placement could not be confirmed due to current cardiac rhythm.  ? ?Cardiac Studies  ? ?TTE 11/01/21 ?IMPRESSIONS  ? ? ? 1. Left ventricular ejection fraction, by estimation, is 55 to 60%. The  ?left ventricle has normal function. The left ventricle has no regional  ?wall motion abnormalities. Left ventricular diastolic parameters are  ?indeterminate. Elevated left ventricular  ?end-diastolic pressure.  ? 2. Right ventricular systolic function is normal. The right ventricular  ?size is moderately enlarged. There is severely elevated pulmonary artery  ?systolic pressure.  ? 3. Left atrial size was severely dilated.  ? 4. Right atrial size was severely dilated.  ? 5. The mitral valve is normal in structure. Trivial mitral valve  ?regurgitation. No evidence of mitral stenosis. Moderate mitral annular  ?calcification.  ? 6. Tricuspid valve regurgitation is moderate.  ? 7. The aortic valve is calcified. There is moderate calcification of the  ?aortic valve. There is moderate thickening of the aortic valve. Aortic  ?valve regurgitation is trivial. Moderate aortic valve stenosis.  ? 8. The inferior vena cava is  normal in size with greater than 50%  ?respiratory variability, suggesting right atrial pressure  of 3 mmHg.  ? ?FINDINGS  ? Left Ventricle: Left ventricular ejection fraction, by estimation, is 55  ?to 60%. The left ventricle has normal function. The left ventricle has no  ?regional wall motion abnormalities. The left ventricular internal cavity  ?size was normal in size. There is  ? no left ventricular hypertrophy. Left ventricular diastolic function  ?could not be evaluated due to atrial fibrillation. Left ventricular  ?diastolic parameters are indeterminate. Elevated left ventricular  ?end-diastolic pressure.  ? ?Right Ventricle: The right ventricular size is moderately enlarged. No  ?increase in right ventricular wall thickness. Right ventricular systolic  ?function is normal. There is severely elevated pulmonary artery systolic  ?pressure. The tricuspid regurgitant  ?velocity is 4.61 m/s, and with an assumed right atrial pressure of 3 mmHg,  ?the estimated right ventricular systolic pressure is 49.1 mmHg.  ? ?Left Atrium: Left atrial size was severely dilated.  ? ?Right Atrium: Right atrial size was severely dilated.  ? ?Pericardium: There is no evidence of pericardial effusion.  ? ?Mitral Valve: The mitral valve is normal in structure. Moderate mitral  ?annular calcification. Trivial mitral valve regurgitation. No evidence of  ?mitral valve stenosis.  ? ?Tricuspid Valve: The tricuspid valve is normal in structure. Tricuspid  ?valve regurgitation is moderate . No evidence of tricuspid stenosis.  ? ?Aortic Valve: The aortic valve is calcified. There is moderate  ?calcification of the aortic valve. There is moderate thickening of the  ?aortic valve. Aortic valve regurgitation is trivial. Aortic regurgitation  ?PHT measures 388 msec. Moderate aortic  ?stenosis is present. Aortic valve mean gradient measures 16.5 mmHg. Aortic  ?valve peak gradient measures 26.7 mmHg. Aortic valve area, by VTI measures  ?0.95 cm?.  ? ?Pulmonic Valve: The pulmonic valve was normal in structure. Pulmonic valve  ?regurgitation is  not visualized. No evidence of pulmonic stenosis.  ? ?Aorta: The aortic root is normal in size and structure.  ? ?Venous: The inferior vena cava is normal in size with greater than 50%  ?respiratory vari

## 2021-11-05 NOTE — Progress Notes (Signed)
Physical Therapy Treatment ?Patient Details ?Name: Sara Merritt ?MRN: [id number] ?DOB: 01/07/1930 ?Today's Date: 11/05/2021 ? ? ?History of Present Illness 88 y.o. female  adm 3/22 with fatigue, weakness and dizziness. Pt tachycardic with hypotension in Afib with RVR. PMH:  breast CA, HTN, CKD, Afib, hypothyroidism, DM, and tachycardia. ? ?  ?PT Comments  ? ? Pt very pleasant and eager to attempt mobility. Pt reported slight dizziness with sitting up but not prolonged and no symptoms with standing. Pt with maintained Afib with HR 120-150 at rest and HR up to 172 with standing. Pt with return to 135 at rest after transfer, asymptomatic throughout and cardiology present during session. Pt very happy to be OOB and wanting to progress with +2 assist toward gait as able when HR more controlled. Pt educated for HEP and encouraged to be OOB with nursing staff. Will continue to follow.  ? ?BP 120/66 (82) sitting ?HR 135-172 ?   ?Recommendations for follow up therapy are one component of a multi-disciplinary discharge planning process, led by the attending physician.  Recommendations may be updated based on patient status, additional functional criteria and insurance authorization. ? ?Follow Up Recommendations ? Home health PT (At ALF) ?  ?  ?Assistance Recommended at Discharge Frequent or constant Supervision/Assistance  ?Patient can return home with the following A lot of help with walking and/or transfers;A lot of help with bathing/dressing/bathroom;Help with stairs or ramp for entrance;Assist for transportation;Assistance with cooking/housework;Assistance with feeding;Direct supervision/assist for medications management;Direct supervision/assist for financial management ?  ?Equipment Recommendations ? None recommended by PT  ?  ?Recommendations for Other Services   ? ? ?  ?Precautions / Restrictions Precautions ?Precautions: Fall;Other (comment) ?Precaution Comments: watch HR and BP  ?  ? ?Mobility ? Bed  Mobility ?Overal bed mobility: Needs Assistance ?Bed Mobility: Supine to Sit ?  ?  ?Supine to sit: Mod assist, HOB elevated ?  ?  ?General bed mobility comments: HOb 30 degrees with use of rail and HHA to elevate trunk to sitting. mod assist with pad to scoot hips to EOB with cue for sequence ?  ? ?Transfers ?Overall transfer level: Needs assistance ?  ?Transfers: Sit to/from Stand, Bed to chair/wheelchair/BSC ?Sit to Stand: Min assist, +2 physical assistance ?Stand pivot transfers: Mod assist, +2 physical assistance ?  ?  ?  ?  ?General transfer comment: min +2 with bil UE hooking and left knee blocked to stand from bed x 2 trials with mod assist for balance and weight shift to pivot to chair max HR 172 with pivot ?  ? ?Ambulation/Gait ?  ?  ?  ?  ?  ?  ?  ?General Gait Details: unable ? ? ?Stairs ?  ?  ?  ?  ?  ? ? ?Wheelchair Mobility ?  ? ?Modified Rankin (Stroke Patients Only) ?  ? ? ?  ?Balance Overall balance assessment: Needs assistance ?  ?Sitting balance-Leahy Scale: Fair ?Sitting balance - Comments: EOB without UE support ?  ?Standing balance support: Bilateral upper extremity supported ?Standing balance-Leahy Scale: Poor ?Standing balance comment: bil UE support in standing ?  ?  ?  ?  ?  ?  ?  ?  ?  ?  ?  ?  ? ?  ?Cognition Arousal/Alertness: Awake/alert ?Behavior During Therapy: [facility] for tasks assessed/performed ?Overall Cognitive Status: Within Functional Limits for tasks assessed ?  ?  ?  ?  ?  ?  ?  ?  ?  ?  ?  ?  ?  ?  ?  ?  ?  General Comments: Very pleasant and willing to mobilize, fear of falling and prefers +2 assist for all transfers, progressive mobility ?  ?  ? ?  ?Exercises General Exercises - Lower Extremity ?Long Arc Quad: AROM, Both, Seated, 15 reps ?Hip ABduction/ADduction: AROM, Both, 15 reps, Seated ?Hip Flexion/Marching: AROM, Both, 15 reps, Seated ? ?  ?General Comments   ?  ?  ? ?Pertinent Vitals/Pain Pain Assessment ?Pain Assessment: No/denies pain  ? ? ?Home Living   ?  ?  ?  ?  ?   ?  ?  ?  ?  ?   ?  ?Prior Function    ?  ?  ?   ? ?PT Goals (current goals can now be found in the care plan section) Progress towards PT goals: Progressing toward goals ? ?  ?Frequency ? ? ? Min 3X/week ? ? ? ?  ?PT Plan Current plan remains appropriate  ? ? ?Co-evaluation   ?  ?  ?  ?  ? ?  ?AM-PAC PT "6 Clicks" Mobility   ?Outcome Measure ? Help needed turning from your back to your side while in a flat bed without using bedrails?: A Little ?Help needed moving from lying on your back to sitting on the side of a flat bed without using bedrails?: A Little ?Help needed moving to and from a bed to a chair (including a wheelchair)?: A Lot ?Help needed standing up from a chair using your arms (e.g., wheelchair or bedside chair)?: Total ?Help needed to walk in hospital room?: Total ?Help needed climbing 3-5 steps with a railing? : Total ?6 Click Score: 11 ? ?  ?End of Session Equipment Utilized During Treatment: Gait belt ?Activity Tolerance: Other (comment) (limited by Afib) ?Patient left: in chair;with call bell/phone within reach;with chair alarm set;with family/visitor present ?Nurse Communication: Mobility status ?PT Visit Diagnosis: Muscle weakness (generalized) (M62.81);Other abnormalities of gait and mobility (R26.89) ?  ? ? ?Time: 3790-2409 ?PT Time Calculation (min) (ACUTE ONLY): 27 min ? ?Charges:  $Therapeutic Exercise: 8-22 mins ?$Therapeutic Activity: 8-22 mins          ?          ? ?Augusten Lipkin P, PT ?Acute Rehabilitation Services ?Pager: [phone] ?Office: [phone] ? ? ? ?Zymir Napoli B Melyssa Signor ?11/05/2021, 10:48 AM ? ?

## 2021-11-05 NOTE — Progress Notes (Signed)
Speech Language Pathology Treatment: Dysphagia  ?Patient Details ?Name: Sara Merritt ?MRN: [id number] ?DOB: [birth year] ?Today's Date: 11/05/2021 ?Time: 0160-1093 ?SLP Time Calculation (min) (ACUTE ONLY): 13 min ? ?Assessment / Plan / Recommendation ?Clinical Impression ? Pt alert, visiting with daughter, Sara Merritt, at bedside.  Sara Merritt reports that oral intake remains limited.  Sara Merritt was observed eating crackers with water and Ensure and demonstrated a functional oropharyngeal swallow with no s/s aspiration. There are no current c/o esophageal issues. She reports intermittent issues with food "swelling" in her mouth and having to spit it out. Prior SLP at [facility] worked with her to alternate sips of liquids with bites of solids. Her motivation to eat is poor, but the biomechanics of her swallow appear to be functional. Recommend continuing regular diet, thin liquids to optimize choices. No further SLP needs are identified - our service will sign off. ?  ?HPI HPI: Sara Merritt is an 88 y.o. female  presented to the hospital feeling unwell, fatigue and weakness with dizziness on standing.  EMS was called in and patient was noted to be tachycardic in the 200s with hypotension.    In the ED, patient had transient hypotension after receiving Cardizem drip so was started on amiodarone drip. PMH:  breast cancer status post lumpectomy, hypertension, CKD, atrial fibrillation, hypothyroidism, diabetes mellitus with tachycardia ?  ?   ?SLP Plan ? All goals met ? ?  ?  ?Recommendations for follow up therapy are one component of a multi-disciplinary discharge planning process, led by the attending physician.  Recommendations may be updated based on patient status, additional functional criteria and insurance authorization. ?  ? ?Recommendations  ?Diet recommendations: Regular;Thin liquid ?Liquids provided via: Straw ?Medication Administration: Whole meds with liquid ?Supervision: Patient able to self  feed ?Compensations: Slow rate;Small sips/bites ?Postural Changes and/or Swallow Maneuvers: Seated upright 90 degrees  ?   ?    ?   ? ? ? ? Oral Care Recommendations: Oral care BID ?Follow Up Recommendations: No SLP follow up ?Assistance recommended at discharge: None ?SLP Visit Diagnosis: Dysphagia, unspecified (R13.10) ?Plan: All goals met ? ? ? ? ?  ?  ? ?Sara Samara L. Rainn Zupko, MA CCC/SLP ?Acute Rehabilitation Services ?Office number [phone] ?Pager [phone] ? ?Sara Merritt ? ?11/05/2021, 4:35 PM ?

## 2021-11-06 DIAGNOSIS — K219 Gastro-esophageal reflux disease without esophagitis: Secondary | ICD-10-CM | POA: Diagnosis not present

## 2021-11-06 DIAGNOSIS — I824Y3 Acute embolism and thrombosis of unspecified deep veins of proximal lower extremity, bilateral: Secondary | ICD-10-CM | POA: Diagnosis not present

## 2021-11-06 DIAGNOSIS — I4891 Unspecified atrial fibrillation: Secondary | ICD-10-CM | POA: Diagnosis not present

## 2021-11-06 DIAGNOSIS — R7881 Bacteremia: Secondary | ICD-10-CM | POA: Diagnosis not present

## 2021-11-06 LAB — CBC
HCT: 33.5 % — ABNORMAL LOW (ref 36.0–46.0)
Hemoglobin: 11.2 g/dL — ABNORMAL LOW (ref 12.0–15.0)
MCH: 35.4 pg — ABNORMAL HIGH (ref 26.0–34.0)
MCHC: 33.4 g/dL (ref 30.0–36.0)
MCV: 106 fL — ABNORMAL HIGH (ref 80.0–100.0)
Platelets: 140 10*3/uL — ABNORMAL LOW (ref 150–400)
RBC: 3.16 MIL/uL — ABNORMAL LOW (ref 3.87–5.11)
RDW: 14.3 % (ref 11.5–15.5)
WBC: 5.5 10*3/uL (ref 4.0–10.5)
nRBC: 0 % (ref 0.0–0.2)

## 2021-11-06 LAB — GLUCOSE, CAPILLARY
Glucose-Capillary: 103 mg/dL — ABNORMAL HIGH (ref 70–99)
Glucose-Capillary: 100 mg/dL — ABNORMAL HIGH (ref 70–99)
Glucose-Capillary: 103 mg/dL — ABNORMAL HIGH (ref 70–99)
Glucose-Capillary: 144 mg/dL — ABNORMAL HIGH (ref 70–99)

## 2021-11-06 LAB — BASIC METABOLIC PANEL
Anion gap: 4 — ABNORMAL LOW (ref 5–15)
BUN: <5 mg/dL — ABNORMAL LOW (ref 8–23)
CO2: 25 mmol/L (ref 22–32)
Calcium: 8.1 mg/dL — ABNORMAL LOW (ref 8.9–10.3)
Chloride: 109 mmol/L (ref 98–111)
Creatinine, Ser: 0.58 mg/dL (ref 0.44–1.00)
GFR, Estimated: >60 mL/min (ref >60)
Glucose, Bld: 109 mg/dL — ABNORMAL HIGH (ref 70–99)
Potassium: 4 mmol/L (ref 3.5–5.1)
Sodium: 138 mmol/L (ref 135–145)

## 2021-11-06 LAB — CULTURE, BLOOD (ROUTINE X 2): Culture: NO GROWTH

## 2021-11-06 LAB — PHOSPHORUS: Phosphorus: 2.2 mg/dL — ABNORMAL LOW (ref 2.5–4.6)

## 2021-11-06 LAB — MAGNESIUM: Magnesium: 1.8 mg/dL (ref 1.7–2.4)

## 2021-11-06 MED ORDER — K PHOS MONO-SOD PHOS DI & MONO 155-852-130 MG PO TABS
250.0000 mg | ORAL_TABLET | Freq: Three times a day (TID) | ORAL | Status: AC
  Administered 2021-11-06 (×3): 250 mg via ORAL
  Filled 2021-11-06 (×3): qty 1

## 2021-11-06 MED ORDER — METOPROLOL SUCCINATE ER 50 MG PO TB24: 50.0000 mg | ORAL_TABLET | Freq: Two times a day (BID) | ORAL | Status: DC

## 2021-11-06 MED ORDER — METOPROLOL SUCCINATE ER 25 MG PO TB24: 75.0000 mg | ORAL_TABLET | Freq: Two times a day (BID) | ORAL | Status: DC

## 2021-11-06 MED ORDER — METOPROLOL SUCCINATE ER 50 MG PO TB24
75.0000 mg | ORAL_TABLET | Freq: Two times a day (BID) | ORAL | Status: DC
Start: 2021-11-06 — End: 2021-11-10
  Administered 2021-11-06 – 2021-11-09 (×6): 75 mg via ORAL
  Filled 2021-11-06 (×7): qty 1

## 2021-11-06 MED ORDER — MAGNESIUM SULFATE 2 GM/50ML IV SOLN
2.0000 g | Freq: Once | INTRAVENOUS | Status: AC
  Administered 2021-11-06: 2 g via INTRAVENOUS
  Filled 2021-11-06: qty 50

## 2021-11-06 MED ORDER — PNEUMOCOCCAL 20-VAL CONJ VACC 0.5 ML IM SUSY
0.5000 mL | PREFILLED_SYRINGE | INTRAMUSCULAR | Status: AC
  Administered 2021-11-07: 0.5 mL via INTRAMUSCULAR
  Filled 2021-11-06: qty 0.5

## 2021-11-06 NOTE — TOC Progression Note (Addendum)
Transition of Care (TOC) - Progression Note  ? ? ?Patient Details  ?Name: Sara Merritt ?MRN: [id number] ?Date of Birth: 09-25-1929 ? ?Transition of Care (TOC) CM/SW Contact  ?Milas Gain, LCSWA ?Phone Number: ?11/06/2021, 2:13 PM ? ?Clinical Narrative:    ? ?CSW received consult for possible SNF placement at time of discharge. CSW spoke with patients daughter [name] regarding PT recommendation of SNF placement at time of discharge. Patients daughter [name] expressed understanding of PT recommendation for patient and declined  SNF placement at time of discharge. Patients daughter reports patient was recently at [facility] and used up all her rehab days. Patients daughter confirmed that she wants to go with original dc plan for patient to return to [facility] with [place] orders. No further questions reported at this time. CSW spoke with Lysle Morales at [facility] who confirmed they can accept patient back when medically ready for dc. CSW will continue to follow and assist with discharge planning needs.  ? ?Expected Discharge Plan: Assisted Living (back to [place]) ?Barriers to Discharge: Continued Medical Work up ? ?Expected Discharge Plan and Services ?Expected Discharge Plan: Assisted Living (back to [place]) ?In-house Referral: Clinical Social Work ?  ?  ?Living arrangements for the past 2 months: [place] ([facility]) ?                ?  ?  ?  ?  ?  ?  ?  ?  ?  ?  ? ? ?Social Determinants of Health (SDOH) Interventions ?  ? ?Readmission Risk Interventions ?   ? View : No data to display.  ?  ?  ?  ? ? ?

## 2021-11-06 NOTE — Progress Notes (Addendum)
? ?Progress Note ? ?Patient Name: Sara Merritt ?Date of Encounter: 11/06/2021 ? ?[facility] Cardiologist: Elouise Munroe, MD  ? ?Subjective  ? ?No chest pain no SOB.  Has not been up too much.  ? ?Inpatient Medications  ?  ?Scheduled Meds: ? allopurinol  200 mg Oral Daily  ? apixaban  5 mg Oral BID  ? atorvastatin  10 mg Oral Daily  ? calcium carbonate  1,250 mg Oral Daily  ? Chlorhexidine Gluconate Cloth  6 each Topical Daily  ? feeding supplement  1 Container Oral TID BM  ? Gerhardt's butt cream   Topical TID  ? insulin aspart  0-9 Units Subcutaneous TID WC  ? levothyroxine  75 mcg Oral Daily  ? magnesium oxide  400 mg Oral BID  ? metoprolol succinate  50 mg Oral BID  ? pantoprazole  40 mg Oral Daily  ? phosphorus  250 mg Oral TID  ? sodium chloride flush  10-40 mL Intracatheter Q12H  ? ?Continuous Infusions: ? ?PRN Meds: ?acetaminophen, ondansetron (ZOFRAN) IV, sodium chloride flush  ? ?Vital Signs  ?  ?Vitals:  ? 11/05/21 2346 11/06/21 0000 11/06/21 0355 11/06/21 0615  ?BP: 101/69  99/64   ?Pulse: 94  (!) 107 97  ?Resp: (!) '23 17 20 18  '$ ?Temp: 98.7 ?F (37.1 ?C)  98.5 ?F (36.9 ?C)   ?TempSrc: Oral  Oral   ?SpO2: 96%  95% 98%  ?Weight:   79.7 kg   ?Height:      ? ? ?Intake/Output Summary (Last 24 hours) at 11/06/2021 0756 ?Last data filed at 11/05/2021 2112 ?Gross per 24 hour  ?Intake 97.47 ml  ?Output --  ?Net 97.47 ml  ? ? ?  11/06/2021  ?  3:55 AM 11/03/2021  ?  5:00 AM 11/02/2021  ?  3:39 AM  ?Last 3 Weights  ?Weight (lbs) 175 lb 11.3 oz 178 lb 12.7 oz 178 lb 2.1 oz  ?Weight (kg) 79.7 kg 81.1 kg 80.8 kg  ?   ? ?Telemetry  ?  ?Atrial fib rate rate 120-130 this AM yesterday with increase meds HR 88-100s - Personally Reviewed ? ?ECG  ?  ?No new - Personally Reviewed ? ?Physical Exam  ? ?GEN: No acute distress.   ?Neck: No JVD ?Cardiac: irreg irreg, no murmurs, rubs, or gallops.  ?Respiratory: Clear to auscultation bilaterally. ?GI: Soft, nontender, non-distended  ?MS: No edema; No deformity. ?Neuro:   Nonfocal  ?Psych: Normal affect  ? ?Labs  ?  ?High Sensitivity Troponin:  No results for input(s): TROPONINIHS in the last 720 hours.   ?Chemistry ?Recent Labs  ?Lab 11/02/21 ?0347 11/03/21 ?0751 11/04/21 ?4259 11/05/21 ?5638 11/06/21 ?0356  ?NA 138   < > 136 140 138  ?K 4.9   < > 3.7 4.2 4.0  ?CL 111   < > 107 113* 109  ?CO2 21*   < > '24 24 25  '$ ?GLUCOSE 104*   < > 189* 102* 109*  ?BUN 16   < > 5* 5* <5*  ?CREATININE 0.74   < > 0.65 0.61 0.58  ?CALCIUM 8.2*   < > 7.9* 7.9* 8.1*  ?MG 1.9  --  1.7  --  1.8  ?PROT 4.6*  --  4.3*  --   --   ?ALBUMIN 2.1*  --  1.8*  --   --   ?AST 28  --  21  --   --   ?ALT 19  --  7  --   --   ?  ALKPHOS 48  --  49  --   --   ?BILITOT 0.4  --  0.4  --   --   ?GFRNONAA >60   < > >60 >60 >60  ?ANIONGAP 6   < > 5 3* 4*  ? < > = values in this interval not displayed.  ?  ?Lipids No results for input(s): CHOL, TRIG, HDL, LABVLDL, LDLCALC, CHOLHDL in the last 168 hours.  ?Hematology ?Recent Labs  ?Lab 11/04/21 ?0348 11/05/21 ?0416 11/06/21 ?0356  ?WBC 5.4 5.5 5.5  ?RBC 3.12* 3.26* 3.16*  ?HGB 11.0* 11.4* 11.2*  ?HCT 33.0* 34.5* 33.5*  ?MCV 105.8* 105.8* 106.0*  ?MCH 35.3* 35.0* 35.4*  ?MCHC 33.3 33.0 33.4  ?RDW 14.3 14.2 14.3  ?PLT 113* 129* 140*  ? ?Thyroid  ?Recent Labs  ?Lab 11/03/21 ?0751  ?TSH 6.793*  ?FREET4 1.35*  ?  ?BNPNo results for input(s): BNP, PROBNP in the last 168 hours.  ?DDimer No results for input(s): DDIMER in the last 168 hours.  ? ?Radiology  ?  ?No results found. ? ?Cardiac Studies  ? ? ?TTE 11/01/21 ?IMPRESSIONS  ? ? ? 1. Left ventricular ejection fraction, by estimation, is 55 to 60%. The  ?left ventricle has normal function. The left ventricle has no regional  ?wall motion abnormalities. Left ventricular diastolic parameters are  ?indeterminate. Elevated left ventricular  ?end-diastolic pressure.  ? 2. Right ventricular systolic function is normal. The right ventricular  ?size is moderately enlarged. There is severely elevated pulmonary artery  ?systolic pressure.  ? 3.  Left atrial size was severely dilated.  ? 4. Right atrial size was severely dilated.  ? 5. The mitral valve is normal in structure. Trivial mitral valve  ?regurgitation. No evidence of mitral stenosis. Moderate mitral annular  ?calcification.  ? 6. Tricuspid valve regurgitation is moderate.  ? 7. The aortic valve is calcified. There is moderate calcification of the  ?aortic valve. There is moderate thickening of the aortic valve. Aortic  ?valve regurgitation is trivial. Moderate aortic valve stenosis.  ? 8. The inferior vena cava is normal in size with greater than 50%  ?respiratory variability, suggesting right atrial pressure of 3 mmHg.  ? ?FINDINGS  ? Left Ventricle: Left ventricular ejection fraction, by estimation, is 55  ?to 60%. The left ventricle has normal function. The left ventricle has no  ?regional wall motion abnormalities. The left ventricular internal cavity  ?size was normal in size. There is  ? no left ventricular hypertrophy. Left ventricular diastolic function  ?could not be evaluated due to atrial fibrillation. Left ventricular  ?diastolic parameters are indeterminate. Elevated left ventricular  ?end-diastolic pressure.  ? ?Right Ventricle: The right ventricular size is moderately enlarged. No  ?increase in right ventricular wall thickness. Right ventricular systolic  ?function is normal. There is severely elevated pulmonary artery systolic  ?pressure. The tricuspid regurgitant  ?velocity is 4.61 m/s, and with an assumed right atrial pressure of 3 mmHg,  ?the estimated right ventricular systolic pressure is 96.2 mmHg.  ? ?Left Atrium: Left atrial size was severely dilated.  ? ?Right Atrium: Right atrial size was severely dilated.  ? ?Pericardium: There is no evidence of pericardial effusion.  ? ?Mitral Valve: The mitral valve is normal in structure. Moderate mitral  ?annular calcification. Trivial mitral valve regurgitation. No evidence of  ?mitral valve stenosis.  ? ?Tricuspid Valve: The  tricuspid valve is normal in structure. Tricuspid  ?valve regurgitation is moderate . No evidence of tricuspid stenosis.  ? ?  Aortic Valve: The aortic valve is calcified. There is moderate  ?calcification of the aortic valve. There is moderate thickening of the  ?aortic valve. Aortic valve regurgitation is trivial. Aortic regurgitation  ?PHT measures 388 msec. Moderate aortic  ?stenosis is present. Aortic valve mean gradient measures 16.5 mmHg. Aortic  ?valve peak gradient measures 26.7 mmHg. Aortic valve area, by VTI measures  ?0.95 cm?.  ? ?Pulmonic Valve: The pulmonic valve was normal in structure. Pulmonic valve  ?regurgitation is not visualized. No evidence of pulmonic stenosis.  ? ?Aorta: The aortic root is normal in size and structure.  ? ?Venous: The inferior vena cava is normal in size with greater than 50%  ?respiratory variability, suggesting right atrial pressure of 3 mmHg.  ? ?IAS/Shunts: No atrial level shunt detected by color flow Doppler.  ?Patient Profile  ?   ?88 y.o. female with a history of persistent/permanent atrial fibrillation on Coumadin, hypertension, pulmonary hypertension, hyperlipidemia, type 2 diabetes mellitus, CKD stage II-III,  hypothyroidism, and breast cancer s/p lumpectomy now with atrial fib.  ?  ? ?Assessment & Plan  ? Persistent/ Permanent Atrial Fibrillation ?Patient reportedly has had atrial fibrillation since the 1950s, in her 71s perhaps. Unclear if atrial fibrillation is permanent at this point. Patient now presents with rapid atrial fibrillation with rates reportedly in the 200s and was hypotensive in the field. Started on IV Amiodarone. Echo shows normal LV function. ?-Maintain K greater than 4, mag greater than 2 ?- TSH elevated ?- BP appears improved, added metoprolol ?- Given advanced age and concomitant use of amiodarone, discontinued digoxin due to about potential toxicities.   ?- CHA2DS-VASc = 5 (HTN, DM, age x2, female).  Has been on Coumadin for anticoagulation,  switched to Eliquis ?- On IV Amiodarone.  IV infiltrated, PICC line .  Heart rates have improved significantly and BP is improved.  IV amiodarone d/c'd --consolidated BB to  toprol XL on discharge - BP a l

## 2021-11-06 NOTE — Progress Notes (Signed)
Physical Therapy Treatment ?Patient Details ?Name: Sara Merritt ?MRN: [id number] ?DOB: 06-Feb-1930 ?Today's Date: 11/06/2021 ? ? ?History of Present Illness 88 y.o. female  adm 3/22 with fatigue, weakness and dizziness. Pt tachycardic with hypotension in Afib with RVR. PMH:  breast CA, HTN, CKD, Afib, hypothyroidism, DM, and tachycardia. ? ?  ?PT Comments  ? ? Patient received in bed, daughter at bedside. She is agreeable to PT session. HR looking some better. In 110s to 120s initially. Patient required min assist to raise trunk, mod assist to scoot hips out to edge of bed. Upon sitting patient reports significant dizziness. Attempted to stand with mod A and bed elevated. Unable to lift bottom from bed. Patient was able to sit up for 10 min with supervision and then returned to supine. Continued to report dizziness until lying back down. She will continue to benefit from skilled PT while here to improve activity tolerance and strength. At this time I feel like she would need too much assistance to return to ALF and would benefit from SNF initially.  ?   ?Recommendations for follow up therapy are one component of a multi-disciplinary discharge planning process, led by the attending physician.  Recommendations may be updated based on patient status, additional functional criteria and insurance authorization. ? ?Follow Up Recommendations ? Skilled nursing-short term rehab (<3 hours/day) ?  ?  ?Assistance Recommended at Discharge Frequent or constant Supervision/Assistance  ?Patient can return home with the following Two people to help with walking and/or transfers;A lot of help with bathing/dressing/bathroom;Help with stairs or ramp for entrance;Assist for transportation;Assistance with cooking/housework ?  ?Equipment Recommendations ? None recommended by PT  ?  ?Recommendations for Other Services   ? ? ?  ?Precautions / Restrictions Precautions ?Precautions: Fall ?Precaution Comments: watch HR and BP HR between  110s and 150s during session ?Restrictions ?Weight Bearing Restrictions: No  ?  ? ?Mobility ? Bed Mobility ?Overal bed mobility: Needs Assistance ?Bed Mobility: Supine to Sit, Sit to Supine ?  ?  ?Supine to sit: Min assist ?Sit to supine: Mod assist, +2 for physical assistance ?  ?General bed mobility comments: HOb 30 degrees with use of rail and HHA to elevate trunk to sitting. mod assist with pad to scoot hips to EOB with cue for sequence ?  ? ?Transfers ?  ?Equipment used: Rolling walker (2 wheels) ?  ?  ?  ?  ?  ?  ?  ?General transfer comment: reports she is very "woozy" states she is unable to stand. Attempted x 1 ?  ? ?Ambulation/Gait ?  ?  ?  ?  ?  ?  ?  ?General Gait Details: unable ? ? ?Stairs ?  ?  ?  ?  ?  ? ? ?Wheelchair Mobility ?  ? ?Modified Rankin (Stroke Patients Only) ?  ? ? ?  ?Balance Overall balance assessment: Needs assistance ?Sitting-balance support: Feet supported ?Sitting balance-Leahy Scale: Fair ?Sitting balance - Comments: EOB without UE support ?  ?  ?  ?  ?  ?  ?  ?  ?  ?  ?  ?  ?  ?  ?  ?  ? ?  ?Cognition Arousal/Alertness: Awake/alert ?Behavior During Therapy: [facility] for tasks assessed/performed ?Overall Cognitive Status: Within Functional Limits for tasks assessed ?  ?  ?  ?  ?  ?  ?  ?  ?  ?  ?  ?  ?  ?  ?  ?  ?General Comments: Very  pleasant and willing to mobilize, fear of falling and prefers +2 assist for all transfers, progressive mobility ?  ?  ? ?  ?Exercises   ? ?  ?General Comments   ?  ?  ? ?Pertinent Vitals/Pain Pain Assessment ?Pain Assessment: No/denies pain  ? ? ?Home Living   ?  ?  ?  ?  ?  ?  ?  ?  ?  ?   ?  ?Prior Function    ?  ?  ?   ? ?PT Goals (current goals can now be found in the care plan section) Acute Rehab PT Goals ?Patient Stated Goal: to go to A living ?PT Goal Formulation: With patient/family ?Time For Goal Achievement: 11/17/21 ?Potential to Achieve Goals: Fair ?Progress towards PT goals: Not progressing toward goals - comment (limited by dizziness this  session) ? ?  ?Frequency ? ? ? Min 3X/week ? ? ? ?  ?PT Plan Current plan remains appropriate  ? ? ?Co-evaluation   ?  ?  ?  ?  ? ?  ?AM-PAC PT "6 Clicks" Mobility   ?Outcome Measure ? Help needed turning from your back to your side while in a flat bed without using bedrails?: A Little ?Help needed moving from lying on your back to sitting on the side of a flat bed without using bedrails?: A Little ?Help needed moving to and from a bed to a chair (including a wheelchair)?: Total ?Help needed standing up from a chair using your arms (e.g., wheelchair or bedside chair)?: Total ?  ?Help needed climbing 3-5 steps with a railing? : Total ?6 Click Score: 9 ? ?  ?End of Session Equipment Utilized During Treatment: Gait belt ?Activity Tolerance: Patient limited by fatigue;Other (comment) (dizziness with sitting edge of bed) ?Patient left: with bed alarm set;in bed;with call bell/phone within reach;with family/visitor present ?Nurse Communication: Mobility status ?PT Visit Diagnosis: Muscle weakness (generalized) (M62.81);Other abnormalities of gait and mobility (R26.89) ?  ? ? ?Time: 1240-1309 ?PT Time Calculation (min) (ACUTE ONLY): 29 min ? ?Charges:  $Therapeutic Activity: 23-37 mins          ?          ? ?[employer], PT, [employer] ?11/06/21,1:18 PM ? ? ?

## 2021-11-06 NOTE — Plan of Care (Signed)
?  Problem: Clinical Measurements: ?Goal: Will remain free from infection ?Outcome: Progressing ?  ?

## 2021-11-06 NOTE — Progress Notes (Addendum)
?PROGRESS NOTE ? ?Sara Merritt ZOX:[id number] DOB: [birth year] DOA: 10/31/2021 ?PCP: Marco Collie, MD ? ? LOS: 5 days  ? ?Brief narrative: ? ?Sara Merritt is a 88 y.o. female with medical history significant of breast cancer status post lumpectomy, hypertension, CKD, atrial fibrillation, hypothyroidism, diabetes mellitus with tachycardia presented to the hospital feeling unwell, fatigue and weakness with dizziness on standing.  EMS was called in and patient was noted to be tachycardic in the 200s with hypotension.    In the ED, patient had transient hypotension after receiving Cardizem drip so was started on amiodarone drip.  Initial labs showed a potassium of 3.2, INR at the 1.6 and lactate elevated at 2.2 and 2.4.  Chest x-ray showed cardiomegaly.  Patient also received 500 cc of fluid in the ED and 30 mEq of potassium and was admitted hospital for further evaluation and treatment.   ? ?During hospitalization, patient remained hypotensive and required multiple fluid boluses.  Blood culture was positive in 1 bottle for staph so vancomycin was initiated.  Patient was also given digoxin 0.25 mg x 3 .  Cardiology followed the patient during hospitalization and was continued on amiodarone drip with subsequent change to metoprolol.  Patient continues to have tachycardia especially on minimal exertion and Cardiology is adjusting beta-blockers. ? ?Assessment/Plan: ? ?Principal Problem: ?  Atrial fibrillation with rapid ventricular response ([place]) ?Active Problems: ?  HTN (hypertension) ?  Hypothyroidism ?  Diabetes ([place]) ?  GERD (gastroesophageal reflux disease) ?  Hypokalemia ?  Leg pain, diffuse, left ?  Bacteremia ?  Acute deep vein thrombosis (DVT) of proximal vein of lower extremity ([facility]) ? ?Atrial fibrillation with RVR ?On presentation initial RVR up to 200s with hypotension.  Still remains tachycardic and currently cardiology adjusting metoprolol doses.  Was on 25 mg twice daily to start with and  has been titrated up to 75 mg twice daily starting today.   2D echocardiogram showed LV ejection fraction of 55 to 60% with no regional wall motion abnormality with severely elevated pulmonary artery systolic pressure.  CHA2DS2-VASc score of 5.  On Eliquis at this time.  Was switched from Coumadin.   ? ?Lower extremity pain.  Secondary to DVT.   ?Ultrasound of the lower extremity showed evidence of acute DVT involving the right popliteal vein, indeterminate left femoral and proximal profunda DVT.  Patient was supposed to be on Coumadin as outpatient but was subtherapeutic.  Coumadin has been changed to Eliquis at this time. ? ?Gram-positive bacterial culture x 1 bottle,  Staph hemolyticus.  Likely contaminant. No true bacteremia. Off antibiotic at this time.  Afebrile. No leukocytosis ? ?Lactic acidosis. Improved with IV fluids.  ? ?Hypokalemia ?Improved after replacement.  Latest potassium of 4.0.   ?  ?Hypothyroidism ?Continue Synthroid. ? ?Hypertension ?Currently improved blood pressure, had hypotension on presentation and required IV fluid bolus and amiodarone.  Hypotension has improved at this time.  Currently titrating on the metoprolol  ? ?Diabetes mellitus type II ?Continue sliding scale insulin for now.  Overall controlled.  Latest POC glucose of 100 ? ?GERD ?Continue PPI. ? ?Hyperlipidemia ?Simvastatin has been changed to atorvastatin ? ?Hypomagnesemia. ?Improved after replacement.  Latest magnesium of 1.8.  Continue oral magnesium oxide.  Check levels in a.m. ? ?Poor IV access.  Required PICC line. ? ?Hypophosphatemia.  We will replenish with Neutra-Phos.  Check phosphate level in AM. ? ?Deconditioning, debility PT, OT has seen the patient at this time.  From assisted living facility.  Will  need home health services in assisted living facility on discharge. ? ?Disposition  assisted living facility with home health services on discharge when okay with cardiology.  Likely in 1 to 2 days ? ?DVT prophylaxis:  Place TED hose Start: 11/03/21 1347 ?apixaban (ELIQUIS) tablet 5 mg  ? ? ?Code Status: Full code ? ?Family Communication:  ?Spoke with the patient's  daughter at bedside on 11/05/2021 ? ?Status is: Inpatient ? ?The patient is  inpatient because: Atrial fibrillation with RVR, new DVT, cardiology follow-up and titration of medication ?  ?Consultants: ?Cardiology ? ?Procedures: ?None ? ?Anti-infectives: ? ?None ? ?Anti-infectives (From admission, onward)  ? ? Start     Dose/Rate Route Frequency Ordered Stop  ? 11/01/21 2030  ceFAZolin (ANCEF) IVPB 2g/100 mL premix  Status:  Discontinued       ? 2 g ?200 mL/hr over 30 Minutes Intravenous Every 8 hours 11/01/21 1947 11/03/21 1044  ? 11/01/21 2000  ceFAZolin (ANCEF) IVPB 2g/100 mL premix  Status:  Discontinued       ? 2 g ?200 mL/hr over 30 Minutes Intravenous 2 times daily 11/01/21 1848 11/01/21 1947  ? ?  ? ? ?Subjective: ?Today, patient was seen and examined at bedside.  Denies any chest pain, nausea, vomiting, shortness of breath, dizziness lightheadedness or fever.  Patient's daughter at bedside.  Still appears to be tachycardic especially on minimal exertion  ? ?Objective: ?Vitals:  ? 11/06/21 0818 11/06/21 1139  ?BP: 104/76 112/71  ?Pulse: (!) 111 94  ?Resp: 20 19  ?Temp: 98.2 ?F (36.8 ?C) 98.4 ?F (36.9 ?C)  ?SpO2: 95% 95%  ? ? ?Intake/Output Summary (Last 24 hours) at 11/06/2021 1302 ?Last data filed at 11/06/2021 1138 ?Gross per 24 hour  ?Intake 557.47 ml  ?Output --  ?Net 557.47 ml  ? ? ?Filed Weights  ? 11/02/21 0339 11/03/21 0500 11/06/21 0355  ?Weight: 80.8 kg 81.1 kg 79.7 kg  ? ?Body mass index is 30.16 kg/m?.  ? ?Physical Exam: ? ?General: Obese built, not in obvious distress ?HENT:   No scleral pallor or icterus noted. Oral mucosa is moist.  ?Chest:  Clear breath sounds.  Diminished breath sounds bilaterally. No crackles or wheezes.  ?CVS: S1 &S2 heard.  Systolic murmur, irregularly irregular rhythm. ?Abdomen: Soft, nontender, nondistended.  Bowel sounds are  heard.   ?Extremities: No cyanosis, clubbing or edema.  Peripheral pulses are palpable.  Tenderness on palpation of the bilateral lower extremities. ?Psych: Alert, awake and oriented, normal mood ?CNS:  No cranial nerve deficits.  Generalized weakness but moves all extremities. ?Skin: Warm and dry.  No rashes noted. ? ? ?Data Review: I have personally reviewed the following laboratory data and studies, ? ?CBC: ?Recent Labs  ?Lab 10/31/21 ?1425 11/01/21 ?0428 11/02/21 ?8416 11/04/21 ?6063 11/05/21 ?0160 11/06/21 ?0356  ?WBC 8.4 6.8 7.4 5.4 5.5 5.5  ?NEUTROABS 6.4  --   --   --   --   --   ?HGB 13.5 11.9* 11.3* 11.0* 11.4* 11.2*  ?HCT 39.9 36.1 35.1* 33.0* 34.5* 33.5*  ?MCV 103.9* 106.5* 107.7* 105.8* 105.8* 106.0*  ?PLT 197 142* 123* 113* 129* 140*  ? ? ?Basic Metabolic Panel: ?Recent Labs  ?Lab 10/31/21 ?2142 11/01/21 ?1093 11/02/21 ?0244 11/03/21 ?2355 11/04/21 ?7322 11/05/21 ?0254 11/06/21 ?2706  ?NA  --    < > 138 136 136 140 138  ?K  --    < > 4.9 4.3 3.7 4.2 4.0  ?CL  --    < > 111  110 107 113* 109  ?CO2  --    < > 21* 21* '24 24 25  '$ ?GLUCOSE  --    < > 104* 123* 189* 102* 109*  ?BUN  --    < > 16 7* 5* 5* <5*  ?CREATININE  --    < > 0.74 0.73 0.65 0.61 0.58  ?CALCIUM  --    < > 8.2* 8.1* 7.9* 7.9* 8.1*  ?MG 1.5*  --  1.9  --  1.7  --  1.8  ?PHOS  --   --   --   --   --   --  2.2*  ? < > = values in this interval not displayed.  ? ? ?Liver Function Tests: ?Recent Labs  ?Lab 11/02/21 ?3382 11/04/21 ?5053  ?AST 28 21  ?ALT 19 7  ?ALKPHOS 48 49  ?BILITOT 0.4 0.4  ?PROT 4.6* 4.3*  ?ALBUMIN 2.1* 1.8*  ? ? ?No results for input(s): LIPASE, AMYLASE in the last 168 hours. ?No results for input(s): AMMONIA in the last 168 hours. ?Cardiac Enzymes: ?No results for input(s): CKTOTAL, CKMB, CKMBINDEX, TROPONINI in the last 168 hours. ?BNP (last 3 results) ?No results for input(s): BNP in the last 8760 hours. ? ?ProBNP (last 3 results) ?No results for input(s): PROBNP in the last 8760 hours. ? ?CBG: ?Recent Labs  ?Lab  11/05/21 ?1218 11/05/21 ?1503 11/05/21 ?2124 11/06/21 ?0731 11/06/21 ?1137  ?GLUCAP 115* 84 101* 103* 100*  ? ? ?Recent Results (from the past 240 hour(s))  ?Culture, blood (Routine X 2) w Reflex to ID Panel

## 2021-11-07 DIAGNOSIS — E119 Type 2 diabetes mellitus without complications: Secondary | ICD-10-CM | POA: Diagnosis not present

## 2021-11-07 DIAGNOSIS — E876 Hypokalemia: Secondary | ICD-10-CM | POA: Diagnosis not present

## 2021-11-07 DIAGNOSIS — I824Y3 Acute embolism and thrombosis of unspecified deep veins of proximal lower extremity, bilateral: Secondary | ICD-10-CM | POA: Diagnosis not present

## 2021-11-07 DIAGNOSIS — I4891 Unspecified atrial fibrillation: Secondary | ICD-10-CM | POA: Diagnosis not present

## 2021-11-07 LAB — GLUCOSE, CAPILLARY
Glucose-Capillary: 105 mg/dL — ABNORMAL HIGH (ref 70–99)
Glucose-Capillary: 132 mg/dL — ABNORMAL HIGH (ref 70–99)
Glucose-Capillary: 69 mg/dL — ABNORMAL LOW (ref 70–99)
Glucose-Capillary: 96 mg/dL (ref 70–99)
Glucose-Capillary: 105 mg/dL — ABNORMAL HIGH (ref 70–99)

## 2021-11-07 LAB — BASIC METABOLIC PANEL
Anion gap: 5 (ref 5–15)
BUN: 7 mg/dL — ABNORMAL LOW (ref 8–23)
CO2: 26 mmol/L (ref 22–32)
Calcium: 8.2 mg/dL — ABNORMAL LOW (ref 8.9–10.3)
Chloride: 108 mmol/L (ref 98–111)
Creatinine, Ser: 0.65 mg/dL (ref 0.44–1.00)
GFR, Estimated: >60 mL/min (ref >60)
Glucose, Bld: 106 mg/dL — ABNORMAL HIGH (ref 70–99)
Potassium: 4.1 mmol/L (ref 3.5–5.1)
Sodium: 139 mmol/L (ref 135–145)

## 2021-11-07 LAB — CBC
HCT: 34.3 % — ABNORMAL LOW (ref 36.0–46.0)
Hemoglobin: 11.2 g/dL — ABNORMAL LOW (ref 12.0–15.0)
MCH: 34.9 pg — ABNORMAL HIGH (ref 26.0–34.0)
MCHC: 32.7 g/dL (ref 30.0–36.0)
MCV: 106.9 fL — ABNORMAL HIGH (ref 80.0–100.0)
Platelets: 152 10*3/uL (ref 150–400)
RBC: 3.21 MIL/uL — ABNORMAL LOW (ref 3.87–5.11)
RDW: 14.4 % (ref 11.5–15.5)
WBC: 6.2 10*3/uL (ref 4.0–10.5)
nRBC: 0 % (ref 0.0–0.2)

## 2021-11-07 LAB — PHOSPHORUS: Phosphorus: 3.7 mg/dL (ref 2.5–4.6)

## 2021-11-07 LAB — MAGNESIUM: Magnesium: 2.1 mg/dL (ref 1.7–2.4)

## 2021-11-07 MED ORDER — AMIODARONE HCL 200 MG PO TABS
200.0000 mg | ORAL_TABLET | Freq: Two times a day (BID) | ORAL | Status: DC
  Administered 2021-11-07 – 2021-11-09 (×5): 200 mg via ORAL
  Filled 2021-11-07 (×5): qty 1

## 2021-11-07 NOTE — Care Management Important Message (Signed)
Important Message ? ?Patient Details  ?Name: Sara Merritt ?MRN: [id number] ?Date of Birth: October 16, 1929 ? ? ?Medicare Important Message Given:  Yes ? ? ? ? ?Shelda Altes ?11/07/2021, 11:46 AM ?

## 2021-11-07 NOTE — Progress Notes (Signed)
? ?Progress Note ? ?Patient Name: Sara Merritt ?Date of Encounter: 11/07/2021 ? ?[facility] Cardiologist: Elouise Munroe, MD  ? ?Subjective  ? ?No chest pain no SOB or palpitations ? ?Inpatient Medications  ?  ?Scheduled Meds: ? allopurinol  200 mg Oral Daily  ? amiodarone  200 mg Oral BID  ? apixaban  5 mg Oral BID  ? atorvastatin  10 mg Oral Daily  ? calcium carbonate  1,250 mg Oral Daily  ? Chlorhexidine Gluconate Cloth  6 each Topical Daily  ? feeding supplement  1 Container Oral TID BM  ? Gerhardt's butt cream   Topical TID  ? insulin aspart  0-9 Units Subcutaneous TID WC  ? levothyroxine  75 mcg Oral Daily  ? magnesium oxide  400 mg Oral BID  ? metoprolol succinate  75 mg Oral BID  ? pantoprazole  40 mg Oral Daily  ? sodium chloride flush  10-40 mL Intracatheter Q12H  ? ?Continuous Infusions: ? ?PRN Meds: ?acetaminophen, ondansetron (ZOFRAN) IV, sodium chloride flush  ? ?Vital Signs  ?  ?Vitals:  ? 11/06/21 2138 11/06/21 2351 11/07/21 0315 11/07/21 0900  ?BP: (!) 104/45 107/65 118/77 (!) 118/57  ?Pulse:  90 100 85  ?Resp:  20 19 (!) 23  ?Temp:  98.4 ?F (36.9 ?C) 97.9 ?F (36.6 ?C)   ?TempSrc:  Oral Oral   ?SpO2:  94% 98% 93%  ?Weight:   78.8 kg   ?Height:      ? ? ?Intake/Output Summary (Last 24 hours) at 11/07/2021 1220 ?Last data filed at 11/06/2021 2039 ?Gross per 24 hour  ?Intake 166.69 ml  ?Output --  ?Net 166.69 ml  ? ? ? ?  11/07/2021  ?  3:15 AM 11/06/2021  ?  3:55 AM 11/03/2021  ?  5:00 AM  ?Last 3 Weights  ?Weight (lbs) 173 lb 11.6 oz 175 lb 11.3 oz 178 lb 12.7 oz  ?Weight (kg) 78.8 kg 79.7 kg 81.1 kg  ?   ? ?Telemetry  ?  ?Atrial fib rates 80s to 90s when resting, up to 130s when she gets up- Personally Reviewed ? ?ECG  ?  ?No new - Personally Reviewed ? ?Physical Exam  ? ?GEN: No acute distress.   ?Neck: No JVD ?Cardiac: irreg irreg, no murmurs, rubs, or gallops.  ?Respiratory: Clear to auscultation bilaterally. ?GI: Soft, nontender, non-distended  ?MS: No edema; No deformity. ?Neuro:   Nonfocal  ?Psych: Normal affect  ? ?Labs  ?  ?High Sensitivity Troponin:  No results for input(s): TROPONINIHS in the last 720 hours.   ?Chemistry ?Recent Labs  ?Lab 11/02/21 ?5462 11/03/21 ?7035 11/04/21 ?0093 11/05/21 ?8182 11/06/21 ?9937 11/07/21 ?0325  ?NA 138   < > 136 140 138 139  ?K 4.9   < > 3.7 4.2 4.0 4.1  ?CL 111   < > 107 113* 109 108  ?CO2 21*   < > '24 24 25 26  '$ ?GLUCOSE 104*   < > 189* 102* 109* 106*  ?BUN 16   < > 5* 5* <5* 7*  ?CREATININE 0.74   < > 0.65 0.61 0.58 0.65  ?CALCIUM 8.2*   < > 7.9* 7.9* 8.1* 8.2*  ?MG 1.9  --  1.7  --  1.8 2.1  ?PROT 4.6*  --  4.3*  --   --   --   ?ALBUMIN 2.1*  --  1.8*  --   --   --   ?AST 28  --  21  --   --   --   ?  ALT 19  --  7  --   --   --   ?ALKPHOS 48  --  49  --   --   --   ?BILITOT 0.4  --  0.4  --   --   --   ?GFRNONAA >60   < > >60 >60 >60 >60  ?ANIONGAP 6   < > 5 3* 4* 5  ? < > = values in this interval not displayed.  ? ?  ?Lipids No results for input(s): CHOL, TRIG, HDL, LABVLDL, LDLCALC, CHOLHDL in the last 168 hours.  ?Hematology ?Recent Labs  ?Lab 11/05/21 ?0416 11/06/21 ?0356 11/07/21 ?0325  ?WBC 5.5 5.5 6.2  ?RBC 3.26* 3.16* 3.21*  ?HGB 11.4* 11.2* 11.2*  ?HCT 34.5* 33.5* 34.3*  ?MCV 105.8* 106.0* 106.9*  ?MCH 35.0* 35.4* 34.9*  ?MCHC 33.0 33.4 32.7  ?RDW 14.2 14.3 14.4  ?PLT 129* 140* 152  ? ? ?Thyroid  ?Recent Labs  ?Lab 11/03/21 ?0751  ?TSH 6.793*  ?FREET4 1.35*  ? ?  ?BNPNo results for input(s): BNP, PROBNP in the last 168 hours.  ?DDimer No results for input(s): DDIMER in the last 168 hours.  ? ?Radiology  ?  ?No results found. ? ?Cardiac Studies  ? ? ?TTE 11/01/21 ?IMPRESSIONS  ? ? ? 1. Left ventricular ejection fraction, by estimation, is 55 to 60%. The  ?left ventricle has normal function. The left ventricle has no regional  ?wall motion abnormalities. Left ventricular diastolic parameters are  ?indeterminate. Elevated left ventricular  ?end-diastolic pressure.  ? 2. Right ventricular systolic function is normal. The right ventricular  ?size  is moderately enlarged. There is severely elevated pulmonary artery  ?systolic pressure.  ? 3. Left atrial size was severely dilated.  ? 4. Right atrial size was severely dilated.  ? 5. The mitral valve is normal in structure. Trivial mitral valve  ?regurgitation. No evidence of mitral stenosis. Moderate mitral annular  ?calcification.  ? 6. Tricuspid valve regurgitation is moderate.  ? 7. The aortic valve is calcified. There is moderate calcification of the  ?aortic valve. There is moderate thickening of the aortic valve. Aortic  ?valve regurgitation is trivial. Moderate aortic valve stenosis.  ? 8. The inferior vena cava is normal in size with greater than 50%  ?respiratory variability, suggesting right atrial pressure of 3 mmHg.  ? ?FINDINGS  ? Left Ventricle: Left ventricular ejection fraction, by estimation, is 55  ?to 60%. The left ventricle has normal function. The left ventricle has no  ?regional wall motion abnormalities. The left ventricular internal cavity  ?size was normal in size. There is  ? no left ventricular hypertrophy. Left ventricular diastolic function  ?could not be evaluated due to atrial fibrillation. Left ventricular  ?diastolic parameters are indeterminate. Elevated left ventricular  ?end-diastolic pressure.  ? ?Right Ventricle: The right ventricular size is moderately enlarged. No  ?increase in right ventricular wall thickness. Right ventricular systolic  ?function is normal. There is severely elevated pulmonary artery systolic  ?pressure. The tricuspid regurgitant  ?velocity is 4.61 m/s, and with an assumed right atrial pressure of 3 mmHg,  ?the estimated right ventricular systolic pressure is 27.7 mmHg.  ? ?Left Atrium: Left atrial size was severely dilated.  ? ?Right Atrium: Right atrial size was severely dilated.  ? ?Pericardium: There is no evidence of pericardial effusion.  ? ?Mitral Valve: The mitral valve is normal in structure. Moderate mitral  ?annular calcification. Trivial  mitral valve regurgitation. No evidence of  ?mitral  valve stenosis.  ? ?Tricuspid Valve: The tricuspid valve is normal in structure. Tricuspid  ?valve regurgitation is moderate . No evidence of tricuspid stenosis.  ? ?Aortic Valve: The aortic valve is calcified. There is moderate  ?calcification of the aortic valve. There is moderate thickening of the  ?aortic valve. Aortic valve regurgitation is trivial. Aortic regurgitation  ?PHT measures 388 msec. Moderate aortic  ?stenosis is present. Aortic valve mean gradient measures 16.5 mmHg. Aortic  ?valve peak gradient measures 26.7 mmHg. Aortic valve area, by VTI measures  ?0.95 cm?.  ? ?Pulmonic Valve: The pulmonic valve was normal in structure. Pulmonic valve  ?regurgitation is not visualized. No evidence of pulmonic stenosis.  ? ?Aorta: The aortic root is normal in size and structure.  ? ?Venous: The inferior vena cava is normal in size with greater than 50%  ?respiratory variability, suggesting right atrial pressure of 3 mmHg.  ? ?IAS/Shunts: No atrial level shunt detected by color flow Doppler.  ?Patient Profile  ?   ?88 y.o. female with a history of persistent/permanent atrial fibrillation on Coumadin, hypertension, pulmonary hypertension, hyperlipidemia, type 2 diabetes mellitus, CKD stage II-III,  hypothyroidism, and breast cancer s/p lumpectomy now with atrial fib.  ?  ? ?Assessment & Plan  ? Persistent/ Permanent Atrial Fibrillation ?Patient reportedly has had atrial fibrillation since the 1950s, in her 43s perhaps. Unclear if atrial fibrillation is permanent at this point. Patient now presents with rapid atrial fibrillation with rates reportedly in the 200s and was hypotensive in the field. Started on IV Amiodarone. Echo shows normal LV function. ?-Maintain K greater than 4, mag greater than 2 ?-TSH elevated ?- BP appears improved, added metoprolol ?- Given advanced age and concomitant use of amiodarone, discontinued digoxin due to about potential toxicities.    ?- CHA2DS-VASc = 5 (HTN, DM, age x2, female).  Has been on Coumadin for anticoagulation, switched to Eliquis ?- On IV Amiodarone.  IV infiltrated, PICC line .  Heart rates have improved significantly and

## 2021-11-07 NOTE — Progress Notes (Signed)
?PROGRESS NOTE ? ?Sara Merritt:[id number] DOB: Jan 04, 1930 DOA: 10/31/2021 ?PCP: Marco Collie, MD ? ? LOS: 6 days  ? ?Brief narrative: ? ?Sara Merritt is a 88 y.o. female with medical history significant of breast cancer status post lumpectomy, hypertension, CKD, atrial fibrillation, hypothyroidism, diabetes mellitus with tachycardia presented to the hospital feeling unwell, fatigue and weakness with dizziness on standing.  EMS was called in and patient was noted to be tachycardic in the 200s with hypotension.    In the ED, patient had transient hypotension after receiving Cardizem drip so was started on amiodarone drip.  Initial labs showed a potassium of 3.2, INR at the 1.6 and lactate elevated at 2.2 and 2.4.  Chest x-ray showed cardiomegaly.  Patient also received 500 cc of fluid in the ED and 30 mEq of potassium and was admitted hospital for further evaluation and treatment.   ? ?During hospitalization, patient remained hypotensive and required multiple fluid boluses.  Blood culture was positive in 1 bottle for staph so vancomycin was initiated.  Patient was also given digoxin 0.25 mg x 3 .  Cardiology followed the patient during hospitalization and was continued on amiodarone drip with subsequent change to metoprolol.  Patient continues to have tachycardia especially on minimal exertion and Cardiology is adjusting medications. ? ?Assessment/Plan: ? ?Principal Problem: ?  Atrial fibrillation with rapid ventricular response ([place]) ?Active Problems: ?  HTN (hypertension) ?  Hypothyroidism ?  Diabetes ([place]) ?  GERD (gastroesophageal reflux disease) ?  Hypokalemia ?  Leg pain, diffuse, left ?  Bacteremia ?  Acute deep vein thrombosis (DVT) of proximal vein of lower extremity (H[facility]) ? ?Atrial fibrillation with RVR ?On presentation initial RVR up to 200s with hypotension.  Still remains tachycardic and currently cardiology adjusting medications.  Metoprolol increased to 75 mg twice daily.   2D  echocardiogram showed LV ejection fraction of 55 to 60% with no regional wall motion abnormality with severely elevated pulmonary artery systolic pressure.  CHA2DS2-VASc score of 5.  On Eliquis at this time.  Was switched from Coumadin.  Since heart rate remains uncontrolled on exertion, amiodarone is being added by cardiology today. ? ?Lower extremity pain.  Secondary to DVT.   ?Ultrasound of the lower extremity showed evidence of acute DVT involving the right popliteal vein, indeterminate left femoral and proximal profunda DVT.  Patient was supposed to be on Coumadin as outpatient but was subtherapeutic.  Coumadin has been changed to Eliquis at this time. ? ?Gram-positive bacterial culture x 1 bottle,  Staph hemolyticus.  Likely contaminant. No true bacteremia. Off antibiotic at this time.  Afebrile. No leukocytosis ? ?Lactic acidosis. Improved with IV fluids.  ? ?Hypokalemia ?Improved after replacement.  ?  ?Hypothyroidism ?Continue Synthroid. ? ?Hypertension ?Currently improved blood pressure, had hypotension on presentation and required IV fluid bolus and amiodarone.  Hypotension has improved at this time.  Currently titrating on the metoprolol  ? ?Diabetes mellitus type II ?Continue sliding scale insulin for now.  Overall controlled.   ? ?GERD ?Continue PPI. ? ?Hyperlipidemia ?Simvastatin has been changed to atorvastatin ? ?Hypomagnesemia. ?Improved after replacement.  Latest magnesium of 1.8.  Continue oral magnesium oxide.  Check levels in a.m. ? ?Poor IV access.  Required PICC line. ? ?Hypophosphatemia.  We will replenish with Neutra-Phos.  Check phosphate level in AM. ? ?Deconditioning, debility PT, OT has seen the patient at this time.  From assisted living facility.  Will need home health services in assisted living facility on discharge. ? ?Disposition  assisted living facility with home health services on discharge when okay with cardiology.  Likely in 1 to 2 days ? ?DVT prophylaxis: Place TED hose  Start: 11/03/21 1347 ?apixaban (ELIQUIS) tablet 5 mg  ? ? ?Code Status: Full code ? ?Family Communication:  ?Spoke with the patient's  daughter at bedside on 11/07/2021 ? ?Status is: Inpatient ? ?The patient is  inpatient because: Atrial fibrillation with RVR, new DVT, cardiology follow-up and titration of medication ?  ?Consultants: ?Cardiology ? ?Procedures: ?None ? ?Anti-infectives: ? ?None ? ?Anti-infectives (From admission, onward)  ? ? Start     Dose/Rate Route Frequency Ordered Stop  ? 11/01/21 2030  ceFAZolin (ANCEF) IVPB 2g/100 mL premix  Status:  Discontinued       ? 2 g ?200 mL/hr over 30 Minutes Intravenous Every 8 hours 11/01/21 1947 11/03/21 1044  ? 11/01/21 2000  ceFAZolin (ANCEF) IVPB 2g/100 mL premix  Status:  Discontinued       ? 2 g ?200 mL/hr over 30 Minutes Intravenous 2 times daily 11/01/21 1848 11/01/21 1947  ? ?  ? ? ?Subjective: ?She is working with physical therapy and is sitting up on the side of bed.  Denies any chest pain, shortness of breath or lightheadedness.  It is noted on the monitor that she is tachycardic with heart rate in the 120s to 150s. ? ?Objective: ?Vitals:  ? 11/07/21 0900 11/07/21 1600  ?BP: (!) 118/57 94/65  ?Pulse: 85 90  ?Resp: (!) 23 16  ?Temp:  97.9 ?F (36.6 ?C)  ?SpO2: 93% 96%  ? ? ?Intake/Output Summary (Last 24 hours) at 11/07/2021 1920 ?Last data filed at 11/06/2021 2039 ?Gross per 24 hour  ?Intake 120 ml  ?Output --  ?Net 120 ml  ? ?Filed Weights  ? 11/03/21 0500 11/06/21 0355 11/07/21 0315  ?Weight: 81.1 kg 79.7 kg 78.8 kg  ? ?Body mass index is 29.82 kg/m?.  ? ?Physical Exam: ? ?General exam: Alert, awake, oriented x 3 ?Respiratory system: Clear to auscultation. Respiratory effort normal. ?Cardiovascular system:irregular. No murmurs, rubs, gallops. ?Gastrointestinal system: Abdomen is nondistended, soft and nontender. No organomegaly or masses felt. Normal bowel sounds heard. ?Central nervous system: Alert and oriented. No focal neurological  deficits. ?Extremities: No C/C/E, +pedal pulses ?Skin: No rashes, lesions or ulcers ?Psychiatry: Judgement and insight appear normal. Mood & affect appropriate.  ? ? ? ?Data Review: I have personally reviewed the following laboratory data and studies, ? ?CBC: ?Recent Labs  ?Lab 11/02/21 ?8185 11/04/21 ?6314 11/05/21 ?9702 11/06/21 ?6378 11/07/21 ?0325  ?WBC 7.4 5.4 5.5 5.5 6.2  ?HGB 11.3* 11.0* 11.4* 11.2* 11.2*  ?HCT 35.1* 33.0* 34.5* 33.5* 34.3*  ?MCV 107.7* 105.8* 105.8* 106.0* 106.9*  ?PLT 123* 113* 129* 140* 152  ? ?Basic Metabolic Panel: ?Recent Labs  ?Lab 10/31/21 ?2142 11/01/21 ?5885 11/02/21 ?0244 11/03/21 ?0277 11/04/21 ?4128 11/05/21 ?7867 11/06/21 ?6720 11/07/21 ?9470  ?NA  --    < > 138 136 136 140 138 139  ?K  --    < > 4.9 4.3 3.7 4.2 4.0 4.1  ?CL  --    < > 111 110 107 113* 109 108  ?CO2  --    < > 21* 21* '24 24 25 26  '$ ?GLUCOSE  --    < > 104* 123* 189* 102* 109* 106*  ?BUN  --    < > 16 7* 5* 5* <5* 7*  ?CREATININE  --    < > 0.74 0.73 0.65 0.61 0.58 0.65  ?  CALCIUM  --    < > 8.2* 8.1* 7.9* 7.9* 8.1* 8.2*  ?MG 1.5*  --  1.9  --  1.7  --  1.8 2.1  ?PHOS  --   --   --   --   --   --  2.2* 3.7  ? < > = values in this interval not displayed.  ? ?Liver Function Tests: ?Recent Labs  ?Lab 11/02/21 ?0131 11/04/21 ?4388  ?AST 28 21  ?ALT 19 7  ?ALKPHOS 48 49  ?BILITOT 0.4 0.4  ?PROT 4.6* 4.3*  ?ALBUMIN 2.1* 1.8*  ? ?No results for input(s): LIPASE, AMYLASE in the last 168 hours. ?No results for input(s): AMMONIA in the last 168 hours. ?Cardiac Enzymes: ?No results for input(s): CKTOTAL, CKMB, CKMBINDEX, TROPONINI in the last 168 hours. ?BNP (last 3 results) ?No results for input(s): BNP in the last 8760 hours. ? ?ProBNP (last 3 results) ?No results for input(s): PROBNP in the last 8760 hours. ? ?CBG: ?Recent Labs  ?Lab 11/06/21 ?2123 11/07/21 ?0752 11/07/21 ?1056 11/07/21 ?1700 11/07/21 ?1735  ?GLUCAP 144* 105* 132* 69* 96  ? ?Recent Results (from the past 240 hour(s))  ?Culture, blood (Routine X 2) w Reflex  to ID Panel     Status: Abnormal  ? Collection Time: 10/31/21  2:20 PM  ? Specimen: BLOOD  ?Result Value Ref Range Status  ? Specimen Description BLOOD RIGHT ANTECUBITAL  Final  ? Special Requests   Final  ?  BOTTLES DRAWN A

## 2021-11-07 NOTE — Progress Notes (Signed)
Hypoglycemic Event ? ?CBG: 69 ? ?Treatment: 4 oz juice/soda ? ?Symptoms: None ? ?Follow-up CBG: Time 5:26 CBG Result:96 ? ? ?Possible Reasons for Event: Unknown ? ?Comments/MD notified: Dinner at bedside ? ? ? ?Quinn Quam RN ? ? ?

## 2021-11-07 NOTE — TOC Progression Note (Signed)
Transition of Care (TOC) - Progression Note  ? ? ?Patient Details  ?Name: Sara Merritt ?MRN: [id number] ?Date of Birth: [birth year] ? ?Transition of Care (TOC) CM/SW Contact  ?Milas Gain, LCSWA ?Phone Number: ?11/07/2021, 1:16 PM ? ?Clinical Narrative:    ? ? ?CSW will continue to follow and assist with patients dc planning needs. Plan for patient to return to [facility] with [place] orders when medically ready for dc. ? ? ?Expected Discharge Plan: Assisted Living (back to [place]) ?Barriers to Discharge: Continued Medical Work up ? ?Expected Discharge Plan and Services ?Expected Discharge Plan: Assisted Living (back to [place]) ?In-house Referral: Clinical Social Work ?  ?  ?Living arrangements for the past 2 months: [place] ([facility]) ?                ?  ?  ?  ?  ?  ?  ?  ?  ?  ?  ? ? ?Social Determinants of Health (SDOH) Interventions ?  ? ?Readmission Risk Interventions ?   ? View : No data to display.  ?  ?  ?  ? ? ?

## 2021-11-07 NOTE — Progress Notes (Signed)
Occupational Therapy Treatment ?Patient Details ?Name: Sara Merritt ?MRN: [id number] ?DOB: 04/29/1930 ?Today's Date: 11/07/2021 ? ? ?History of present illness 88 y.o. female  adm 3/22 with fatigue, weakness and dizziness. Pt tachycardic with hypotension in Afib with RVR. PMH:  breast CA, HTN, CKD, Afib, hypothyroidism, DM, and tachycardia. ?  ?OT comments ? Pt with continues to be fearful of falling, used stedy to transfer pt in absence of second person with pt stating, "I like that toy!" Pt completed grooming and UB dressing with set up to min assist in chair. Pt with HR from 110 to 167, non sustained. Pt without lightheadedness.   ? ?Recommendations for follow up therapy are one component of a multi-disciplinary discharge planning process, led by the attending physician.  Recommendations may be updated based on patient status, additional functional criteria and insurance authorization. ?   ?Follow Up Recommendations ? Home health OT (at ALF)  ?  ?Assistance Recommended at Discharge Frequent or constant Supervision/Assistance  ?Patient can return home with the following ? A lot of help with bathing/dressing/bathroom;Two people to help with walking and/or transfers;Assistance with cooking/housework;Direct supervision/assist for medications management;Direct supervision/assist for financial management;Assist for transportation;Help with stairs or ramp for entrance ?  ?Equipment Recommendations ? None recommended by OT  ?  ?Recommendations for Other Services   ? ?  ?Precautions / Restrictions Precautions ?Precautions: Fall ?Precaution Comments: watch HR and BP  ? ? ?  ? ?Mobility Bed Mobility ?Overal bed mobility: Needs Assistance ?Bed Mobility: Supine to Sit ?  ?  ?Supine to sit: Mod assist ?  ?  ?General bed mobility comments: cues for technique, assist to raise trunk and for hips to EOB with bed pad ?  ? ?Transfers ?Overall transfer level: Needs assistance ?  ?Transfers: Sit to/from Stand ?Sit to Stand:  Min assist, From elevated surface ?  ?  ?  ?  ?  ?General transfer comment: stood with stedy and assisted to chair ?  ?  ?Balance Overall balance assessment: Needs assistance ?  ?Sitting balance-Leahy Scale: Fair ?  ?  ?Standing balance support: Bilateral upper extremity supported ?Standing balance-Leahy Scale: Poor ?Standing balance comment: bil UE support in standing ?  ?  ?  ?  ?  ?  ?  ?  ?  ?  ?  ?   ? ?ADL either performed or assessed with clinical judgement  ? ?ADL Overall ADL's : Needs assistance/impaired ?Eating/Feeding: Set up;Sitting ?  ?Grooming: Brushing hair;Oral care;Wash/dry hands;Wash/dry face;Sitting;Set up ?  ?  ?  ?  ?  ?Upper Body Dressing : Minimal assistance;Sitting ?  ?Lower Body Dressing: Total assistance;Bed level ?  ?  ?  ?  ?  ?  ?  ?  ?  ?  ? ?Extremity/Trunk Assessment   ?  ?  ?  ?  ?  ? ?Vision   ?  ?  ?Perception   ?  ?Praxis   ?  ? ?Cognition Arousal/Alertness: Awake/alert ?Behavior During Therapy: Anxious, Flat affect ?Overall Cognitive Status: Impaired/Different from baseline ?Area of Impairment: Memory ?  ?  ?  ?  ?  ?  ?  ?  ?  ?  ?Memory: Decreased short-term memory ?  ?  ?  ?  ?General Comments: daughter assists with understanding medical issues, fearful of falling ?  ?  ?   ?Exercises   ? ?  ?Shoulder Instructions   ? ? ?  ?General Comments    ? ? ?Pertinent Vitals/ Pain  Pain Assessment ?Pain Assessment: No/denies pain ? ?Home Living   ?  ?  ?  ?  ?  ?  ?  ?  ?  ?  ?  ?  ?  ?  ?  ?  ?  ?  ? ?  ?Prior Functioning/Environment    ?  ?  ?  ?   ? ?Frequency ? Min 2X/week  ? ? ? ? ?  ?Progress Toward Goals ? ?OT Goals(current goals can now be found in the care plan section) ? Progress towards OT goals: Progressing toward goals ? ?Acute Rehab OT Goals ?OT Goal Formulation: With patient ?Time For Goal Achievement: 11/18/21 ?Potential to Achieve Goals: Good  ?Plan Discharge plan needs to be updated   ? ?Co-evaluation ? ? ?   ?  ?  ?  ?  ? ?  ?AM-PAC OT "6 Clicks" Daily Activity      ?Outcome Measure ? ? Help from another person eating meals?: None ?Help from another person taking care of personal grooming?: A Little ?Help from another person toileting, which includes using toliet, bedpan, or urinal?: A Lot ?Help from another person bathing (including washing, rinsing, drying)?: A Lot ?Help from another person to put on and taking off regular upper body clothing?: A Little ?Help from another person to put on and taking off regular lower body clothing?: A Lot ?6 Click Score: 16 ? ?  ?End of Session Equipment Utilized During Treatment: Gait belt ? ?OT Visit Diagnosis: Unsteadiness on feet (R26.81);Other abnormalities of gait and mobility (R26.89);Muscle weakness (generalized) (M62.81) ?  ?Activity Tolerance Treatment limited secondary to medical complications (Comment) (HR to 167) ?  ?Patient Left in chair;with call bell/phone within reach;with chair alarm set;with family/visitor present;with nursing/sitter in room ?  ?Nurse Communication Mobility status ?  ? ?   ? ?Time: 1010-1051 ?OT Time Calculation (min): 41 min ? ?Charges: OT General Charges ?$OT Visit: 1 Visit ?OT Treatments ?$Self Care/Home Management : 8-22 mins ?$Therapeutic Activity: 23-37 mins ? ?Sara Merritt, OTR/L ?Acute Rehabilitation Services ?Pager: [phone] ?Office: [phone]  ?Sara Merritt ?11/07/2021, 11:32 AM ?

## 2021-11-08 DIAGNOSIS — E119 Type 2 diabetes mellitus without complications: Secondary | ICD-10-CM | POA: Diagnosis not present

## 2021-11-08 DIAGNOSIS — I4891 Unspecified atrial fibrillation: Secondary | ICD-10-CM | POA: Diagnosis not present

## 2021-11-08 DIAGNOSIS — E876 Hypokalemia: Secondary | ICD-10-CM | POA: Diagnosis not present

## 2021-11-08 DIAGNOSIS — I824Y3 Acute embolism and thrombosis of unspecified deep veins of proximal lower extremity, bilateral: Secondary | ICD-10-CM | POA: Diagnosis not present

## 2021-11-08 LAB — BASIC METABOLIC PANEL
Anion gap: 7 (ref 5–15)
BUN: 6 mg/dL — ABNORMAL LOW (ref 8–23)
CO2: 25 mmol/L (ref 22–32)
Calcium: 8.1 mg/dL — ABNORMAL LOW (ref 8.9–10.3)
Chloride: 106 mmol/L (ref 98–111)
Creatinine, Ser: 0.64 mg/dL (ref 0.44–1.00)
GFR, Estimated: >60 mL/min (ref >60)
Glucose, Bld: 102 mg/dL — ABNORMAL HIGH (ref 70–99)
Potassium: 4.2 mmol/L (ref 3.5–5.1)
Sodium: 138 mmol/L (ref 135–145)

## 2021-11-08 LAB — GLUCOSE, CAPILLARY
Glucose-Capillary: 101 mg/dL — ABNORMAL HIGH (ref 70–99)
Glucose-Capillary: 104 mg/dL — ABNORMAL HIGH (ref 70–99)
Glucose-Capillary: 127 mg/dL — ABNORMAL HIGH (ref 70–99)
Glucose-Capillary: 161 mg/dL — ABNORMAL HIGH (ref 70–99)

## 2021-11-08 MED ORDER — SODIUM CHLORIDE 0.9 % IV SOLN: INTRAVENOUS | Status: DC

## 2021-11-08 NOTE — TOC Progression Note (Signed)
Transition of Care (TOC) - Progression Note  ? ? ?Patient Details  ?Name: Sara Merritt ?MRN: [id number] ?Date of Birth: July 17, 1930 ? ?Transition of Care (TOC) CM/SW Contact  ?Milas Gain, LCSWA ?Phone Number: ?11/08/2021, 5:01 PM ? ?Clinical Narrative:    ? ?  ?CSW will continue to follow and assist with patients dc planning needs. Plan for patient to return to [facility]F with [place] orders when medically ready for dc. ? ?Expected Discharge Plan: Assisted Living (back to [place]) ?Barriers to Discharge: Continued Medical Work up ? ?Expected Discharge Plan and Services ?Expected Discharge Plan: Assisted Living (back to [place]) ?In-house Referral: Clinical Social Work ?  ?  ?Living arrangements for the past 2 months: [place] ([facility]) ?                ?  ?  ?  ?  ?  ?  ?  ?  ?  ?  ? ? ?Social Determinants of Health (SDOH) Interventions ?  ? ?Readmission Risk Interventions ?   ? View : No data to display.  ?  ?  ?  ? ? ?

## 2021-11-08 NOTE — Progress Notes (Signed)
?PROGRESS NOTE ? ?Sara Merritt LEX:[id number] DOB: 1929/11/04 DOA: 10/31/2021 ?PCP: Marco Collie, MD ? ? LOS: 7 days  ? ?Brief narrative: ? ?Sara Merritt is a 88 y.o. female with medical history significant of breast cancer status post lumpectomy, hypertension, CKD, atrial fibrillation, hypothyroidism, diabetes mellitus with tachycardia presented to the hospital feeling unwell, fatigue and weakness with dizziness on standing.  EMS was called in and patient was noted to be tachycardic in the 200s with hypotension.    In the ED, patient had transient hypotension after receiving Cardizem drip so was started on amiodarone drip.  Initial labs showed a potassium of 3.2, INR at the 1.6 and lactate elevated at 2.2 and 2.4.  Chest x-ray showed cardiomegaly.  Patient also received 500 cc of fluid in the ED and 30 mEq of potassium and was admitted hospital for further evaluation and treatment.   ? ?During hospitalization, patient remained hypotensive and required multiple fluid boluses.  Blood culture was positive in 1 bottle for staph so vancomycin was initiated.  Patient was also given digoxin 0.25 mg x 3 .  Cardiology followed the patient during hospitalization and was continued on amiodarone drip with subsequent change to metoprolol.  Patient continues to have tachycardia especially on minimal exertion and Cardiology is adjusting medications. ? ?Assessment/Plan: ? ?Principal Problem: ?  Atrial fibrillation with rapid ventricular response ([place]) ?Active Problems: ?  HTN (hypertension) ?  Hypothyroidism ?  Diabetes ([place]) ?  GERD (gastroesophageal reflux disease) ?  Hypokalemia ?  Leg pain, diffuse, left ?  Bacteremia ?  Acute deep vein thrombosis (DVT) of proximal vein of lower extremity (HCC) ? ?Atrial fibrillation with RVR ?On presentation initial RVR up to 200s with hypotension.  Still remains tachycardic and currently cardiology adjusting medications.  Metoprolol increased to 75 mg twice daily.   2D  echocardiogram showed LV ejection fraction of 55 to 60% with no regional wall motion abnormality with severely elevated pulmonary artery systolic pressure.  CHA2DS2-VASc score of 5.  On Eliquis at this time.  Was switched from Coumadin.  Since heart rate remains uncontrolled on exertion, she was started on oral amiodarone. Will provide some gentle hydration and check cortisol to see if contributing to hypotension/tachycardia. ? If patient would benefit from cardioversion, defer to cardiology  ? ?Lower extremity pain.  Secondary to DVT.   ?Ultrasound of the lower extremity showed evidence of acute DVT involving the right popliteal vein, indeterminate left femoral and proximal profunda DVT.  Patient was supposed to be on Coumadin as outpatient but was subtherapeutic.  Coumadin has been changed to Eliquis at this time. ? ?Gram-positive bacterial culture x 1 bottle,  Staph hemolyticus.  Likely contaminant. No true bacteremia. Off antibiotic at this time.  Afebrile. No leukocytosis ? ?Lactic acidosis. Improved with IV fluids.  ? ?Hypokalemia ?Improved after replacement.  ?  ?Hypothyroidism ?Continue Synthroid. ? ?Hypertension ?Currently improved blood pressure, had hypotension on presentation and required IV fluid bolus and amiodarone.  Blood pressures initially improved, but running low again today. Holding metoprolol today ? ?Diabetes mellitus type II ?Continue sliding scale insulin for now.  Overall controlled.   ? ?GERD ?Continue PPI. ? ?Hyperlipidemia ?Simvastatin has been changed to atorvastatin ? ?Hypomagnesemia. ?Improved after replacement.   ? ?Poor IV access.  Required PICC line. ? ?Hypophosphatemia.  replaced ? ?Deconditioning, debility PT, OT has seen the patient at this time.  From assisted living facility.  Will need home health services in assisted living facility on discharge. ? ?Disposition  assisted living facility with home health services on discharge when okay with cardiology.  Likely in 1 to 2  days ? ?DVT prophylaxis: Place TED hose Start: 11/03/21 1347 ?apixaban (ELIQUIS) tablet 5 mg  ? ? ?Code Status: Full code ? ?Family Communication:  ?Spoke with the patient's  daughter at bedside on 11/08/2021 ? ?Status is: Inpatient ? ?The patient is  inpatient because: Atrial fibrillation with RVR, new DVT, cardiology follow-up and titration of medication ?  ?Consultants: ?Cardiology ? ?Procedures: ?None ? ?Anti-infectives: ? ?None ? ?Anti-infectives (From admission, onward)  ? ? Start     Dose/Rate Route Frequency Ordered Stop  ? 11/01/21 2030  ceFAZolin (ANCEF) IVPB 2g/100 mL premix  Status:  Discontinued       ? 2 g ?200 mL/hr over 30 Minutes Intravenous Every 8 hours 11/01/21 1947 11/03/21 1044  ? 11/01/21 2000  ceFAZolin (ANCEF) IVPB 2g/100 mL premix  Status:  Discontinued       ? 2 g ?200 mL/hr over 30 Minutes Intravenous 2 times daily 11/01/21 1848 11/01/21 1947  ? ?  ? ? ?Subjective: ?She became dizzy/lightheaded this morning when she was getting out of bed and into chair. Noted to be tachycardic and blood pressures running low. Metoprolol held this morning ? ?Objective: ?Vitals:  ? 11/08/21 1057 11/08/21 1100  ?BP:  (!) 86/65  ?Pulse: (!) 154 (!) 115  ?Resp:  20  ?Temp:  98.3 ?F (36.8 ?C)  ?SpO2:  90%  ? ? ?Intake/Output Summary (Last 24 hours) at 11/08/2021 1136 ?Last data filed at 11/07/2021 2116 ?Gross per 24 hour  ?Intake 265 ml  ?Output --  ?Net 265 ml  ? ?Filed Weights  ? 11/03/21 0500 11/06/21 0355 11/07/21 0315  ?Weight: 81.1 kg 79.7 kg 78.8 kg  ? ?Body mass index is 29.82 kg/m?.  ? ?Physical Exam: ? ?General exam: Alert, awake, oriented x 3 ?Respiratory system: Clear to auscultation. Respiratory effort normal. ?Cardiovascular system:irregular. No murmurs, rubs, gallops. ?Gastrointestinal system: Abdomen is nondistended, soft and nontender. No organomegaly or masses felt. Normal bowel sounds heard. ?Central nervous system: Alert and oriented. No focal neurological deficits. ?Extremities: No C/C/E,  +pedal pulses ?Skin: No rashes, lesions or ulcers ?Psychiatry: Judgement and insight appear normal. Mood & affect appropriate.  ? ? ? ?Data Review: I have personally reviewed the following laboratory data and studies, ? ?CBC: ?Recent Labs  ?Lab 11/02/21 ?6010 11/04/21 ?9323 11/05/21 ?5573 11/06/21 ?2202 11/07/21 ?0325  ?WBC 7.4 5.4 5.5 5.5 6.2  ?HGB 11.3* 11.0* 11.4* 11.2* 11.2*  ?HCT 35.1* 33.0* 34.5* 33.5* 34.3*  ?MCV 107.7* 105.8* 105.8* 106.0* 106.9*  ?PLT 123* 113* 129* 140* 152  ? ?Basic Metabolic Panel: ?Recent Labs  ?Lab 11/02/21 ?5427 11/03/21 ?0623 11/04/21 ?7628 11/05/21 ?3151 11/06/21 ?7616 11/07/21 ?0737 11/08/21 ?1062  ?NA 138   < > 136 140 138 139 138  ?K 4.9   < > 3.7 4.2 4.0 4.1 4.2  ?CL 111   < > 107 113* 109 108 106  ?CO2 21*   < > '24 24 25 26 25  '$ ?GLUCOSE 104*   < > 189* 102* 109* 106* 102*  ?BUN 16   < > 5* 5* <5* 7* 6*  ?CREATININE 0.74   < > 0.65 0.61 0.58 0.65 0.64  ?CALCIUM 8.2*   < > 7.9* 7.9* 8.1* 8.2* 8.1*  ?MG 1.9  --  1.7  --  1.8 2.1  --   ?PHOS  --   --   --   --  2.2* 3.7  --   ? < > = values in this interval not displayed.  ? ?Liver Function Tests: ?Recent Labs  ?Lab 11/02/21 ?1194 11/04/21 ?1740  ?AST 28 21  ?ALT 19 7  ?ALKPHOS 48 49  ?BILITOT 0.4 0.4  ?PROT 4.6* 4.3*  ?ALBUMIN 2.1* 1.8*  ? ?No results for input(s): LIPASE, AMYLASE in the last 168 hours. ?No results for input(s): AMMONIA in the last 168 hours. ?Cardiac Enzymes: ?No results for input(s): CKTOTAL, CKMB, CKMBINDEX, TROPONINI in the last 168 hours. ?BNP (last 3 results) ?No results for input(s): BNP in the last 8760 hours. ? ?ProBNP (last 3 results) ?No results for input(s): PROBNP in the last 8760 hours. ? ?CBG: ?Recent Labs  ?Lab 11/07/21 ?1056 11/07/21 ?1700 11/07/21 ?1735 11/07/21 ?2202 11/08/21 ?8144  ?GLUCAP 132* 69* 96 105* 101*  ? ?Recent Results (from the past 240 hour(s))  ?Culture, blood (Routine X 2) w Reflex to ID Panel     Status: Abnormal  ? Collection Time: 10/31/21  2:20 PM  ? Specimen: BLOOD  ?Result  Value Ref Range Status  ? Specimen Description BLOOD RIGHT ANTECUBITAL  Final  ? Special Requests   Final  ?  BOTTLES DRAWN AEROBIC AND ANAEROBIC Blood Culture results may not be optimal due to an excessive vol

## 2021-11-08 NOTE — Progress Notes (Signed)
? ?Progress Note ? ?Patient Name: Sara Merritt ?Date of Encounter: 11/08/2021 ? ?[facility] Cardiologist: Elouise Munroe, MD  ? ?Subjective  ? ?No chest pain or SOB.  Did report some lightheadedness this morning, BP down to 80s ? ?Inpatient Medications  ?  ?Scheduled Meds: ? allopurinol  200 mg Oral Daily  ? amiodarone  200 mg Oral BID  ? apixaban  5 mg Oral BID  ? atorvastatin  10 mg Oral Daily  ? calcium carbonate  1,250 mg Oral Daily  ? Chlorhexidine Gluconate Cloth  6 each Topical Daily  ? feeding supplement  1 Container Oral TID BM  ? Gerhardt's butt cream   Topical TID  ? insulin aspart  0-9 Units Subcutaneous TID WC  ? levothyroxine  75 mcg Oral Daily  ? magnesium oxide  400 mg Oral BID  ? metoprolol succinate  75 mg Oral BID  ? pantoprazole  40 mg Oral Daily  ? sodium chloride flush  10-40 mL Intracatheter Q12H  ? ?Continuous Infusions: ? sodium chloride 100 mL/hr at 11/08/21 1202  ? ?PRN Meds: ?acetaminophen, ondansetron (ZOFRAN) IV, sodium chloride flush  ? ?Vital Signs  ?  ?Vitals:  ? 11/08/21 0745 11/08/21 1020 11/08/21 1057 11/08/21 1100  ?BP: (!) 93/54 (!) 81/55  (!) 86/65  ?Pulse: 87 100 (!) 154 (!) 115  ?Resp: 16   20  ?Temp: 98.3 ?F (36.8 ?C)   98.3 ?F (36.8 ?C)  ?TempSrc: Oral   Oral  ?SpO2: 96%   90%  ?Weight:      ?Height:      ? ? ?Intake/Output Summary (Last 24 hours) at 11/08/2021 1236 ?Last data filed at 11/07/2021 2116 ?Gross per 24 hour  ?Intake 265 ml  ?Output --  ?Net 265 ml  ? ? ? ?  11/07/2021  ?  3:15 AM 11/06/2021  ?  3:55 AM 11/03/2021  ?  5:00 AM  ?Last 3 Weights  ?Weight (lbs) 173 lb 11.6 oz 175 lb 11.3 oz 178 lb 12.7 oz  ?Weight (kg) 78.8 kg 79.7 kg 81.1 kg  ?   ? ?Telemetry  ?  ?Atrial fib rates 80s to 100s when resting, up to 140s when she gets up- Personally Reviewed ? ?ECG  ?  ?No new - Personally Reviewed ? ?Physical Exam  ? ?GEN: No acute distress.   ?Neck: No JVD ?Cardiac: irreg irreg, no murmurs, rubs, or gallops.  ?Respiratory: Clear to auscultation  bilaterally. ?GI: Soft, nontender, non-distended  ?MS: No edema; No deformity. ?Neuro:  Nonfocal  ?Psych: Normal affect  ? ?Labs  ?  ?High Sensitivity Troponin:  No results for input(s): TROPONINIHS in the last 720 hours.   ?Chemistry ?Recent Labs  ?Lab 11/02/21 ?9983 11/03/21 ?3825 11/04/21 ?0539 11/05/21 ?7673 11/06/21 ?4193 11/07/21 ?7902 11/08/21 ?4097  ?NA 138   < > 136   < > 138 139 138  ?K 4.9   < > 3.7   < > 4.0 4.1 4.2  ?CL 111   < > 107   < > 109 108 106  ?CO2 21*   < > 24   < > '25 26 25  '$ ?GLUCOSE 104*   < > 189*   < > 109* 106* 102*  ?BUN 16   < > 5*   < > <5* 7* 6*  ?CREATININE 0.74   < > 0.65   < > 0.58 0.65 0.64  ?CALCIUM 8.2*   < > 7.9*   < > 8.1* 8.2* 8.1*  ?  MG 1.9  --  1.7  --  1.8 2.1  --   ?PROT 4.6*  --  4.3*  --   --   --   --   ?ALBUMIN 2.1*  --  1.8*  --   --   --   --   ?AST 28  --  21  --   --   --   --   ?ALT 19  --  7  --   --   --   --   ?ALKPHOS 48  --  49  --   --   --   --   ?BILITOT 0.4  --  0.4  --   --   --   --   ?GFRNONAA >60   < > >60   < > >60 >60 >60  ?ANIONGAP 6   < > 5   < > 4* 5 7  ? < > = values in this interval not displayed.  ? ?  ?Lipids No results for input(s): CHOL, TRIG, HDL, LABVLDL, LDLCALC, CHOLHDL in the last 168 hours.  ?Hematology ?Recent Labs  ?Lab 11/05/21 ?0416 11/06/21 ?0356 11/07/21 ?0325  ?WBC 5.5 5.5 6.2  ?RBC 3.26* 3.16* 3.21*  ?HGB 11.4* 11.2* 11.2*  ?HCT 34.5* 33.5* 34.3*  ?MCV 105.8* 106.0* 106.9*  ?MCH 35.0* 35.4* 34.9*  ?MCHC 33.0 33.4 32.7  ?RDW 14.2 14.3 14.4  ?PLT 129* 140* 152  ? ? ?Thyroid  ?Recent Labs  ?Lab 11/03/21 ?0751  ?TSH 6.793*  ?FREET4 1.35*  ? ?  ?BNPNo results for input(s): BNP, PROBNP in the last 168 hours.  ?DDimer No results for input(s): DDIMER in the last 168 hours.  ? ?Radiology  ?  ?No results found. ? ?Cardiac Studies  ? ? ?TTE 11/01/21 ?IMPRESSIONS  ? ? ? 1. Left ventricular ejection fraction, by estimation, is 55 to 60%. The  ?left ventricle has normal function. The left ventricle has no regional  ?wall motion  abnormalities. Left ventricular diastolic parameters are  ?indeterminate. Elevated left ventricular  ?end-diastolic pressure.  ? 2. Right ventricular systolic function is normal. The right ventricular  ?size is moderately enlarged. There is severely elevated pulmonary artery  ?systolic pressure.  ? 3. Left atrial size was severely dilated.  ? 4. Right atrial size was severely dilated.  ? 5. The mitral valve is normal in structure. Trivial mitral valve  ?regurgitation. No evidence of mitral stenosis. Moderate mitral annular  ?calcification.  ? 6. Tricuspid valve regurgitation is moderate.  ? 7. The aortic valve is calcified. There is moderate calcification of the  ?aortic valve. There is moderate thickening of the aortic valve. Aortic  ?valve regurgitation is trivial. Moderate aortic valve stenosis.  ? 8. The inferior vena cava is normal in size with greater than 50%  ?respiratory variability, suggesting right atrial pressure of 3 mmHg.  ? ?FINDINGS  ? Left Ventricle: Left ventricular ejection fraction, by estimation, is 55  ?to 60%. The left ventricle has normal function. The left ventricle has no  ?regional wall motion abnormalities. The left ventricular internal cavity  ?size was normal in size. There is  ? no left ventricular hypertrophy. Left ventricular diastolic function  ?could not be evaluated due to atrial fibrillation. Left ventricular  ?diastolic parameters are indeterminate. Elevated left ventricular  ?end-diastolic pressure.  ? ?Right Ventricle: The right ventricular size is moderately enlarged. No  ?increase in right ventricular wall thickness. Right ventricular systolic  ?function is normal. There is severely elevated  pulmonary artery systolic  ?pressure. The tricuspid regurgitant  ?velocity is 4.61 m/s, and with an assumed right atrial pressure of 3 mmHg,  ?the estimated right ventricular systolic pressure is 18.5 mmHg.  ? ?Left Atrium: Left atrial size was severely dilated.  ? ?Right Atrium: Right  atrial size was severely dilated.  ? ?Pericardium: There is no evidence of pericardial effusion.  ? ?Mitral Valve: The mitral valve is normal in structure. Moderate mitral  ?annular calcification. Trivial mitral valve regurgitation. No evidence of  ?mitral valve stenosis.  ? ?Tricuspid Valve: The tricuspid valve is normal in structure. Tricuspid  ?valve regurgitation is moderate . No evidence of tricuspid stenosis.  ? ?Aortic Valve: The aortic valve is calcified. There is moderate  ?calcification of the aortic valve. There is moderate thickening of the  ?aortic valve. Aortic valve regurgitation is trivial. Aortic regurgitation  ?PHT measures 388 msec. Moderate aortic  ?stenosis is present. Aortic valve mean gradient measures 16.5 mmHg. Aortic  ?valve peak gradient measures 26.7 mmHg. Aortic valve area, by VTI measures  ?0.95 cm?.  ? ?Pulmonic Valve: The pulmonic valve was normal in structure. Pulmonic valve  ?regurgitation is not visualized. No evidence of pulmonic stenosis.  ? ?Aorta: The aortic root is normal in size and structure.  ? ?Venous: The inferior vena cava is normal in size with greater than 50%  ?respiratory variability, suggesting right atrial pressure of 3 mmHg.  ? ?IAS/Shunts: No atrial level shunt detected by color flow Doppler.  ?Patient Profile  ?   ?88 y.o. female with a history of persistent/permanent atrial fibrillation on Coumadin, hypertension, pulmonary hypertension, hyperlipidemia, type 2 diabetes mellitus, CKD stage II-III,  hypothyroidism, and breast cancer s/p lumpectomy now with atrial fib.  ?  ? ?Assessment & Plan  ? Persistent/ Permanent Atrial Fibrillation ?Patient reportedly has had atrial fibrillation since the 1950s, in her 32s perhaps. Unclear if atrial fibrillation is permanent at this point. Patient now presents with rapid atrial fibrillation with rates reportedly in the 200s and was hypotensive in the field. Started on IV Amiodarone. Echo shows normal LV function. ?-Maintain K  greater than 4, mag greater than 2 ?-TSH elevated ?- BP appears improved, added metoprolol ?- Given advanced age and concomitant use of amiodarone, discontinued digoxin due to about potential toxicities.   ?- CHA2DS-VASc =

## 2021-11-08 NOTE — Progress Notes (Signed)
Physical Therapy Treatment ?Patient Details ?Name: Sara Merritt ?MRN: [id number] ?DOB: Dec 24, 1929 ?Today's Date: 11/08/2021 ? ? ?History of Present Illness 88 y.o. female  adm 3/22 with fatigue, weakness and dizziness. Pt tachycardic with hypotension in Afib with RVR. PMH:  breast CA, HTN, CKD, Afib, hypothyroidism, DM, and tachycardia. ? ?  ?PT Comments  ? ? Pt pleasant and willing to get up stating no dizziness prior date but did have dizziness with transition to sitting and standing this session with HR 120-154 with minimal activity with return to 115-130 with rest. Pt able to perform standing with increased ease with 2 person assist partially due to pt comfort level with fear of falling. Pt and daughter educated for pivot transfers, lateral scoots and stedy use pending pt functional level at the time as daughter states it fluctuates throughout the day and with fatigue.  ?   ?Recommendations for follow up therapy are one component of a multi-disciplinary discharge planning process, led by the attending physician.  Recommendations may be updated based on patient status, additional functional criteria and insurance authorization. ? ?Follow Up Recommendations ? Home health PT (pt requesting HH at ALF and not SNF) ?  ?  ?Assistance Recommended at Discharge Frequent or constant Supervision/Assistance  ?Patient can return home with the following Two people to help with walking and/or transfers;A lot of help with bathing/dressing/bathroom;Help with stairs or ramp for entrance;Assist for transportation;Assistance with cooking/housework ?  ?Equipment Recommendations ? None recommended by PT  ?  ?Recommendations for Other Services   ? ? ?  ?Precautions / Restrictions Precautions ?Precautions: Fall ?Precaution Comments: watch HR and BP ?Restrictions ?Weight Bearing Restrictions: No  ?  ? ?Mobility ? Bed Mobility ?Overal bed mobility: Needs Assistance ?Bed Mobility: Supine to Sit ?  ?  ?Supine to sit: Min assist ?  ?   ?General bed mobility comments: use of rail and single UE assist to rise into sitting from supine with HOB 15 degrees, increased time with initial dizzines with sitting ?  ? ?Transfers ?Overall transfer level: Needs assistance ?  ?Transfers: Sit to/from Stand, Bed to chair/wheelchair/BSC ?Sit to Stand: Min assist ?  ?Step pivot transfers: Min assist, +2 physical assistance ?  ?  ?  ?General transfer comment: pt with intial stand from bed with min assist and min +2 pivot with RW present with pt taking repeated steps to get to chair but denied attempting ambulation due to dizziness. Repeated attempt at standing with +1 assist pt able to lift sacrum from surface but could not stand upright. With 3rd trial from chair and +2 assist pt able to stand fully upright with min +2. ?  ? ?Ambulation/Gait ?  ?  ?  ?  ?  ?  ?  ?General Gait Details: unable ? ? ?Stairs ?  ?  ?  ?  ?  ? ? ?Wheelchair Mobility ?  ? ?Modified Rankin (Stroke Patients Only) ?  ? ? ?  ?Balance Overall balance assessment: Needs assistance ?  ?Sitting balance-Leahy Scale: Fair ?Sitting balance - Comments: EOB without UE support ?  ?Standing balance support: Bilateral upper extremity supported ?  ?Standing balance comment: bil UE support in standing ?  ?  ?  ?  ?  ?  ?  ?  ?  ?  ?  ?  ? ?  ?Cognition Arousal/Alertness: Awake/alert ?Behavior During Therapy: [facility] for tasks assessed/performed ?Overall Cognitive Status: Impaired/Different from baseline ?Area of Impairment: Memory ?  ?  ?  ?  ?  ?  ?  ?  ?  ?  ?  Memory: Decreased short-term memory ?  ?  ?  ?  ?General Comments: fearful of falling ?  ?  ? ?  ?Exercises General Exercises - Lower Extremity ?Long Arc Quad: AROM, Both, Seated, 15 reps ?Hip Flexion/Marching: AROM, Both, 15 reps, Seated ? ?  ?General Comments   ?  ?  ? ?Pertinent Vitals/Pain Pain Assessment ?Pain Assessment: No/denies pain  ? ? ?Home Living   ?  ?  ?  ?  ?  ?  ?  ?  ?  ?   ?  ?Prior Function    ?  ?  ?   ? ?PT Goals (current goals can  now be found in the care plan section) Progress towards PT goals: Progressing toward goals ? ?  ?Frequency ? ? ? Min 3X/week ? ? ? ?  ?PT Plan Discharge plan needs to be updated  ? ? ?Co-evaluation   ?  ?  ?  ?  ? ?  ?AM-PAC PT "6 Clicks" Mobility   ?Outcome Measure ? Help needed turning from your back to your side while in a flat bed without using bedrails?: A Little ?Help needed moving from lying on your back to sitting on the side of a flat bed without using bedrails?: A Little ?Help needed moving to and from a bed to a chair (including a wheelchair)?: A Lot ?Help needed standing up from a chair using your arms (e.g., wheelchair or bedside chair)?: A Little ?Help needed to walk in hospital room?: Total ?Help needed climbing 3-5 steps with a railing? : Total ?6 Click Score: 13 ? ?  ?End of Session Equipment Utilized During Treatment: Gait belt ?Activity Tolerance: Patient limited by fatigue (dizziness) ?Patient left: in chair;with call bell/phone within reach;with chair alarm set;with family/visitor present ?Nurse Communication: Mobility status ?PT Visit Diagnosis: Muscle weakness (generalized) (M62.81);Other abnormalities of gait and mobility (R26.89) ?  ? ? ?Time: 8921-1941 ?PT Time Calculation (min) (ACUTE ONLY): 28 min ? ?Charges:  $Therapeutic Activity: 23-37 mins          ?          ? ?Kamryn Gauthier P, PT ?Acute Rehabilitation Services ?Pager: [phone] ?Office: [phone] ? ? ? ?Maxfield Gildersleeve B Yonatan Guitron ?11/08/2021, 11:04 AM ? ?

## 2021-11-09 DIAGNOSIS — E119 Type 2 diabetes mellitus without complications: Secondary | ICD-10-CM | POA: Diagnosis not present

## 2021-11-09 DIAGNOSIS — E876 Hypokalemia: Secondary | ICD-10-CM | POA: Diagnosis not present

## 2021-11-09 DIAGNOSIS — I4891 Unspecified atrial fibrillation: Secondary | ICD-10-CM | POA: Diagnosis not present

## 2021-11-09 DIAGNOSIS — I824Y3 Acute embolism and thrombosis of unspecified deep veins of proximal lower extremity, bilateral: Secondary | ICD-10-CM | POA: Diagnosis not present

## 2021-11-09 DIAGNOSIS — Z23 Encounter for immunization: Secondary | ICD-10-CM | POA: Diagnosis present

## 2021-11-09 LAB — GLUCOSE, CAPILLARY
Glucose-Capillary: 129 mg/dL — ABNORMAL HIGH (ref 70–99)
Glucose-Capillary: 115 mg/dL — ABNORMAL HIGH (ref 70–99)
Glucose-Capillary: 118 mg/dL — ABNORMAL HIGH (ref 70–99)

## 2021-11-09 LAB — CBC
HCT: 30.2 % — ABNORMAL LOW (ref 36.0–46.0)
Hemoglobin: 10.2 g/dL — ABNORMAL LOW (ref 12.0–15.0)
MCH: 35.8 pg — ABNORMAL HIGH (ref 26.0–34.0)
MCHC: 33.8 g/dL (ref 30.0–36.0)
MCV: 106 fL — ABNORMAL HIGH (ref 80.0–100.0)
Platelets: 154 10*3/uL (ref 150–400)
RBC: 2.85 MIL/uL — ABNORMAL LOW (ref 3.87–5.11)
RDW: 14.2 % (ref 11.5–15.5)
WBC: 6.3 10*3/uL (ref 4.0–10.5)
nRBC: 0 % (ref 0.0–0.2)

## 2021-11-09 LAB — BASIC METABOLIC PANEL
Anion gap: 7 (ref 5–15)
BUN: 6 mg/dL — ABNORMAL LOW (ref 8–23)
CO2: 25 mmol/L (ref 22–32)
Calcium: 7.6 mg/dL — ABNORMAL LOW (ref 8.9–10.3)
Chloride: 107 mmol/L (ref 98–111)
Creatinine, Ser: 0.57 mg/dL (ref 0.44–1.00)
GFR, Estimated: >60 mL/min (ref >60)
Glucose, Bld: 99 mg/dL (ref 70–99)
Potassium: 3.5 mmol/L (ref 3.5–5.1)
Sodium: 139 mmol/L (ref 135–145)

## 2021-11-09 LAB — CORTISOL-AM, BLOOD: Cortisol - AM: 11.7 ug/dL (ref 6.7–22.6)

## 2021-11-09 LAB — MAGNESIUM: Magnesium: 1.7 mg/dL (ref 1.7–2.4)

## 2021-11-09 MED ORDER — APIXABAN 5 MG PO TABS
5.0000 mg | ORAL_TABLET | Freq: Two times a day (BID) | ORAL | 0 refills | Status: AC
Start: 2021-11-09 — End: ?

## 2021-11-09 MED ORDER — ATORVASTATIN CALCIUM 10 MG PO TABS: 10.0000 mg | ORAL_TABLET | Freq: Every day | ORAL | 0 refills | Status: AC

## 2021-11-09 MED ORDER — AMIODARONE HCL 200 MG PO TABS
200.0000 mg | ORAL_TABLET | Freq: Every day | ORAL | 0 refills | Status: DC
Start: 2021-11-10 — End: 2021-11-20

## 2021-11-09 MED ORDER — MAGNESIUM SULFATE 2 GM/50ML IV SOLN: 2.0000 g | Freq: Once | INTRAVENOUS | Status: DC

## 2021-11-09 MED ORDER — POTASSIUM CHLORIDE CRYS ER 20 MEQ PO TBCR
40.0000 meq | EXTENDED_RELEASE_TABLET | ORAL | Status: AC
  Administered 2021-11-09 (×2): 40 meq via ORAL
  Filled 2021-11-09 (×2): qty 2

## 2021-11-09 MED ORDER — METOPROLOL SUCCINATE ER 25 MG PO TB24
75.0000 mg | ORAL_TABLET | Freq: Two times a day (BID) | ORAL | 0 refills | Status: AC
Start: 2021-11-09 — End: ?

## 2021-11-09 MED ORDER — AMIODARONE HCL 200 MG PO TABS: 200.0000 mg | ORAL_TABLET | Freq: Every day | ORAL | Status: DC

## 2021-11-09 NOTE — Final Progress Note (Addendum)
Called again to [facility] and gave report to [facility].  ?Called report to [facility] tele# [phone] asked for [facility]. Placed on hold for 10 minutes. Unable to leave message.  ?

## 2021-11-09 NOTE — TOC Transition Note (Signed)
Transition of Care (TOC) - CM/SW Discharge Note ? ? ?Patient Details  ?Name: Sara Merritt ?MRN: [id number] ?Date of Birth: Apr 09, 1930 ? ?Transition of Care (TOC) CM/SW Contact:  ?Milas Gain, LCSWA ?Phone Number: ?11/09/2021, 1:52 PM ? ? ?Clinical Narrative:    ? ?Patient will DC to: [facility]  ? ?Anticipated DC date: 11/09/2021 ? ?Family notified: Les ? ?Transport by: Corey Harold ? ?? ? ?Per MD patient ready for DC to [facility] with [place] orders . RN, patient, patient's family, and facility notified of DC. Discharge Summary,FL2,Covid Results, and [place] orders sent to facility. RN given number for report tele# [phone] ask for [facility]. DC packet on chart. Ambulance transport requested for patient. ? ?CSW signing off.  ? ?Final next level of care: Assisted Living ([facility]) ?Barriers to Discharge: Continued Medical Work up ? ? ?Patient Goals and CMS Choice ?  ?CMS Medicare.gov Compare Post Acute Care list provided to:: Patient Represenative (must comment) (daughter [name]) ?Choice offered to / list presented to : Adult Children (Les) ? ?Discharge Placement ?  ?           ?Patient chooses bed at: [facility] ?Patient to be transferred to facility by: [facility] ?Name of family member notified: Les ?Patient and family notified of of transfer: 11/09/21 ? ?Discharge Plan and Services ?In-house Referral: Clinical Social Work ?  ?           ?  ?  ?  ?  ?  ?  ?  ?  ?  ?  ? ?Social Determinants of Health (SDOH) Interventions ?  ? ? ?Readmission Risk Interventions ?   ? View : No data to display.  ?  ?  ?  ? ? ? ? ? ?

## 2021-11-09 NOTE — Discharge Summary (Signed)
Physician Discharge Summary  ?Sara Merritt SPQ:[id number] DOB: 10-27-1929 DOA: 10/31/2021 ? ?PCP: Marco Collie, MD ? ?Admit date: 10/31/2021 ?Discharge date: 11/09/2021 ? ?Admitted From: ALF ?Disposition:  ALF ? ?Recommendations for Outpatient Follow-up:  ?Follow up with PCP in 1-2 weeks ?Please obtain BMP/CBC in one week ?Follow up in A fib clinic on 11/20/21 ? ?Home Health:PT/OT ?Equipment/Devices: ? ?Discharge Condition:stable ?CODE STATUS:full code ?Diet recommendation: heart healthy, carb modified ? ?Brief/Interim Summary: ?Sara Merritt is a 88 y.o. female with medical history significant of breast cancer status post lumpectomy, hypertension, CKD, atrial fibrillation, hypothyroidism, diabetes mellitus with tachycardia presented to the hospital feeling unwell, fatigue and weakness with dizziness on standing.  EMS was called in and patient was noted to be tachycardic in the 200s with hypotension.    In the ED, patient had transient hypotension after receiving Cardizem drip so was started on amiodarone drip.  Initial labs showed a potassium of 3.2, INR at the 1.6 and lactate elevated at 2.2 and 2.4.  Chest x-ray showed cardiomegaly.  Patient also received 500 cc of fluid in the ED and 30 mEq of potassium and was admitted hospital for further evaluation and treatment.   ?  ?During hospitalization, patient remained hypotensive and required multiple fluid boluses.  Blood culture was positive in 1 bottle for staph so vancomycin was initiated.  Patient was also given digoxin 0.25 mg x 3 .  Cardiology followed the patient during hospitalization and was continued on amiodarone drip with subsequent change to metoprolol.  Her rate/BP further improved with IV fluids. She is currently on toprol and amiodarone and will follow up in A fib clinic ? ?Discharge Diagnoses:  ?Principal Problem: ?  Atrial fibrillation with rapid ventricular response ([place]) ?Active Problems: ?  HTN (hypertension) ?  Hypothyroidism ?   Diabetes ([place]) ?  GERD (gastroesophageal reflux disease) ?  Hypokalemia ?  Leg pain, diffuse, left ?  Bacteremia ?  Acute deep vein thrombosis (DVT) of proximal vein of lower extremity (HCC) ? ?Atrial fibrillation with RVR ?On presentation initial RVR up to 200s with hypotension.  Cardiology followed and adjusted medications.  Metoprolol increased to 75 mg twice daily. Amiodarone also added to regimen. 2D echocardiogram showed LV ejection fraction of 55 to 60% with no regional wall motion abnormality with severely elevated pulmonary artery systolic pressure.  CHA2DS2-VASc score of 5.  On Eliquis at this time.  Was switched from Coumadin.  Overall HR and BP further improved with IV fluids since she appeared to be volume depleted. Would hold off on resuming lasix at this time. She still has episodes of tachycardia on exertion, likely related to deconditioning. These episodes resolve on rest. She has follow up scheduled in A fib clinic.  ?  ?Lower extremity pain.  Secondary to DVT.   ?Ultrasound of the lower extremity showed evidence of acute DVT involving the right popliteal vein, indeterminate left femoral and proximal profunda DVT.  Patient was supposed to be on Coumadin as outpatient but was subtherapeutic.  Coumadin has been changed to Eliquis at this time. ?  ?Gram-positive bacterial culture x 1 bottle,  Staph hemolyticus.  Likely contaminant. No true bacteremia. Off antibiotic at this time.  Afebrile. No leukocytosis ?  ?Lactic acidosis. Improved with IV fluids.  ?  ?Hypokalemia ?Improved after replacement.  ?  ?Hypothyroidism ?Continue Synthroid. ? ?Hypertension ?Currently improved blood pressure, had hypotension on presentation and required IV fluid bolus and amiodarone.  Overall pressures have improved with IV hydration ? ?Diabetes mellitus  type II ?Continue sliding scale insulin for now.  Overall controlled.   ? ?GERD ?Continue PPI. ? ?Hyperlipidemia ?Simvastatin has been changed to atorvastatin ?   ?Hypomagnesemia. ?Improved after replacement.   ?  ?Poor IV access.  Required PICC line, removed prior to discharge ?  ?Hypophosphatemia.  replaced ?  ?Deconditioning, debility PT, OT has seen the patient at this time.  From assisted living facility.  Will need home health services in assisted living facility on discharge ? ?Discharge Instructions ? ?Discharge Instructions   ? ? Diet - low sodium heart healthy   Complete by: As directed ?  ? Increase activity slowly   Complete by: As directed ?  ? No wound care   Complete by: As directed ?  ? ?  ? ?Allergies as of 11/09/2021   ?No Known Allergies ?  ? ?  ?Medication List  ?  ? ?STOP taking these medications   ? ?Cartia XT 240 MG 24 hr capsule ?Generic drug: diltiazem ?  ?furosemide 20 MG tablet ?Commonly known as: LASIX ?  ?potassium chloride 10 MEQ tablet ?Commonly known as: KLOR-CON M ?  ?simvastatin 20 MG tablet ?Commonly known as: ZOCOR ?  ?tamoxifen 20 MG tablet ?Commonly known as: NOLVADEX ?  ?warfarin 2.5 MG tablet ?Commonly known as: COUMADIN ?  ? ?  ? ?TAKE these medications   ? ?acetaminophen 325 MG tablet ?Commonly known as: TYLENOL ?Take 650 mg by mouth every 6 (six) hours as needed for mild pain or fever. ?  ?allopurinol 100 MG tablet ?Commonly known as: ZYLOPRIM ?Take 200 mg by mouth daily. ?  ?amiodarone 200 MG tablet ?Commonly known as: PACERONE ?Take 1 tablet (200 mg total) by mouth daily. ?Start taking on: November 10, 2021 ?  ?apixaban 5 MG Tabs tablet ?Commonly known as: ELIQUIS ?Take 1 tablet (5 mg total) by mouth 2 (two) times daily. ?  ?atorvastatin 10 MG tablet ?Commonly known as: LIPITOR ?Take 1 tablet (10 mg total) by mouth daily. ?Start taking on: November 10, 2021 ?  ?calcium carbonate 1250 (500 Ca) MG chewable tablet ?Commonly known as: OS-CAL ?Chew 1,250 mg by mouth daily. ?  ?DERMACLOUD EX ?Apply 1 application. topically in the morning, at noon, and at bedtime. To buttocks ?  ?levothyroxine 75 MCG tablet ?Commonly known as: SYNTHROID ?Take 75  mcg by mouth daily. ?  ?meclizine 12.5 MG tablet ?Commonly known as: ANTIVERT ?Take 12.5 mg by mouth 3 (three) times daily as needed for dizziness. ?  ?metoprolol succinate 25 MG 24 hr tablet ?Commonly known as: TOPROL-XL ?Take 3 tablets (75 mg total) by mouth 2 (two) times daily. ?  ?omeprazole 20 MG capsule ?Commonly known as: PRILOSEC ?Take 20 mg by mouth daily. ?  ?PROSTAT PO ?Take 30 mLs by mouth in the morning and at bedtime. ?  ? ?  ? ? Follow-up Information   ? ? Sherran Needs, NP Follow up on 11/20/2021.   ?Specialties: Nurse Practitioner, Cardiology ?Why: at 2:00pm with PA Adline Peals ? ?call for parking insturctions ?Contact information: ?[address] ?[place] [address] ?[phone] ? ? ?  ?  ? ? Elouise Munroe, MD Follow up on 05/09/2022.   ?Specialties: Cardiology, Radiology ?Why: at 3:20 AM ?Contact information: ?[address] ?[address] ?[place] [address] ?[phone] ? ? ?  ?  ? ?  ?  ? ?  ? ?No Known Allergies ? ?Consultations: ?cardiology ? ? ?Procedures/Studies: ?DG Chest 2 View ? ?Result Date: 10/31/2021 ?CLINICAL  DATA:  Atrial fibrillation, tachycardia EXAM: CHEST - 2 VIEW COMPARISON:  04/18/2016 FINDINGS: Transverse diameter of heart is increased. There is possible large fixed hiatal hernia. Lung fields are clear of any infiltrates or pulmonary edema. There is no significant pleural effusion or pneumothorax. Small linear densities in the right lower lung fields may suggest subsegmental atelectasis. Degenerative changes are noted in the left shoulder. IMPRESSION: Cardiomegaly. There are no signs of pulmonary edema or focal pulmonary consolidation. Linear densities in the right lower lung fields suggest subsegmental atelectasis. Possible large fixed hiatal hernia. Electronically Signed   By: Elmer Picker M.D.   On: 10/31/2021 14:53  ? ?DG CHEST PORT 1 VIEW ? ?Addendum Date: 11/03/2021   ?ADDENDUM REPORT: 11/03/2021 10:37 ADDENDUM: New PICC line placement. On the current exam  there is a right-sided PICC. Tip projects in the lower superior vena cava, well positioned. Electronically Signed   By: Lajean Manes M.D.   On: 11/03/2021 10:37  ? ?Result Date: 11/03/2021 ?CLINICAL DATA:  Tachy

## 2021-11-09 NOTE — Progress Notes (Signed)
Pt was discharged assisted by the [facility] team via stretcher. Pt was alert and oriented x4. Pt is in no distress.  ?

## 2021-11-09 NOTE — Care Management Important Message (Signed)
Important Message ? ?Patient Details  ?Name: Sara Merritt ?MRN: [id number] ?Date of Birth: 09/19/1929 ? ? ?Medicare Important Message Given:  Yes ? ? ? ? ?Shelda Altes ?11/09/2021, 9:01 AM ?

## 2021-11-09 NOTE — NC FL2 (Addendum)
?[place] MEDICAID FL2 LEVEL OF CARE SCREENING TOOL  ?  ? ?IDENTIFICATION  ?Patient Name: ?Sara Merritt Birthdate: 08-07-1930 Sex: female Admission Date (Current Location): ?10/31/2021  ?[place] and [place] Number: ? [place] ?  Facility and Address:  ?[facility], [place] [address] [place], [place] [address] ?     Provider Number: ?[id number]  ?Attending Physician Name and Address:  ?Kathie Dike, MD ? Relative Name and Phone Number:  ?  ?   ?Current Level of Care: ?Hospital Recommended Level of Care: ?Assisted Living Facility ([facility]) Prior Approval Number: ?  ? ?Date Approved/Denied: ?  PASRR Number: ?  ? ?Discharge Plan: ?Other (Comment) ([facility]) ?  ? ?Current Diagnoses: ?Patient Active Problem List  ? Diagnosis Date Noted  ? Acute deep vein thrombosis (DVT) of proximal vein of lower extremity (HCC)   ? Leg pain, diffuse, left 11/02/2021  ? Bacteremia 11/02/2021  ? Atrial fibrillation with rapid ventricular response ([place]) 10/31/2021  ? HTN (hypertension) 10/31/2021  ? Hypothyroidism 10/31/2021  ? Diabetes ([place]) 10/31/2021  ? GERD (gastroesophageal reflux disease) 10/31/2021  ? Hypokalemia 10/31/2021  ? 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 ? ?Orientation RESPIRATION BLADDER Height & Weight   ?  ?Self, Place,time,situation ? Normal Incontinent Weight: 176 lb 5.9 oz (80 kg) ?Height:  '5\' 4"'$  (162.6 cm)  ?BEHAVIORAL SYMPTOMS/MOOD NEUROLOGICAL BOWEL NUTRITION STATUS  ?    Incontinent No added salt   ?AMBULATORY STATUS COMMUNICATION OF NEEDS Skin   ?Extensive Assist Verbally Other (Comment) (Skin tear left [place]) ?  ?  ?  ?    ?     ?     ? ? ?Personal Care Assistance Level of Assistance  ?Bathing, Feeding, Dressing Bathing Assistance: Limited assistance ?Feeding assistance: Limited assistance ?Dressing Assistance: Limited assistance ?   ? ?Functional Limitations Info  ?Sight, Hearing, Speech Sight Info: Adequate ?Hearing Info:  Adequate ?Speech Info: Adequate  ? ? ?SPECIAL CARE FACTORS FREQUENCY  ?PT (By licensed PT), OT (By licensed OT)   ?  ?PT Frequency: 3x min weekly ?OT Frequency: 3x min weekly ?  ?  ?  ?   ? ? ?Contractures Contractures Info: Not present  ? ? ?Additional Factors Info  ?Code Status, Allergies, Insulin Sliding Scale Code Status Info: Full ?Allergies Info: NKA ?  ?Insulin Sliding Scale Info: insulin aspart (novoLOG) injection 0-9 Units 3 times daily with meals ?  ?   ? ? ?acetaminophen 325 MG tablet ?Commonly known as: TYLENOL ?Take 650 mg by mouth every 6 (six) hours as needed for mild pain or fever. ?   ?allopurinol 100 MG tablet ?Commonly known as: ZYLOPRIM ?Take 200 mg by mouth daily. ?   ?amiodarone 200 MG tablet ?Commonly known as: PACERONE ?Take 1 tablet (200 mg total) by mouth daily. ?Start taking on: November 10, 2021 ?   ?apixaban 5 MG Tabs tablet ?Commonly known as: ELIQUIS ?Take 1 tablet (5 mg total) by mouth 2 (two) times daily. ?   ?atorvastatin 10 MG tablet ?Commonly known as: LIPITOR ?Take 1 tablet (10 mg total) by mouth daily. ?Start taking on: November 10, 2021 ?   ?calcium carbonate 1250 (500 Ca) MG chewable tablet ?Commonly known as: OS-CAL ?Chew 1,250 mg by mouth daily. ?   ?DERMACLOUD EX ?Apply 1 application. topically in the morning, at noon, and at bedtime. To buttocks ?   ?levothyroxine 75 MCG tablet ?Commonly known as: SYNTHROID ?Take 75 mcg by mouth  daily. ?   ?meclizine 12.5 MG tablet ?Commonly known as: ANTIVERT ?Take 12.5 mg by mouth 3 (three) times daily as needed for dizziness. ?   ?metoprolol succinate 25 MG 24 hr tablet ?Commonly known as: TOPROL-XL ?Take 3 tablets (75 mg total) by mouth 2 (two) times daily. ?   ?omeprazole 20 MG capsule ?Commonly known as: PRILOSEC ?Take 20 mg by mouth daily. ?   ?PROSTAT PO ?Take 30 mLs by mouth in the morning and at bedtime. ?   ? ?Relevant Imaging Results: ? ?Relevant Lab Results: ? ? ?Additional Information ?SSN: [id number] ? ?Milas Gain,  LCSWA ? ? ? ? ?

## 2021-11-09 NOTE — Progress Notes (Signed)
? ?Progress Note ? ?Patient Name: Sara Merritt ?Date of Encounter: 11/09/2021 ? ?[facility] Cardiologist: Elouise Munroe, MD  ? ?Subjective  ? ?No chest pain or SOB.  BP improved to 104/66 today heart rate improved, 80s to 90s currently. ? ?Inpatient Medications  ?  ?Scheduled Meds: ? allopurinol  200 mg Oral Daily  ? amiodarone  200 mg Oral BID  ? apixaban  5 mg Oral BID  ? atorvastatin  10 mg Oral Daily  ? calcium carbonate  1,250 mg Oral Daily  ? Chlorhexidine Gluconate Cloth  6 each Topical Daily  ? feeding supplement  1 Container Oral TID BM  ? Gerhardt's butt cream   Topical TID  ? insulin aspart  0-9 Units Subcutaneous TID WC  ? levothyroxine  75 mcg Oral Daily  ? magnesium oxide  400 mg Oral BID  ? metoprolol succinate  75 mg Oral BID  ? pantoprazole  40 mg Oral Daily  ? sodium chloride flush  10-40 mL Intracatheter Q12H  ? ?Continuous Infusions: ? sodium chloride 100 mL/hr at 11/09/21 1610  ? ?PRN Meds: ?acetaminophen, ondansetron (ZOFRAN) IV, sodium chloride flush  ? ?Vital Signs  ?  ?Vitals:  ? 11/09/21 0100 11/09/21 0322 11/09/21 0800 11/09/21 0900  ?BP: (!) 95/45 (!) 100/54 (!) 100/58 104/66  ?Pulse: (!) 102 (!) 105 98 (!) 101  ?Resp: '20 20 20 20  '$ ?Temp:  98.2 ?F (36.8 ?C) 97.8 ?F (36.6 ?C) (!) 97.5 ?F (36.4 ?C)  ?TempSrc: Oral Oral Oral   ?SpO2: 92% 92% 96%   ?Weight:  80 kg    ?Height:      ? ? ?Intake/Output Summary (Last 24 hours) at 11/09/2021 1049 ?Last data filed at 11/08/2021 1500 ?Gross per 24 hour  ?Intake 295.17 ml  ?Output --  ?Net 295.17 ml  ? ? ? ?  11/09/2021  ?  3:22 AM 11/07/2021  ?  3:15 AM 11/06/2021  ?  3:55 AM  ?Last 3 Weights  ?Weight (lbs) 176 lb 5.9 oz 173 lb 11.6 oz 175 lb 11.3 oz  ?Weight (kg) 80 kg 78.8 kg 79.7 kg  ?   ? ?Telemetry  ?  ?Atrial fib rates 80s to 100s - Personally Reviewed ? ?ECG  ?  ?No new - Personally Reviewed ? ?Physical Exam  ? ?GEN: No acute distress.   ?Neck: No JVD ?Cardiac: irreg irreg, no murmurs, rubs, or gallops.  ?Respiratory: Clear to  auscultation bilaterally. ?GI: Soft, nontender, non-distended  ?MS: No edema; No deformity. ?Neuro:  Nonfocal  ?Psych: Normal affect  ? ?Labs  ?  ?High Sensitivity Troponin:  No results for input(s): TROPONINIHS in the last 720 hours.   ?Chemistry ?Recent Labs  ?Lab 11/04/21 ?9604 11/05/21 ?5409 11/06/21 ?8119 11/07/21 ?1478 11/08/21 ?2956 11/09/21 ?2130  ?NA 136   < > 138 139 138 139  ?K 3.7   < > 4.0 4.1 4.2 3.5  ?CL 107   < > 109 108 106 107  ?CO2 24   < > '25 26 25 25  '$ ?GLUCOSE 189*   < > 109* 106* 102* 99  ?BUN 5*   < > <5* 7* 6* 6*  ?CREATININE 0.65   < > 0.58 0.65 0.64 0.57  ?CALCIUM 7.9*   < > 8.1* 8.2* 8.1* 7.6*  ?MG 1.7  --  1.8 2.1  --  1.7  ?PROT 4.3*  --   --   --   --   --   ?ALBUMIN 1.8*  --   --   --   --   --   ?  AST 21  --   --   --   --   --   ?ALT 7  --   --   --   --   --   ?ALKPHOS 49  --   --   --   --   --   ?BILITOT 0.4  --   --   --   --   --   ?GFRNONAA >60   < > >60 >60 >60 >60  ?ANIONGAP 5   < > 4* '5 7 7  '$ ? < > = values in this interval not displayed.  ? ?  ?Lipids No results for input(s): CHOL, TRIG, HDL, LABVLDL, LDLCALC, CHOLHDL in the last 168 hours.  ?Hematology ?Recent Labs  ?Lab 11/06/21 ?0356 11/07/21 ?0325 11/09/21 ?0436  ?WBC 5.5 6.2 6.3  ?RBC 3.16* 3.21* 2.85*  ?HGB 11.2* 11.2* 10.2*  ?HCT 33.5* 34.3* 30.2*  ?MCV 106.0* 106.9* 106.0*  ?MCH 35.4* 34.9* 35.8*  ?MCHC 33.4 32.7 33.8  ?RDW 14.3 14.4 14.2  ?PLT 140* 152 154  ? ? ?Thyroid  ?Recent Labs  ?Lab 11/03/21 ?0751  ?TSH 6.793*  ?FREET4 1.35*  ? ?  ?BNPNo results for input(s): BNP, PROBNP in the last 168 hours.  ?DDimer No results for input(s): DDIMER in the last 168 hours.  ? ?Radiology  ?  ?No results found. ? ?Cardiac Studies  ? ? ?TTE 11/01/21 ?IMPRESSIONS  ? ? ? 1. Left ventricular ejection fraction, by estimation, is 55 to 60%. The  ?left ventricle has normal function. The left ventricle has no regional  ?wall motion abnormalities. Left ventricular diastolic parameters are  ?indeterminate. Elevated left ventricular   ?end-diastolic pressure.  ? 2. Right ventricular systolic function is normal. The right ventricular  ?size is moderately enlarged. There is severely elevated pulmonary artery  ?systolic pressure.  ? 3. Left atrial size was severely dilated.  ? 4. Right atrial size was severely dilated.  ? 5. The mitral valve is normal in structure. Trivial mitral valve  ?regurgitation. No evidence of mitral stenosis. Moderate mitral annular  ?calcification.  ? 6. Tricuspid valve regurgitation is moderate.  ? 7. The aortic valve is calcified. There is moderate calcification of the  ?aortic valve. There is moderate thickening of the aortic valve. Aortic  ?valve regurgitation is trivial. Moderate aortic valve stenosis.  ? 8. The inferior vena cava is normal in size with greater than 50%  ?respiratory variability, suggesting right atrial pressure of 3 mmHg.  ? ?FINDINGS  ? Left Ventricle: Left ventricular ejection fraction, by estimation, is 55  ?to 60%. The left ventricle has normal function. The left ventricle has no  ?regional wall motion abnormalities. The left ventricular internal cavity  ?size was normal in size. There is  ? no left ventricular hypertrophy. Left ventricular diastolic function  ?could not be evaluated due to atrial fibrillation. Left ventricular  ?diastolic parameters are indeterminate. Elevated left ventricular  ?end-diastolic pressure.  ? ?Right Ventricle: The right ventricular size is moderately enlarged. No  ?increase in right ventricular wall thickness. Right ventricular systolic  ?function is normal. There is severely elevated pulmonary artery systolic  ?pressure. The tricuspid regurgitant  ?velocity is 4.61 m/s, and with an assumed right atrial pressure of 3 mmHg,  ?the estimated right ventricular systolic pressure is 28.3 mmHg.  ? ?Left Atrium: Left atrial size was severely dilated.  ? ?Right Atrium: Right atrial size was severely dilated.  ? ?Pericardium: There is no evidence of pericardial effusion.   ? ?  Mitral Valve: The mitral valve is normal in structure. Moderate mitral  ?annular calcification. Trivial mitral valve regurgitation. No evidence of  ?mitral valve stenosis.  ? ?Tricuspid Valve: The tricuspid valve is normal in structure. Tricuspid  ?valve regurgitation is moderate . No evidence of tricuspid stenosis.  ? ?Aortic Valve: The aortic valve is calcified. There is moderate  ?calcification of the aortic valve. There is moderate thickening of the  ?aortic valve. Aortic valve regurgitation is trivial. Aortic regurgitation  ?PHT measures 388 msec. Moderate aortic  ?stenosis is present. Aortic valve mean gradient measures 16.5 mmHg. Aortic  ?valve peak gradient measures 26.7 mmHg. Aortic valve area, by VTI measures  ?0.95 cm?.  ? ?Pulmonic Valve: The pulmonic valve was normal in structure. Pulmonic valve  ?regurgitation is not visualized. No evidence of pulmonic stenosis.  ? ?Aorta: The aortic root is normal in size and structure.  ? ?Venous: The inferior vena cava is normal in size with greater than 50%  ?respiratory variability, suggesting right atrial pressure of 3 mmHg.  ? ?IAS/Shunts: No atrial level shunt detected by color flow Doppler.  ?Patient Profile  ?   ?88 y.o. female with a history of persistent/permanent atrial fibrillation on Coumadin, hypertension, pulmonary hypertension, hyperlipidemia, type 2 diabetes mellitus, CKD stage II-III,  hypothyroidism, and breast cancer s/p lumpectomy now with atrial fib.  ?  ? ?Assessment & Plan  ? Persistent/ Permanent Atrial Fibrillation ?Patient reportedly has had atrial fibrillation since the 1950s, in her 59s perhaps. Unclear if atrial fibrillation is permanent at this point. Patient now presents with rapid atrial fibrillation with rates reportedly in the 200s and was hypotensive in the field. Started on IV Amiodarone. Echo shows normal LV function. ?-Maintain K greater than 4, mag greater than 2 ?-TSH elevated ?- BP appears improved, added metoprolol ?-  Given advanced age and concomitant use of amiodarone, discontinued digoxin due to about potential toxicities.   ?- CHA2DS-VASc = 5 (HTN, DM, age x2, female).  Has been on Coumadin for anticoagulation, switched to E

## 2021-11-13 DIAGNOSIS — I5032 Chronic diastolic (congestive) heart failure: Secondary | ICD-10-CM | POA: Diagnosis not present

## 2021-11-13 DIAGNOSIS — C50912 Malignant neoplasm of unspecified site of left female breast: Secondary | ICD-10-CM | POA: Diagnosis not present

## 2021-11-13 DIAGNOSIS — R42 Dizziness and giddiness: Secondary | ICD-10-CM | POA: Diagnosis not present

## 2021-11-13 DIAGNOSIS — S2242XD Multiple fractures of ribs, left side, subsequent encounter for fracture with routine healing: Secondary | ICD-10-CM | POA: Diagnosis not present

## 2021-11-13 DIAGNOSIS — Z7901 Long term (current) use of anticoagulants: Secondary | ICD-10-CM | POA: Diagnosis not present

## 2021-11-13 DIAGNOSIS — E785 Hyperlipidemia, unspecified: Secondary | ICD-10-CM | POA: Diagnosis not present

## 2021-11-13 DIAGNOSIS — Z8781 Personal history of (healed) traumatic fracture: Secondary | ICD-10-CM | POA: Diagnosis not present

## 2021-11-13 DIAGNOSIS — I482 Chronic atrial fibrillation, unspecified: Secondary | ICD-10-CM | POA: Diagnosis not present

## 2021-11-13 DIAGNOSIS — Z923 Personal history of irradiation: Secondary | ICD-10-CM | POA: Diagnosis not present

## 2021-11-13 DIAGNOSIS — C50911 Malignant neoplasm of unspecified site of right female breast: Secondary | ICD-10-CM | POA: Diagnosis not present

## 2021-11-13 DIAGNOSIS — K219 Gastro-esophageal reflux disease without esophagitis: Secondary | ICD-10-CM | POA: Diagnosis not present

## 2021-11-13 DIAGNOSIS — E039 Hypothyroidism, unspecified: Secondary | ICD-10-CM | POA: Diagnosis not present

## 2021-11-13 DIAGNOSIS — K449 Diaphragmatic hernia without obstruction or gangrene: Secondary | ICD-10-CM | POA: Diagnosis not present

## 2021-11-13 DIAGNOSIS — I951 Orthostatic hypotension: Secondary | ICD-10-CM | POA: Diagnosis not present

## 2021-11-13 DIAGNOSIS — I110 Hypertensive heart disease with heart failure: Secondary | ICD-10-CM | POA: Diagnosis not present

## 2021-11-13 DIAGNOSIS — E8809 Other disorders of plasma-protein metabolism, not elsewhere classified: Secondary | ICD-10-CM | POA: Diagnosis not present

## 2021-11-13 DIAGNOSIS — Z48815 Encounter for surgical aftercare following surgery on the digestive system: Secondary | ICD-10-CM | POA: Diagnosis not present

## 2021-11-13 DIAGNOSIS — M109 Gout, unspecified: Secondary | ICD-10-CM | POA: Diagnosis not present

## 2021-11-13 DIAGNOSIS — K5730 Diverticulosis of large intestine without perforation or abscess without bleeding: Secondary | ICD-10-CM | POA: Diagnosis not present

## 2021-11-13 DIAGNOSIS — E778 Other disorders of glycoprotein metabolism: Secondary | ICD-10-CM | POA: Diagnosis not present

## 2021-11-13 DIAGNOSIS — Z9181 History of falling: Secondary | ICD-10-CM | POA: Diagnosis not present

## 2021-11-13 DIAGNOSIS — I2510 Atherosclerotic heart disease of native coronary artery without angina pectoris: Secondary | ICD-10-CM | POA: Diagnosis not present

## 2021-11-13 DIAGNOSIS — M1990 Unspecified osteoarthritis, unspecified site: Secondary | ICD-10-CM | POA: Diagnosis not present

## 2021-11-14 DIAGNOSIS — H814 Vertigo of central origin: Secondary | ICD-10-CM | POA: Diagnosis not present

## 2021-11-14 DIAGNOSIS — I5033 Acute on chronic diastolic (congestive) heart failure: Secondary | ICD-10-CM | POA: Diagnosis not present

## 2021-11-14 DIAGNOSIS — E782 Mixed hyperlipidemia: Secondary | ICD-10-CM | POA: Diagnosis not present

## 2021-11-14 DIAGNOSIS — M10472 Other secondary gout, left ankle and foot: Secondary | ICD-10-CM | POA: Diagnosis not present

## 2021-11-14 DIAGNOSIS — R627 Adult failure to thrive: Secondary | ICD-10-CM | POA: Diagnosis not present

## 2021-11-14 DIAGNOSIS — K219 Gastro-esophageal reflux disease without esophagitis: Secondary | ICD-10-CM | POA: Diagnosis not present

## 2021-11-14 DIAGNOSIS — F02B0 Dementia in other diseases classified elsewhere, moderate, without behavioral disturbance, psychotic disturbance, mood disturbance, and anxiety: Secondary | ICD-10-CM | POA: Diagnosis not present

## 2021-11-14 DIAGNOSIS — I4821 Permanent atrial fibrillation: Secondary | ICD-10-CM | POA: Diagnosis not present

## 2021-11-15 ENCOUNTER — Encounter: Payer: Self-pay | Admitting: Oncology

## 2021-11-15 DIAGNOSIS — Z853 Personal history of malignant neoplasm of breast: Secondary | ICD-10-CM | POA: Diagnosis not present

## 2021-11-16 DIAGNOSIS — I5032 Chronic diastolic (congestive) heart failure: Secondary | ICD-10-CM | POA: Diagnosis not present

## 2021-11-16 DIAGNOSIS — C50912 Malignant neoplasm of unspecified site of left female breast: Secondary | ICD-10-CM | POA: Diagnosis not present

## 2021-11-16 DIAGNOSIS — Z9181 History of falling: Secondary | ICD-10-CM | POA: Diagnosis not present

## 2021-11-16 DIAGNOSIS — Z7901 Long term (current) use of anticoagulants: Secondary | ICD-10-CM | POA: Diagnosis not present

## 2021-11-16 DIAGNOSIS — I2510 Atherosclerotic heart disease of native coronary artery without angina pectoris: Secondary | ICD-10-CM | POA: Diagnosis not present

## 2021-11-16 DIAGNOSIS — S2242XD Multiple fractures of ribs, left side, subsequent encounter for fracture with routine healing: Secondary | ICD-10-CM | POA: Diagnosis not present

## 2021-11-16 DIAGNOSIS — Z923 Personal history of irradiation: Secondary | ICD-10-CM | POA: Diagnosis not present

## 2021-11-16 DIAGNOSIS — Z48815 Encounter for surgical aftercare following surgery on the digestive system: Secondary | ICD-10-CM | POA: Diagnosis not present

## 2021-11-16 DIAGNOSIS — I951 Orthostatic hypotension: Secondary | ICD-10-CM | POA: Diagnosis not present

## 2021-11-16 DIAGNOSIS — E039 Hypothyroidism, unspecified: Secondary | ICD-10-CM | POA: Diagnosis not present

## 2021-11-16 DIAGNOSIS — E785 Hyperlipidemia, unspecified: Secondary | ICD-10-CM | POA: Diagnosis not present

## 2021-11-16 DIAGNOSIS — K5730 Diverticulosis of large intestine without perforation or abscess without bleeding: Secondary | ICD-10-CM | POA: Diagnosis not present

## 2021-11-16 DIAGNOSIS — K449 Diaphragmatic hernia without obstruction or gangrene: Secondary | ICD-10-CM | POA: Diagnosis not present

## 2021-11-16 DIAGNOSIS — M109 Gout, unspecified: Secondary | ICD-10-CM | POA: Diagnosis not present

## 2021-11-16 DIAGNOSIS — I482 Chronic atrial fibrillation, unspecified: Secondary | ICD-10-CM | POA: Diagnosis not present

## 2021-11-16 DIAGNOSIS — I110 Hypertensive heart disease with heart failure: Secondary | ICD-10-CM | POA: Diagnosis not present

## 2021-11-16 DIAGNOSIS — R42 Dizziness and giddiness: Secondary | ICD-10-CM | POA: Diagnosis not present

## 2021-11-16 DIAGNOSIS — Z8781 Personal history of (healed) traumatic fracture: Secondary | ICD-10-CM | POA: Diagnosis not present

## 2021-11-16 DIAGNOSIS — M1990 Unspecified osteoarthritis, unspecified site: Secondary | ICD-10-CM | POA: Diagnosis not present

## 2021-11-16 DIAGNOSIS — C50911 Malignant neoplasm of unspecified site of right female breast: Secondary | ICD-10-CM | POA: Diagnosis not present

## 2021-11-16 DIAGNOSIS — K219 Gastro-esophageal reflux disease without esophagitis: Secondary | ICD-10-CM | POA: Diagnosis not present

## 2021-11-16 DIAGNOSIS — E778 Other disorders of glycoprotein metabolism: Secondary | ICD-10-CM | POA: Diagnosis not present

## 2021-11-16 DIAGNOSIS — E8809 Other disorders of plasma-protein metabolism, not elsewhere classified: Secondary | ICD-10-CM | POA: Diagnosis not present

## 2021-11-19 ENCOUNTER — Telehealth: Payer: Self-pay

## 2021-11-19 NOTE — Telephone Encounter (Signed)
Daughter called to let us know that the tamoxifen has been stopped due to endometrial wall thickening.  She has also been moved to [facility] where palliative care is being consulted. ? ?Dr. Bobby Rumpf aware. ?

## 2021-11-20 ENCOUNTER — Encounter (HOSPITAL_COMMUNITY): Payer: Self-pay | Admitting: Physician Assistant

## 2021-11-20 ENCOUNTER — Ambulatory Visit (HOSPITAL_COMMUNITY)
Admit: 2021-11-20 | Discharge: 2021-11-20 | Disposition: A | Discharge status: Home / Self Care | Payer: Medicare Other | Attending: Physician Assistant | Admitting: Physician Assistant

## 2021-11-20 VITALS — BP 112/98 | HR 163 | Ht 64.0 in | Wt 176.4 lb

## 2021-11-20 DIAGNOSIS — E119 Type 2 diabetes mellitus without complications: Secondary | ICD-10-CM | POA: Diagnosis not present

## 2021-11-20 DIAGNOSIS — Z7901 Long term (current) use of anticoagulants: Secondary | ICD-10-CM | POA: Diagnosis not present

## 2021-11-20 DIAGNOSIS — Z79899 Other long term (current) drug therapy: Secondary | ICD-10-CM | POA: Insufficient documentation

## 2021-11-20 DIAGNOSIS — I4821 Permanent atrial fibrillation: Secondary | ICD-10-CM | POA: Diagnosis not present

## 2021-11-20 DIAGNOSIS — D6869 Other thrombophilia: Secondary | ICD-10-CM | POA: Diagnosis not present

## 2021-11-20 DIAGNOSIS — N189 Chronic kidney disease, unspecified: Secondary | ICD-10-CM | POA: Insufficient documentation

## 2021-11-20 DIAGNOSIS — I129 Hypertensive chronic kidney disease with stage 1 through stage 4 chronic kidney disease, or unspecified chronic kidney disease: Secondary | ICD-10-CM | POA: Insufficient documentation

## 2021-11-20 DIAGNOSIS — Z853 Personal history of malignant neoplasm of breast: Secondary | ICD-10-CM | POA: Diagnosis not present

## 2021-11-20 DIAGNOSIS — E039 Hypothyroidism, unspecified: Secondary | ICD-10-CM | POA: Diagnosis not present

## 2021-11-20 MED ORDER — AMIODARONE HCL 200 MG PO TABS: 200.0000 mg | ORAL_TABLET | Freq: Two times a day (BID) | ORAL | 0 refills | Status: DC

## 2021-11-20 NOTE — Patient Instructions (Signed)
Increase amiodarone to 200mg twice a day 

## 2021-11-20 NOTE — Progress Notes (Signed)
? ? ?Primary Care Physician: Marco Collie, MD ?Primary Cardiologist: Dr Margaretann Loveless  ?Primary Electrophysiologist: none ?Referring Physician: Dr Gardiner Rhyme  ? ? ?Sara Merritt is a 88 y.o. female with a history of breast cancer status post lumpectomy, hypertension, CKD, atrial fibrillation, hypothyroidism, diabetes who presents for consultation in the [facility].  The patient was initially diagnosed with atrial fibrillation in 1950 per patient report. Patient felt unwell for a week and was seen for UA. She was noted to be dizzy and disoriented with standing. EKG showed afib RVR. Initial rates 200 bpm with hypotension. She was sent to the ED for evaluation. Patient also recently had cholecystectomy with antibiotics. She was loaded on amiodarone and metoprolol for rate control. It is unclear if she is in permanent afib. Patient is on Eliquis for a CHADS2VASC score of 5.  ? ?Today, patient and her daughter report that her heart rates have been overall controlled at home (80s-110s bpm). Her heart rates do jump up with minimal exertion. She does get lightheaded when her heart rates become very elevated. She is working with PT to try and get her legs stronger. Per her daughter, palliative care is being consulted. She is not eating or drinking very much.  ? ?Today, she denies symptoms of palpitations, chest pain, shortness of breath, orthopnea, PND, lower extremity edema, presyncope, syncope, snoring, daytime somnolence, bleeding, or neurologic sequela. The patient is tolerating medications without difficulties and is otherwise without complaint today.  ? ? ?Atrial Fibrillation Risk Factors: ? ?she does not have symptoms or diagnosis of sleep apnea. ?she does not have a history of rheumatic fever. ? ? ?she has a BMI of Body mass index is 30.27 kg/m?Marland KitchenMarland Kitchen ?Filed Weights  ? 11/20/21 1359  ?Weight: 80 kg  ? ? ?Family History  ?Problem Relation Age of Onset  ? Stroke Mother   ? Heart disease  Father   ? ? ? ?Atrial Fibrillation Management history: ? ?Previous antiarrhythmic drugs: amiodarone  ?Previous cardioversions: none ?Previous ablations: none ?CHADS2VASC score: 5 ?Anticoagulation history: Eliquis ? ? ?Past Medical History:  ?Diagnosis Date  ? Atrial fibrillation ([place])   ? ?Past Surgical History:  ?Procedure Laterality Date  ? CHOLECYSTECTOMY N/A 2022  ? ? ?Current Outpatient Medications  ?Medication Sig Dispense Refill  ? acetaminophen (TYLENOL) 325 MG tablet Take 650 mg by mouth every 6 (six) hours as needed for mild pain or fever.    ? allopurinol (ZYLOPRIM) 100 MG tablet Take 200 mg by mouth daily.    ? apixaban (ELIQUIS) 5 MG TABS tablet Take 1 tablet (5 mg total) by mouth 2 (two) times daily. 60 tablet 0  ? atorvastatin (LIPITOR) 10 MG tablet Take 1 tablet (10 mg total) by mouth daily. 30 tablet 0  ? calcium carbonate (OS-CAL) 1250 (500 Ca) MG chewable tablet Chew 1,250 mg by mouth daily.    ? Infant Care Products [facility] EX) Apply 1 application. topically in the morning, at noon, and at bedtime. To buttocks    ? levothyroxine (SYNTHROID) 75 MCG tablet Take 75 mcg by mouth daily.    ? meclizine (ANTIVERT) 12.5 MG tablet Take 12.5 mg by mouth 3 (three) times daily as needed for dizziness.    ? metoprolol succinate (TOPROL-XL) 25 MG 24 hr tablet Take 3 tablets (75 mg total) by mouth 2 (two) times daily. 180 tablet 0  ? omeprazole (PRILOSEC) 20 MG capsule Take 20 mg by mouth daily.    ? Pollen Extracts (PROSTAT PO) Take  30 mLs by mouth in the morning and at bedtime.    ? amiodarone (PACERONE) 200 MG tablet Take 1 tablet (200 mg total) by mouth 2 (two) times daily. 30 tablet 0  ? ?No current facility-administered medications for this encounter.  ? ? ?No Known Allergies ? ?Social History  ? ?Socioeconomic History  ? Marital status: Widowed  ?  Spouse name: Not on file  ? Number of children: Not on file  ? Years of education: Not on file  ? Highest education level: Not on file  ?Occupational  History  ? Not on file  ?Tobacco Use  ? Smoking status: Former  ? Smokeless tobacco: Never  ? Tobacco comments:  ?  Former smoker 11/20/21  ?Substance and Sexual Activity  ? Alcohol use: Never  ? Drug use: Never  ? Sexual activity: Not on file  ?Other Topics Concern  ? Not on file  ?Social History Narrative  ? Not on file  ? ?Social Determinants of Health  ? ?Financial Resource Strain: Not on file  ?Food Insecurity: Not on file  ?Transportation Needs: Not on file  ?Physical Activity: Not on file  ?Stress: Not on file  ?Social Connections: Not on file  ?Intimate Partner Violence: Not on file  ? ? ? ?ROS- All systems are reviewed and negative except as per the HPI above. ? ?Physical Exam: ?Vitals:  ? 11/20/21 1359  ?BP: (!) 112/98  ?Pulse: (!) 163  ?Weight: 80 kg  ?Height: '5\' 4"'$  (1.626 m)  ? ? ?GEN- The patient is a well appearing elderly female, alert and oriented x 3 today.   ?Head- normocephalic, atraumatic ?Eyes-  Sclera clear, conjunctiva pink ?Ears- hearing intact ?Oropharynx- clear ?Neck- supple  ?Lungs- Clear to ausculation bilaterally, normal work of breathing ?Heart- irregular rate and rhythm, no murmurs, rubs or gallops  ?GI- soft, NT, ND, + BS ?Extremities- no clubbing, cyanosis, or edema ?MS- no significant deformity or atrophy ?Skin- no rash or lesion ?Psych- euthymic mood, full affect ?Neuro- strength and sensation are intact ? ?Wt Readings from Last 3 Encounters:  ?11/20/21 80 kg  ?11/09/21 80 kg  ?08/14/21 85.2 kg  ? ? ?EKG today demonstrates  ?Afib with RVR ?Vent. rate 163 BPM ?PR interval * ms ?QRS duration 74 ms ?QT/QTcB 296/487 ms ? ?Echo 11/01/21 demonstrated  ? 1. Left ventricular ejection fraction, by estimation, is 55 to 60%. The  ?left ventricle has normal function. The left ventricle has no regional  ?wall motion abnormalities. Left ventricular diastolic parameters are  ?indeterminate. Elevated left ventricular end-diastolic pressure.  ? 2. Right ventricular systolic function is normal. The  right ventricular  ?size is moderately enlarged. There is severely elevated pulmonary artery systolic pressure.  ? 3. Left atrial size was severely dilated.  ? 4. Right atrial size was severely dilated.  ? 5. The mitral valve is normal in structure. Trivial mitral valve  ?regurgitation. No evidence of mitral stenosis. Moderate mitral annular  ?calcification.  ? 6. Tricuspid valve regurgitation is moderate.  ? 7. The aortic valve is calcified. There is moderate calcification of the  ?aortic valve. There is moderate thickening of the aortic valve. Aortic  ?valve regurgitation is trivial. Moderate aortic valve stenosis.  ? 8. The inferior vena cava is normal in size with greater than 50%  ?respiratory variability, suggesting right atrial pressure of 3 mmHg.  ? ?Epic records are reviewed at length today ? ?CHA2DS2-VASc Score = 5  ?The patient's score is based upon: ?CHF History: 0 ?  HTN History: 1 ?Diabetes History: 1 ?Stroke History: 0 ?Vascular Disease History: 0 ?Age Score: 2 ?Gender Score: 1 ?    ? ? ?ASSESSMENT AND PLAN: ?1. Permanent Atrial Fibrillation (ICD10:  I48.11) ?The patient's CHA2DS2-VASc score is 5, indicating a 7.2% annual risk of stroke.   ?Her heart rates were initially rapid at her visit today but were better on recheck in the 80s-90s.  ?Will increase amiodarone to 200 mg BID for two weeks to try and load this medication faster.  ?Continue Toprol 75 mg BID. BP limits up titration.  ?Continue Eliquis 5 mg BID ? ?2. Secondary Hypercoagulable State (ICD10:  D68.69) ?The patient is at significant risk for stroke/thromboembolism based upon her CHA2DS2-VASc Score of 5.  Continue Apixaban (Eliquis).  ? ?3. HTN ?Stable, no changes today. ?Has had issues with hypotension on higher doses of rate control.  ? ? ?Follow up in the AF clinic in 2 weeks to reassess rate control.  ? ? ?Ricky Rokia Bosket PA-C ?[facility] ?[facility] ?[address] ?[place], [place] [address] ?[phone] ?11/20/2021 ?3:36 PM ? ?

## 2021-11-21 DIAGNOSIS — Z923 Personal history of irradiation: Secondary | ICD-10-CM | POA: Diagnosis not present

## 2021-11-21 DIAGNOSIS — C50911 Malignant neoplasm of unspecified site of right female breast: Secondary | ICD-10-CM | POA: Diagnosis not present

## 2021-11-21 DIAGNOSIS — I110 Hypertensive heart disease with heart failure: Secondary | ICD-10-CM | POA: Diagnosis not present

## 2021-11-21 DIAGNOSIS — Z9181 History of falling: Secondary | ICD-10-CM | POA: Diagnosis not present

## 2021-11-21 DIAGNOSIS — E778 Other disorders of glycoprotein metabolism: Secondary | ICD-10-CM | POA: Diagnosis not present

## 2021-11-21 DIAGNOSIS — I951 Orthostatic hypotension: Secondary | ICD-10-CM | POA: Diagnosis not present

## 2021-11-21 DIAGNOSIS — K449 Diaphragmatic hernia without obstruction or gangrene: Secondary | ICD-10-CM | POA: Diagnosis not present

## 2021-11-21 DIAGNOSIS — R42 Dizziness and giddiness: Secondary | ICD-10-CM | POA: Diagnosis not present

## 2021-11-21 DIAGNOSIS — I2510 Atherosclerotic heart disease of native coronary artery without angina pectoris: Secondary | ICD-10-CM | POA: Diagnosis not present

## 2021-11-21 DIAGNOSIS — E785 Hyperlipidemia, unspecified: Secondary | ICD-10-CM | POA: Diagnosis not present

## 2021-11-21 DIAGNOSIS — K219 Gastro-esophageal reflux disease without esophagitis: Secondary | ICD-10-CM | POA: Diagnosis not present

## 2021-11-21 DIAGNOSIS — S2242XD Multiple fractures of ribs, left side, subsequent encounter for fracture with routine healing: Secondary | ICD-10-CM | POA: Diagnosis not present

## 2021-11-21 DIAGNOSIS — M109 Gout, unspecified: Secondary | ICD-10-CM | POA: Diagnosis not present

## 2021-11-21 DIAGNOSIS — Z7901 Long term (current) use of anticoagulants: Secondary | ICD-10-CM | POA: Diagnosis not present

## 2021-11-21 DIAGNOSIS — M1990 Unspecified osteoarthritis, unspecified site: Secondary | ICD-10-CM | POA: Diagnosis not present

## 2021-11-21 DIAGNOSIS — Z48815 Encounter for surgical aftercare following surgery on the digestive system: Secondary | ICD-10-CM | POA: Diagnosis not present

## 2021-11-21 DIAGNOSIS — I5032 Chronic diastolic (congestive) heart failure: Secondary | ICD-10-CM | POA: Diagnosis not present

## 2021-11-21 DIAGNOSIS — G301 Alzheimer's disease with late onset: Secondary | ICD-10-CM | POA: Diagnosis not present

## 2021-11-21 DIAGNOSIS — E8809 Other disorders of plasma-protein metabolism, not elsewhere classified: Secondary | ICD-10-CM | POA: Diagnosis not present

## 2021-11-21 DIAGNOSIS — I4821 Permanent atrial fibrillation: Secondary | ICD-10-CM | POA: Diagnosis not present

## 2021-11-21 DIAGNOSIS — I482 Chronic atrial fibrillation, unspecified: Secondary | ICD-10-CM | POA: Diagnosis not present

## 2021-11-21 DIAGNOSIS — F02B0 Dementia in other diseases classified elsewhere, moderate, without behavioral disturbance, psychotic disturbance, mood disturbance, and anxiety: Secondary | ICD-10-CM | POA: Diagnosis not present

## 2021-11-21 DIAGNOSIS — C50912 Malignant neoplasm of unspecified site of left female breast: Secondary | ICD-10-CM | POA: Diagnosis not present

## 2021-11-21 DIAGNOSIS — Z8781 Personal history of (healed) traumatic fracture: Secondary | ICD-10-CM | POA: Diagnosis not present

## 2021-11-21 DIAGNOSIS — E039 Hypothyroidism, unspecified: Secondary | ICD-10-CM | POA: Diagnosis not present

## 2021-11-21 DIAGNOSIS — K5730 Diverticulosis of large intestine without perforation or abscess without bleeding: Secondary | ICD-10-CM | POA: Diagnosis not present

## 2021-11-21 DIAGNOSIS — I5033 Acute on chronic diastolic (congestive) heart failure: Secondary | ICD-10-CM | POA: Diagnosis not present

## 2021-11-26 ENCOUNTER — Telehealth (HOSPITAL_COMMUNITY): Payer: Self-pay | Admitting: *Deleted

## 2021-11-26 MED ORDER — AMIODARONE HCL 200 MG PO TABS: 200.0000 mg | ORAL_TABLET | Freq: Every day | ORAL | 0 refills | Status: AC

## 2021-11-26 NOTE — Telephone Encounter (Signed)
Spoke with patient's daughter - states patient is transitioning to hospice care. Per Adline Peals PA will reduce amiodarone back to '200mg'$  once a day and follow under the care of the hospice physicians. Pt does not need to return to clinic for follow up. ?

## 2021-11-28 DIAGNOSIS — G301 Alzheimer's disease with late onset: Secondary | ICD-10-CM | POA: Diagnosis not present

## 2021-11-28 DIAGNOSIS — Z7901 Long term (current) use of anticoagulants: Secondary | ICD-10-CM | POA: Diagnosis not present

## 2021-11-28 DIAGNOSIS — K5901 Slow transit constipation: Secondary | ICD-10-CM | POA: Diagnosis not present

## 2021-11-28 DIAGNOSIS — F02B0 Dementia in other diseases classified elsewhere, moderate, without behavioral disturbance, psychotic disturbance, mood disturbance, and anxiety: Secondary | ICD-10-CM | POA: Diagnosis not present

## 2021-11-28 DIAGNOSIS — I4821 Permanent atrial fibrillation: Secondary | ICD-10-CM | POA: Diagnosis not present

## 2021-11-28 DIAGNOSIS — I5033 Acute on chronic diastolic (congestive) heart failure: Secondary | ICD-10-CM | POA: Diagnosis not present

## 2021-12-04 ENCOUNTER — Ambulatory Visit (HOSPITAL_COMMUNITY): Payer: Medicare Other | Admitting: Physician Assistant

## 2021-12-05 DIAGNOSIS — F02B0 Dementia in other diseases classified elsewhere, moderate, without behavioral disturbance, psychotic disturbance, mood disturbance, and anxiety: Secondary | ICD-10-CM | POA: Diagnosis not present

## 2021-12-05 DIAGNOSIS — G301 Alzheimer's disease with late onset: Secondary | ICD-10-CM | POA: Diagnosis not present

## 2021-12-12 DIAGNOSIS — R278 Other lack of coordination: Secondary | ICD-10-CM | POA: Diagnosis not present

## 2021-12-12 DIAGNOSIS — I5032 Chronic diastolic (congestive) heart failure: Secondary | ICD-10-CM | POA: Diagnosis not present

## 2021-12-12 DIAGNOSIS — B379 Candidiasis, unspecified: Secondary | ICD-10-CM | POA: Diagnosis not present

## 2021-12-12 DIAGNOSIS — R197 Diarrhea, unspecified: Secondary | ICD-10-CM | POA: Diagnosis not present

## 2021-12-12 DIAGNOSIS — M6281 Muscle weakness (generalized): Secondary | ICD-10-CM | POA: Diagnosis not present

## 2021-12-12 DIAGNOSIS — N189 Chronic kidney disease, unspecified: Secondary | ICD-10-CM | POA: Diagnosis not present

## 2021-12-12 DIAGNOSIS — R293 Abnormal posture: Secondary | ICD-10-CM | POA: Diagnosis not present

## 2021-12-13 DIAGNOSIS — R293 Abnormal posture: Secondary | ICD-10-CM | POA: Diagnosis not present

## 2021-12-13 DIAGNOSIS — M6281 Muscle weakness (generalized): Secondary | ICD-10-CM | POA: Diagnosis not present

## 2021-12-13 DIAGNOSIS — I5032 Chronic diastolic (congestive) heart failure: Secondary | ICD-10-CM | POA: Diagnosis not present

## 2021-12-13 DIAGNOSIS — R278 Other lack of coordination: Secondary | ICD-10-CM | POA: Diagnosis not present

## 2021-12-13 DIAGNOSIS — N189 Chronic kidney disease, unspecified: Secondary | ICD-10-CM | POA: Diagnosis not present

## 2021-12-14 DIAGNOSIS — M6281 Muscle weakness (generalized): Secondary | ICD-10-CM | POA: Diagnosis not present

## 2021-12-14 DIAGNOSIS — R293 Abnormal posture: Secondary | ICD-10-CM | POA: Diagnosis not present

## 2021-12-14 DIAGNOSIS — R278 Other lack of coordination: Secondary | ICD-10-CM | POA: Diagnosis not present

## 2021-12-14 DIAGNOSIS — N189 Chronic kidney disease, unspecified: Secondary | ICD-10-CM | POA: Diagnosis not present

## 2021-12-14 DIAGNOSIS — I5032 Chronic diastolic (congestive) heart failure: Secondary | ICD-10-CM | POA: Diagnosis not present

## 2021-12-17 DIAGNOSIS — R278 Other lack of coordination: Secondary | ICD-10-CM | POA: Diagnosis not present

## 2021-12-17 DIAGNOSIS — R293 Abnormal posture: Secondary | ICD-10-CM | POA: Diagnosis not present

## 2021-12-17 DIAGNOSIS — N189 Chronic kidney disease, unspecified: Secondary | ICD-10-CM | POA: Diagnosis not present

## 2021-12-17 DIAGNOSIS — M6281 Muscle weakness (generalized): Secondary | ICD-10-CM | POA: Diagnosis not present

## 2021-12-17 DIAGNOSIS — I5032 Chronic diastolic (congestive) heart failure: Secondary | ICD-10-CM | POA: Diagnosis not present

## 2021-12-19 DIAGNOSIS — I5032 Chronic diastolic (congestive) heart failure: Secondary | ICD-10-CM | POA: Diagnosis not present

## 2021-12-19 DIAGNOSIS — R051 Acute cough: Secondary | ICD-10-CM | POA: Diagnosis not present

## 2021-12-19 DIAGNOSIS — F02B0 Dementia in other diseases classified elsewhere, moderate, without behavioral disturbance, psychotic disturbance, mood disturbance, and anxiety: Secondary | ICD-10-CM | POA: Diagnosis not present

## 2021-12-19 DIAGNOSIS — N189 Chronic kidney disease, unspecified: Secondary | ICD-10-CM | POA: Diagnosis not present

## 2021-12-19 DIAGNOSIS — R293 Abnormal posture: Secondary | ICD-10-CM | POA: Diagnosis not present

## 2021-12-19 DIAGNOSIS — K5900 Constipation, unspecified: Secondary | ICD-10-CM | POA: Diagnosis not present

## 2021-12-19 DIAGNOSIS — M6281 Muscle weakness (generalized): Secondary | ICD-10-CM | POA: Diagnosis not present

## 2021-12-19 DIAGNOSIS — I482 Chronic atrial fibrillation, unspecified: Secondary | ICD-10-CM | POA: Diagnosis not present

## 2021-12-19 DIAGNOSIS — R278 Other lack of coordination: Secondary | ICD-10-CM | POA: Diagnosis not present

## 2021-12-19 DIAGNOSIS — G301 Alzheimer's disease with late onset: Secondary | ICD-10-CM | POA: Diagnosis not present

## 2021-12-19 DIAGNOSIS — I509 Heart failure, unspecified: Secondary | ICD-10-CM | POA: Diagnosis not present

## 2021-12-20 DIAGNOSIS — N189 Chronic kidney disease, unspecified: Secondary | ICD-10-CM | POA: Diagnosis not present

## 2021-12-20 DIAGNOSIS — R278 Other lack of coordination: Secondary | ICD-10-CM | POA: Diagnosis not present

## 2021-12-20 DIAGNOSIS — R293 Abnormal posture: Secondary | ICD-10-CM | POA: Diagnosis not present

## 2021-12-20 DIAGNOSIS — I5032 Chronic diastolic (congestive) heart failure: Secondary | ICD-10-CM | POA: Diagnosis not present

## 2021-12-20 DIAGNOSIS — M6281 Muscle weakness (generalized): Secondary | ICD-10-CM | POA: Diagnosis not present

## 2021-12-24 DIAGNOSIS — N189 Chronic kidney disease, unspecified: Secondary | ICD-10-CM | POA: Diagnosis not present

## 2021-12-24 DIAGNOSIS — R293 Abnormal posture: Secondary | ICD-10-CM | POA: Diagnosis not present

## 2021-12-24 DIAGNOSIS — M6281 Muscle weakness (generalized): Secondary | ICD-10-CM | POA: Diagnosis not present

## 2021-12-24 DIAGNOSIS — I5032 Chronic diastolic (congestive) heart failure: Secondary | ICD-10-CM | POA: Diagnosis not present

## 2021-12-24 DIAGNOSIS — R278 Other lack of coordination: Secondary | ICD-10-CM | POA: Diagnosis not present

## 2021-12-25 DIAGNOSIS — M6281 Muscle weakness (generalized): Secondary | ICD-10-CM | POA: Diagnosis not present

## 2021-12-25 DIAGNOSIS — R278 Other lack of coordination: Secondary | ICD-10-CM | POA: Diagnosis not present

## 2021-12-25 DIAGNOSIS — I5032 Chronic diastolic (congestive) heart failure: Secondary | ICD-10-CM | POA: Diagnosis not present

## 2021-12-25 DIAGNOSIS — N189 Chronic kidney disease, unspecified: Secondary | ICD-10-CM | POA: Diagnosis not present

## 2021-12-25 DIAGNOSIS — R293 Abnormal posture: Secondary | ICD-10-CM | POA: Diagnosis not present

## 2021-12-26 DIAGNOSIS — R293 Abnormal posture: Secondary | ICD-10-CM | POA: Diagnosis not present

## 2021-12-26 DIAGNOSIS — R278 Other lack of coordination: Secondary | ICD-10-CM | POA: Diagnosis not present

## 2021-12-26 DIAGNOSIS — I5032 Chronic diastolic (congestive) heart failure: Secondary | ICD-10-CM | POA: Diagnosis not present

## 2021-12-26 DIAGNOSIS — N189 Chronic kidney disease, unspecified: Secondary | ICD-10-CM | POA: Diagnosis not present

## 2021-12-26 DIAGNOSIS — M6281 Muscle weakness (generalized): Secondary | ICD-10-CM | POA: Diagnosis not present

## 2021-12-27 DIAGNOSIS — N189 Chronic kidney disease, unspecified: Secondary | ICD-10-CM | POA: Diagnosis not present

## 2021-12-27 DIAGNOSIS — R293 Abnormal posture: Secondary | ICD-10-CM | POA: Diagnosis not present

## 2021-12-27 DIAGNOSIS — I5032 Chronic diastolic (congestive) heart failure: Secondary | ICD-10-CM | POA: Diagnosis not present

## 2021-12-27 DIAGNOSIS — M6281 Muscle weakness (generalized): Secondary | ICD-10-CM | POA: Diagnosis not present

## 2021-12-27 DIAGNOSIS — R278 Other lack of coordination: Secondary | ICD-10-CM | POA: Diagnosis not present

## 2022-01-01 DIAGNOSIS — R278 Other lack of coordination: Secondary | ICD-10-CM | POA: Diagnosis not present

## 2022-01-01 DIAGNOSIS — M6281 Muscle weakness (generalized): Secondary | ICD-10-CM | POA: Diagnosis not present

## 2022-01-01 DIAGNOSIS — R293 Abnormal posture: Secondary | ICD-10-CM | POA: Diagnosis not present

## 2022-01-01 DIAGNOSIS — N189 Chronic kidney disease, unspecified: Secondary | ICD-10-CM | POA: Diagnosis not present

## 2022-01-01 DIAGNOSIS — I5032 Chronic diastolic (congestive) heart failure: Secondary | ICD-10-CM | POA: Diagnosis not present

## 2022-01-03 DIAGNOSIS — R293 Abnormal posture: Secondary | ICD-10-CM | POA: Diagnosis not present

## 2022-01-03 DIAGNOSIS — M6281 Muscle weakness (generalized): Secondary | ICD-10-CM | POA: Diagnosis not present

## 2022-01-03 DIAGNOSIS — N189 Chronic kidney disease, unspecified: Secondary | ICD-10-CM | POA: Diagnosis not present

## 2022-01-03 DIAGNOSIS — I5032 Chronic diastolic (congestive) heart failure: Secondary | ICD-10-CM | POA: Diagnosis not present

## 2022-01-03 DIAGNOSIS — R278 Other lack of coordination: Secondary | ICD-10-CM | POA: Diagnosis not present

## 2022-01-08 DIAGNOSIS — R293 Abnormal posture: Secondary | ICD-10-CM | POA: Diagnosis not present

## 2022-01-08 DIAGNOSIS — R278 Other lack of coordination: Secondary | ICD-10-CM | POA: Diagnosis not present

## 2022-01-08 DIAGNOSIS — I5032 Chronic diastolic (congestive) heart failure: Secondary | ICD-10-CM | POA: Diagnosis not present

## 2022-01-08 DIAGNOSIS — N189 Chronic kidney disease, unspecified: Secondary | ICD-10-CM | POA: Diagnosis not present

## 2022-01-08 DIAGNOSIS — M6281 Muscle weakness (generalized): Secondary | ICD-10-CM | POA: Diagnosis not present

## 2022-01-10 DIAGNOSIS — N189 Chronic kidney disease, unspecified: Secondary | ICD-10-CM | POA: Diagnosis not present

## 2022-01-10 DIAGNOSIS — M6281 Muscle weakness (generalized): Secondary | ICD-10-CM | POA: Diagnosis not present

## 2022-01-10 DIAGNOSIS — R278 Other lack of coordination: Secondary | ICD-10-CM | POA: Diagnosis not present

## 2022-01-10 DIAGNOSIS — R293 Abnormal posture: Secondary | ICD-10-CM | POA: Diagnosis not present

## 2022-01-10 DIAGNOSIS — I5032 Chronic diastolic (congestive) heart failure: Secondary | ICD-10-CM | POA: Diagnosis not present

## 2022-02-09 DEATH — deceased

## 2022-02-18 ENCOUNTER — Ambulatory Visit: Payer: Medicare Other | Admitting: Oncology

## 2022-05-09 ENCOUNTER — Ambulatory Visit: Payer: Medicare Other | Admitting: Internal Medicine

## 2024-02-12 IMAGING — DX DG CHEST 1V PORT
1 series · 1 of 1 positions shown · non-contrast
Comparison: 10/31/2021 and older exams.
COMPARISON: 10/31/2021 and older exams.

Addendum:
CLINICAL DATA: Tachycardia.  Atrial fibrillation.  Follow-up exam.

EXAM:
PORTABLE CHEST 1 VIEW

[chest ap]
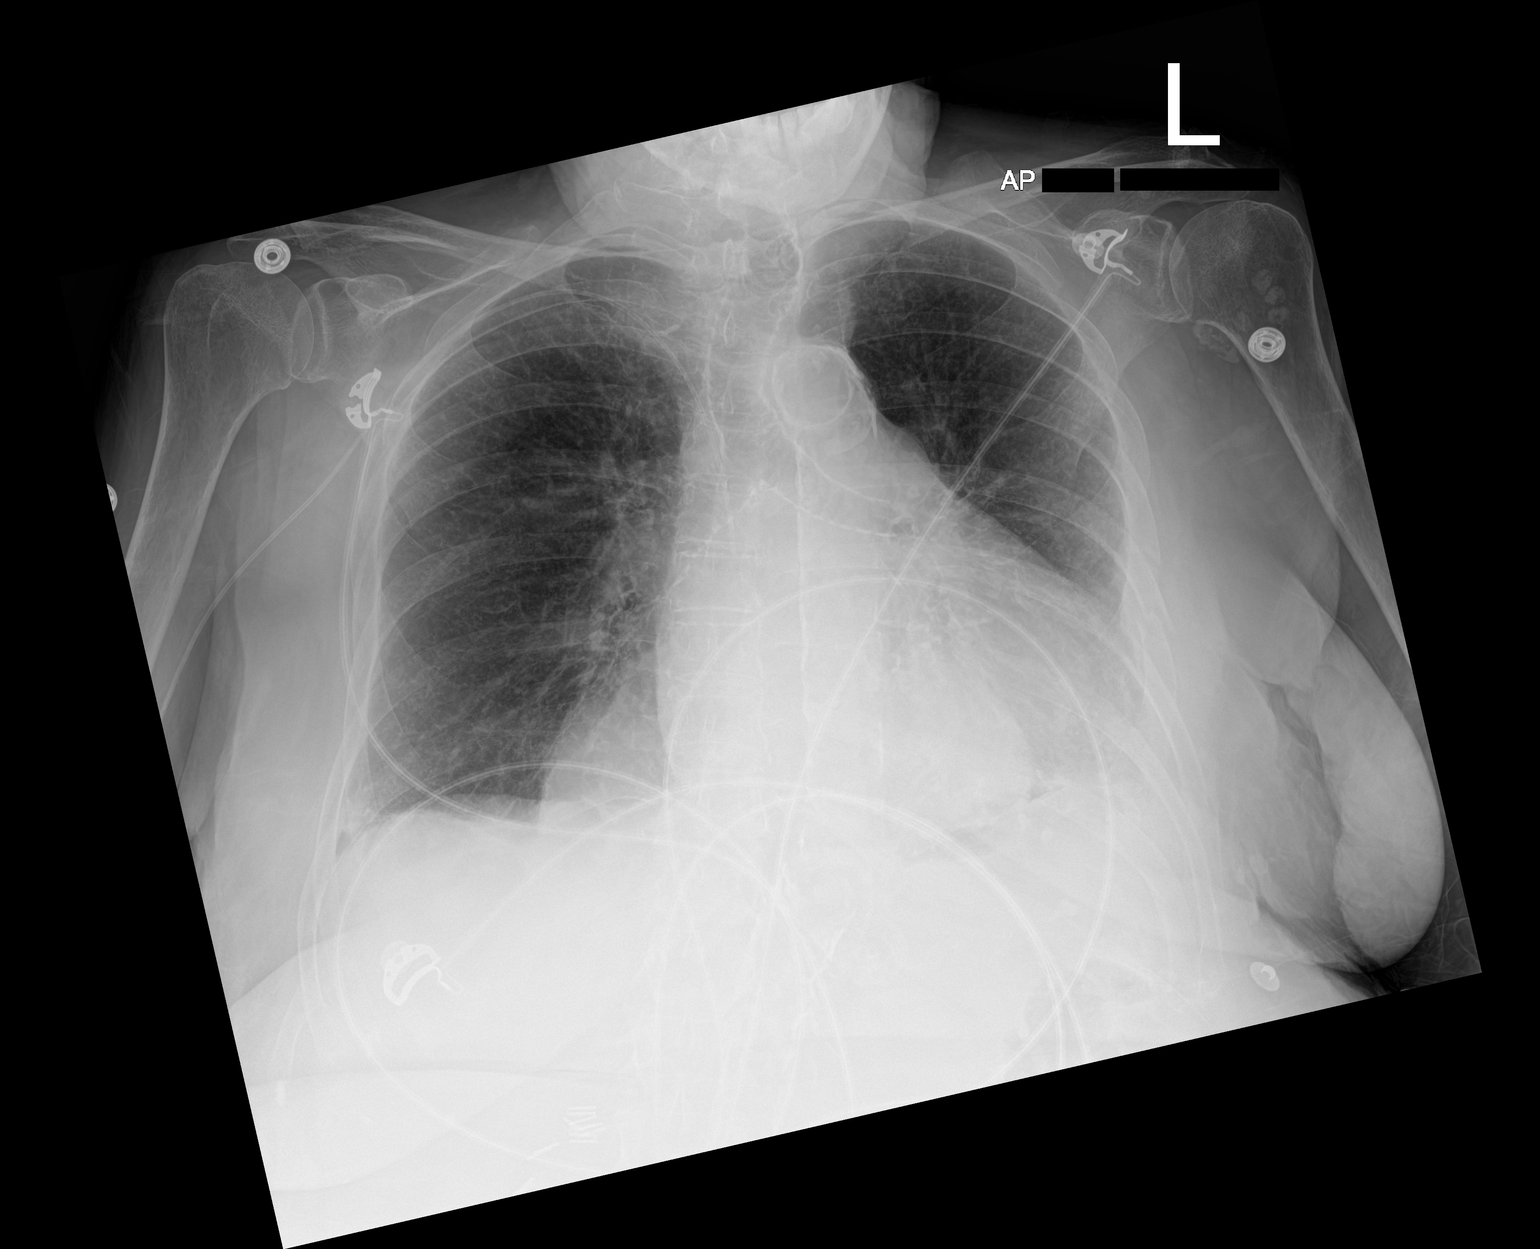

[1 of 1 positions shown; findings below may reference images not displayed]

FINDINGS: Mild enlargement of the cardiopericardial silhouette. Large hiatal
hernia superimposed on the cardiac silhouette, stable. No
mediastinal or hilar masses.

Mild lung base opacities, left greater than right, consistent with
atelectasis or scarring. Lungs otherwise clear. No convincing
pleural effusion. No pneumothorax.

Left-sided rib fractures, at least the left lateral 6, seventh and
eighth ribs, which appear recent.
IMPRESSION: 1. No acute cardiopulmonary disease.
2. Stable cardiomegaly.  Stable large hiatal hernia.
3. Left-sided rib fractures. These are stable compared to the CT
from 09/14/2021.

ADDENDUM:
New PICC line placement. On the current exam there is a right-sided
PICC. Tip projects in the lower superior vena cava, well positioned.

*** End of Addendum ***
FINDINGS: Mild enlargement of the cardiopericardial silhouette. Large hiatal
hernia superimposed on the cardiac silhouette, stable. No
mediastinal or hilar masses.

Mild lung base opacities, left greater than right, consistent with
atelectasis or scarring. Lungs otherwise clear. No convincing
pleural effusion. No pneumothorax.

Left-sided rib fractures, at least the left lateral 6, seventh and
eighth ribs, which appear recent.
IMPRESSION: 1. No acute cardiopulmonary disease.
2. Stable cardiomegaly.  Stable large hiatal hernia.
3. Left-sided rib fractures. These are stable compared to the CT
from 09/14/2021.
